# Patient Record
Sex: Male | Born: 1970 | Race: White | Hispanic: Yes | Marital: Married | Smoking: Former smoker
Health system: Southern US, Community
[De-identification: ages and names within clinical notes are randomized; demographics above are authoritative.]

## PROBLEM LIST (undated history)

## (undated) DIAGNOSIS — H35039 Hypertensive retinopathy, unspecified eye: Secondary | ICD-10-CM

## (undated) DIAGNOSIS — H269 Unspecified cataract: Secondary | ICD-10-CM

## (undated) DIAGNOSIS — E139 Other specified diabetes mellitus without complications: Secondary | ICD-10-CM

## (undated) DIAGNOSIS — I10 Essential (primary) hypertension: Secondary | ICD-10-CM

## (undated) DIAGNOSIS — N189 Chronic kidney disease, unspecified: Secondary | ICD-10-CM

## (undated) DIAGNOSIS — E11319 Type 2 diabetes mellitus with unspecified diabetic retinopathy without macular edema: Secondary | ICD-10-CM

## (undated) HISTORY — DX: Unspecified cataract: H26.9

## (undated) HISTORY — DX: Type 2 diabetes mellitus with unspecified diabetic retinopathy without macular edema: E11.319

## (undated) HISTORY — DX: Hypertensive retinopathy, unspecified eye: H35.039

## (undated) HISTORY — DX: Essential (primary) hypertension: I10

## (undated) HISTORY — PX: MULTIPLE TOOTH EXTRACTIONS: SHX2053

## (undated) HISTORY — DX: Other specified diabetes mellitus without complications: E13.9

## (undated) HISTORY — DX: Chronic kidney disease, unspecified: N18.9

## (undated) NOTE — *Deleted (*Deleted)
[facility] Note  01/31/2020     CHIEF COMPLAINT Patient presents for No chief complaint on file.   HISTORY OF PRESENT ILLNESS: Philip Wilson is a 50 y.o. male who presents to the clinic today for:   pt states no new health or vision concerns  Referring physician: Lillard Anes, MD [address] [place],  [place] [address]  HISTORICAL INFORMATION:   Selected notes from the MEDICAL RECORD NUMBER Referred by Dr. Ricki Sikkema for diabetic retinopathy OU    CURRENT MEDICATIONS: No current outpatient medications on file. (Ophthalmic Drugs)   Current Facility-Administered Medications (Ophthalmic Drugs)  Medication Route  . aflibercept (EYLEA) SOLN 2 mg Intravitreal  . aflibercept (EYLEA) SOLN 2 mg Intravitreal  . aflibercept (EYLEA) SOLN 2 mg Intravitreal  . aflibercept (EYLEA) SOLN 2 mg Intravitreal  . aflibercept (EYLEA) SOLN 2 mg Intravitreal  . aflibercept (EYLEA) SOLN 2 mg Intravitreal  . aflibercept (EYLEA) SOLN 2 mg Intravitreal  . aflibercept (EYLEA) SOLN 2 mg Intravitreal  . aflibercept (EYLEA) SOLN 2 mg Intravitreal  . aflibercept (EYLEA) SOLN 2 mg Intravitreal  . aflibercept (EYLEA) SOLN 2 mg Intravitreal   Current Outpatient Medications (Other)  Medication Sig  . ACCU-CHEK GUIDE test strip every morning. Use to test fasting blood glucose  . AgaMatrix Ultra-Thin Lancets MISC 200 each by Misc.(Non-Drug; Combo Route) route 4 times daily. One touch delica  . aspirin 81 MG EC tablet Take by mouth.  Marland Kitchen atorvastatin (LIPITOR) 40 MG tablet TAKE 1 TABLET BY MOUTH DAILY  . B-D UF III MINI PEN NEEDLES 31G X 5 MM MISC   . Blood Glucose Monitoring Suppl (FIFTY50 GLUCOSE METER 2.0) w/Device KIT 1 each by Other route 4 times daily. Use as instructed one touch ultra  . cloNIDine (CATAPRES) 0.1 MG tablet Take 0.1 mg by mouth 2 (two) times daily.   . dapsone 25 MG tablet Take 50 mg by mouth daily.  . fluconazole (DIFLUCAN) 50  MG tablet   . furosemide (LASIX) 40 MG tablet Take 40 mg by mouth daily.  Marland Kitchen glimepiride (AMARYL) 4 MG tablet Take 4 mg by mouth daily with breakfast.   . HYDROcodone-acetaminophen (NORCO/VICODIN) 5-325 MG tablet Take 1 tablet by mouth every 4 (four) hours as needed for moderate pain.  . Insulin Glargine (BASAGLAR KWIKPEN) 100 UNIT/ML Inject into the skin.  Marland Kitchen insulin lispro (HUMALOG) 100 UNIT/ML injection Inject 5 units with breakfast and dinner and 4 units with lunch plus sliding scale as follows: BG 100-150 (1 unit); 151-200 (3 unit); 201-250 (4 unit); 251-300 (5 unit); 251-300 (6 unit); 351-400 (8 unit)  . K Phos Mono-Sod Phos Di & Mono (PHOSPHA 250 NEUTRAL) 155-852-130 MG TABS   . labetalol (NORMODYNE) 200 MG tablet Take 200 mg by mouth 3 (three) times daily.   Marland Kitchen lisinopril (PRINIVIL,ZESTRIL) 20 MG tablet Take 20 mg by mouth 2 (two) times a day.   Marland Kitchen LOKELMA 10 g PACK packet Take 1 packet by mouth daily.  . magnesium oxide (MAG-OX) 400 MG tablet Take 1 tablet by mouth 2 (two) times daily.  . metFORMIN (GLUCOPHAGE) 1000 MG tablet Take 1,000 mg by mouth daily.  (Patient not taking: Reported on 09/20/2019)  . metFORMIN (GLUCOPHAGE-XR) 500 MG 24 hr tablet Take 500 mg by mouth 2 (two) times daily.  . Multiple Vitamin (MULTIVITAMIN WITH MINERALS) TABS tablet Take 1 tablet by mouth daily.  . Multiple Vitamin (MULTIVITAMIN) capsule Take by mouth. (Patient not taking: Reported  on 09/20/2019)  . mycophenolate (MYFORTIC) 180 MG EC tablet Take by mouth.  Marland Kitchen NIFEdipine (PROCARDIA-XL/NIFEDICAL-XL) 30 MG 24 hr tablet Take 30 mg by mouth daily.  Marland Kitchen NOVOLOG FLEXPEN 100 UNIT/ML FlexPen   . oxyCODONE (OXY IR/ROXICODONE) 5 MG immediate release tablet Take 5 mg by mouth every 4 (four) hours.  . pantoprazole (PROTONIX) 40 MG tablet Take by mouth.  . predniSONE (DELTASONE) 5 MG tablet Take by mouth.  . sodium bicarbonate 650 MG tablet Take by mouth.  . sulfamethoxazole-trimethoprim (BACTRIM) 400-80 MG tablet   .  Tacrolimus ER 1 MG TB24 Take by mouth.  . valGANciclovir (VALCYTE) 450 MG tablet Take by mouth.   Current Facility-Administered Medications (Other)  Medication Route  . Bevacizumab (AVASTIN) SOLN 1.25 mg Intravitreal  . Bevacizumab (AVASTIN) SOLN 1.25 mg Intravitreal  . Bevacizumab (AVASTIN) SOLN 1.25 mg Intravitreal  . Bevacizumab (AVASTIN) SOLN 1.25 mg Intravitreal  . Bevacizumab (AVASTIN) SOLN 1.25 mg Intravitreal  . Bevacizumab (AVASTIN) SOLN 1.25 mg Intravitreal  . Bevacizumab (AVASTIN) SOLN 1.25 mg Intravitreal      REVIEW OF SYSTEMS:    ALLERGIES No Known Allergies  PAST MEDICAL HISTORY Past Medical History:  Diagnosis Date  . Cataract    OU  . Chronic kidney disease 05/04/2018  . Diabetes 1.5, managed as type 2 ([place])   . Diabetic retinopathy ([place])    NPDR OU  . Hypertension   . Hypertensive retinopathy    OU   Past Surgical History:  Procedure Laterality Date  . AV FISTULA PLACEMENT Left 09/28/2018   Procedure: Creation of Left arm Radiocephalic ARTERIOVENOUS (AV) FISTULA;  Surgeon: Marty Heck, MD;  Location: [facility];  Service: Vascular;  Laterality: Left;  Marland Kitchen MULTIPLE TOOTH EXTRACTIONS      FAMILY HISTORY Family History  Problem Relation Age of Onset  . Heart failure Mother     SOCIAL HISTORY Social History   Tobacco Use  . Smoking status: Former Research scientist (life sciences)  . Smokeless tobacco: Never Used  . Tobacco comment: quit 20 years ago  Vaping Use  . Vaping Use: Never used  Substance Use Topics  . Alcohol use: Not Currently  . Drug use: Never         OPHTHALMIC EXAM:  Not recorded     IMAGING AND PROCEDURES  Imaging and Procedures for @TODAY @           ASSESSMENT/PLAN:    ICD-10-CM   1. Severe nonproliferative diabetic retinopathy of both eyes with macular edema associated with type 2 diabetes mellitus ([place])  [id number]   2. Retinal edema  H35.81 OCT, Retina - OU - Both Eyes  3. Essential hypertension  I10   4. Hypertensive  retinopathy of both eyes  H35.033   5. Combined forms of age-related cataract of both eyes  H25.813     1, 2. Severe Non-proliferative diabetic retinopathy, both eyes  - delayed follow up from Nov 2020 to April 2021 - 5 months instead of 4 weeks due to having a kidney transplant in December 2020  - initial exam showed massive central DME OU and scattered IRH and IRMA  - FA 6.5.19 with patches of capillary nonperfusion; extensive Mas with late leakage OU; no frank NV  - FA 01.02.20, shows improvement in late leaking microaneurysms OU  - S/P IVA OS #1 (06.05.19), #2 (07.08.19), #3 (07.08.19), #4 (09.03.19)  - S/P IVA OD #1 (06.07.19), #2 (07.08.19), #3 (07.08.19)  - S/P IVE OD #1 (09.03.19), #2 (10.03.19), #3 (11.04.19), #4 (12.02.19), #5 (  01.02.20), #6 (02.07.20), #7 (03.16.20), #8 (05.20.20), #9 (06.22.20), #10 (07.27.20), #11(09.09.10), # 12 (10.28.20), #13 (11.25.20), #14 (04.06.21) - sample, #15 (05.04.21) - sample, #16 (06.18.21), #17 (07.30.21), #18 (09.10.21)  - S/P IVE OS #1 (10.03.19), #2 (11.04.19), #3 (12.02.19), #4 (01.02.20), #5 (02.07.20), #6 (03.16.20), #7 (05.20.20), #8 (06.22.20),#9 (07.27.20), #10 (09.09.20), #11 (10.28.20), #12 (11.25.20)  - OCT shows OD shows persistent IRF, +ERM; OS: mild interval improvement in IRF ST macula  - BCVA: OD 20/40 (down from 20/30),  OS 20/25  - recommend IVE OD #19 today, 10.29.21, w/ dec in interval to 4-5 wks   - will hold off on injection OS again today -- IRF improving without therapy and BCVA 20/25  - pt in agreement  - RBA of procedure discussed, questions answered  - informed consent obtained and signed  - see procedure note -- tolerated well  - E[facility]a paperwork and benefits investigation started on 08.05.19 -- approved for 2021   - f/u in 4-5 weeks -- DFE/OCT/possible IVE  3,4. Hypertensive retinopathy OU  - discussed importance of tight BP control  - had some Bps in hypertensive emergency range (200s / 110s)  - BP improved post  kidney transplant  - discussed likely contribution to DME  - monitor             - BP management per nephrology, PCP and cardiology  5. Combined form age-related cataract OU-   - The symptoms of cataract, surgical options, and treatments and risks were discussed with patient.  - discussed diagnosis and progression  - not yet visually significant  - monitor for now   Ophthalmic Meds Ordered this visit:  No orders of the defined types were placed in this encounter.      No follow-ups on file.  There are no Patient Instructions on file for this visit.   Explained the diagnoses, plan, and follow up with the patient and they expressed understanding.  Patient expressed understanding of the importance of proper follow up care.   This document serves as a record of services personally performed by Gardiner Sleeper, MD, PhD. It was created on their behalf by San Jetty. Owens Shark, OA an ophthalmic technician. The creation of this record is the provider's dictation and/or activities during the visit.    Electronically signed by: San Jetty. Owens Shark, [place] 10.25.2021 12:48 PM  Gardiner Sleeper, M.D., Ph.D. Diseases & Surgery of the Retina and Vitreous [facility]: M myopia (nearsighted); A astigmatism; H hyperopia (farsighted); P presbyopia; Mrx spectacle prescription;  CTL contact lenses; OD right eye; OS left eye; OU both eyes  XT exotropia; ET esotropia; PEK punctate epithelial keratitis; PEE punctate epithelial erosions; DES dry eye syndrome; MGD meibomian gland dysfunction; ATs artificial tears; PFAT's preservative free artificial tears; [place] nuclear sclerotic cataract; PSC posterior subcapsular cataract; ERM epi-retinal membrane; PVD posterior vitreous detachment; RD retinal detachment; DM diabetes mellitus; DR diabetic retinopathy; NPDR non-proliferative diabetic retinopathy; PDR proliferative diabetic retinopathy; CSME clinically significant macular edema; DME  diabetic macular edema; dbh dot blot hemorrhages; CWS cotton wool spot; POAG primary open angle glaucoma; C/D cup-to-disc ratio; HVF humphrey visual field; GVF goldmann visual field; OCT optical coherence tomography; IOP intraocular pressure; BRVO Branch retinal vein occlusion; CRVO central retinal vein occlusion; CRAO central retinal artery occlusion; BRAO branch retinal artery occlusion; RT retinal tear; SB scleral buckle; PPV pars plana vitrectomy; VH Vitreous hemorrhage; PRP panretinal laser photocoagulation; IVK intravitreal kenalog; VMT vitreomacular traction; MH Macular  hole;  NVD neovascularization of the disc; NVE neovascularization elsewhere; AREDS age related eye disease study; ARMD age related macular degeneration; POAG primary open angle glaucoma; EBMD epithelial/anterior basement membrane dystrophy; ACIOL anterior chamber intraocular lens; IOL intraocular lens; PCIOL posterior chamber intraocular lens; Phaco/IOL phacoemulsification with intraocular lens placement; [place] photorefractive keratectomy; LASIK laser assisted in situ keratomileusis; HTN hypertension; DM diabetes mellitus; COPD chronic obstructive pulmonary disease

## (undated) NOTE — *Deleted (*Deleted)
[facility] Note  01/17/2020     CHIEF COMPLAINT Patient presents for No chief complaint on file.   HISTORY OF PRESENT ILLNESS: Philip Wilson is a 85 y.o. male who presents to the clinic today for:   pt states no new health or vision concerns  Referring physician: Lillard Anes, MD [address] [place],  [place] [address]  HISTORICAL INFORMATION:   Selected notes from the MEDICAL RECORD NUMBER Referred by Dr. Ricki Morimoto for diabetic retinopathy OU    CURRENT MEDICATIONS: No current outpatient medications on file. (Ophthalmic Drugs)   Current Facility-Administered Medications (Ophthalmic Drugs)  Medication Route  . aflibercept (EYLEA) SOLN 2 mg Intravitreal  . aflibercept (EYLEA) SOLN 2 mg Intravitreal  . aflibercept (EYLEA) SOLN 2 mg Intravitreal  . aflibercept (EYLEA) SOLN 2 mg Intravitreal  . aflibercept (EYLEA) SOLN 2 mg Intravitreal  . aflibercept (EYLEA) SOLN 2 mg Intravitreal  . aflibercept (EYLEA) SOLN 2 mg Intravitreal  . aflibercept (EYLEA) SOLN 2 mg Intravitreal  . aflibercept (EYLEA) SOLN 2 mg Intravitreal  . aflibercept (EYLEA) SOLN 2 mg Intravitreal  . aflibercept (EYLEA) SOLN 2 mg Intravitreal   Current Outpatient Medications (Other)  Medication Sig  . ACCU-CHEK GUIDE test strip every morning. Use to test fasting blood glucose  . AgaMatrix Ultra-Thin Lancets MISC 200 each by Misc.(Non-Drug; Combo Route) route 4 times daily. One touch delica  . aspirin 81 MG EC tablet Take by mouth.  Marland Kitchen atorvastatin (LIPITOR) 40 MG tablet TAKE 1 TABLET BY MOUTH DAILY  . B-D UF III MINI PEN NEEDLES 31G X 5 MM MISC   . Blood Glucose Monitoring Suppl (FIFTY50 GLUCOSE METER 2.0) w/Device KIT 1 each by Other route 4 times daily. Use as instructed one touch ultra  . cloNIDine (CATAPRES) 0.1 MG tablet Take 0.1 mg by mouth 2 (two) times daily.   . dapsone 25 MG tablet Take 50 mg by mouth daily.  . fluconazole (DIFLUCAN) 50  MG tablet   . furosemide (LASIX) 40 MG tablet Take 40 mg by mouth daily.  Marland Kitchen glimepiride (AMARYL) 4 MG tablet Take 4 mg by mouth daily with breakfast.   . HYDROcodone-acetaminophen (NORCO/VICODIN) 5-325 MG tablet Take 1 tablet by mouth every 4 (four) hours as needed for moderate pain.  . Insulin Glargine (BASAGLAR KWIKPEN) 100 UNIT/ML Inject into the skin.  Marland Kitchen insulin lispro (HUMALOG) 100 UNIT/ML injection Inject 5 units with breakfast and dinner and 4 units with lunch plus sliding scale as follows: BG 100-150 (1 unit); 151-200 (3 unit); 201-250 (4 unit); 251-300 (5 unit); 251-300 (6 unit); 351-400 (8 unit)  . K Phos Mono-Sod Phos Di & Mono (PHOSPHA 250 NEUTRAL) 155-852-130 MG TABS   . labetalol (NORMODYNE) 200 MG tablet Take 200 mg by mouth 3 (three) times daily.   Marland Kitchen lisinopril (PRINIVIL,ZESTRIL) 20 MG tablet Take 20 mg by mouth 2 (two) times a day.   Marland Kitchen LOKELMA 10 g PACK packet Take 1 packet by mouth daily.  . magnesium oxide (MAG-OX) 400 MG tablet Take 1 tablet by mouth 2 (two) times daily.  . metFORMIN (GLUCOPHAGE) 1000 MG tablet Take 1,000 mg by mouth daily.  (Patient not taking: Reported on 09/20/2019)  . metFORMIN (GLUCOPHAGE-XR) 500 MG 24 hr tablet Take 500 mg by mouth 2 (two) times daily.  . Multiple Vitamin (MULTIVITAMIN WITH MINERALS) TABS tablet Take 1 tablet by mouth daily.  . Multiple Vitamin (MULTIVITAMIN) capsule Take by mouth. (Patient not taking: Reported  on 09/20/2019)  . mycophenolate (MYFORTIC) 180 MG EC tablet Take by mouth.  Marland Kitchen NIFEdipine (PROCARDIA-XL/NIFEDICAL-XL) 30 MG 24 hr tablet Take 30 mg by mouth daily.  Marland Kitchen NOVOLOG FLEXPEN 100 UNIT/ML FlexPen   . oxyCODONE (OXY IR/ROXICODONE) 5 MG immediate release tablet Take 5 mg by mouth every 4 (four) hours.  . pantoprazole (PROTONIX) 40 MG tablet Take by mouth.  . predniSONE (DELTASONE) 5 MG tablet Take by mouth.  . sodium bicarbonate 650 MG tablet Take by mouth.  . sulfamethoxazole-trimethoprim (BACTRIM) 400-80 MG tablet   .  Tacrolimus ER 1 MG TB24 Take by mouth.  . valGANciclovir (VALCYTE) 450 MG tablet Take by mouth.   Current Facility-Administered Medications (Other)  Medication Route  . Bevacizumab (AVASTIN) SOLN 1.25 mg Intravitreal  . Bevacizumab (AVASTIN) SOLN 1.25 mg Intravitreal  . Bevacizumab (AVASTIN) SOLN 1.25 mg Intravitreal  . Bevacizumab (AVASTIN) SOLN 1.25 mg Intravitreal  . Bevacizumab (AVASTIN) SOLN 1.25 mg Intravitreal  . Bevacizumab (AVASTIN) SOLN 1.25 mg Intravitreal  . Bevacizumab (AVASTIN) SOLN 1.25 mg Intravitreal      REVIEW OF SYSTEMS:    ALLERGIES No Known Allergies  PAST MEDICAL HISTORY Past Medical History:  Diagnosis Date  . Cataract    OU  . Chronic kidney disease 05/04/2018  . Diabetes 1.5, managed as type 2 ([place])   . Diabetic retinopathy ([place])    NPDR OU  . Hypertension   . Hypertensive retinopathy    OU   Past Surgical History:  Procedure Laterality Date  . AV FISTULA PLACEMENT Left 09/28/2018   Procedure: Creation of Left arm Radiocephalic ARTERIOVENOUS (AV) FISTULA;  Surgeon: Marty Heck, MD;  Location: [facility];  Service: Vascular;  Laterality: Left;  Marland Kitchen MULTIPLE TOOTH EXTRACTIONS      FAMILY HISTORY Family History  Problem Relation Age of Onset  . Heart failure Mother     SOCIAL HISTORY Social History   Tobacco Use  . Smoking status: Former Research scientist (life sciences)  . Smokeless tobacco: Never Used  . Tobacco comment: quit 20 years ago  Vaping Use  . Vaping Use: Never used  Substance Use Topics  . Alcohol use: Not Currently  . Drug use: Never         OPHTHALMIC EXAM:  Not recorded     IMAGING AND PROCEDURES  Imaging and Procedures for @TODAY @           ASSESSMENT/PLAN:    ICD-10-CM   1. Severe nonproliferative diabetic retinopathy of both eyes with macular edema associated with type 2 diabetes mellitus ([facility])  [id number]   2. Retinal edema  H35.81 OCT, Retina - OU - Both Eyes  3. Essential hypertension  I10   4. Hypertensive  retinopathy of both eyes  H35.033   5. Combined forms of age-related cataract of both eyes  H25.813     1, 2. Severe Non-proliferative diabetic retinopathy, both eyes  - delayed follow up from Nov 2020 to April 2021 - 5 months instead of 4 weeks due to having a kidney transplant in December 2020  - initial exam showed massive central DME OU and scattered IRH and IRMA  - FA 6.5.19 with patches of capillary nonperfusion; extensive Mas with late leakage OU; no frank NV  - FA 01.02.20, shows improvement in late leaking microaneurysms OU  - S/P IVA OS #1 (06.05.19), #2 (07.08.19), #3 (07.08.19), #4 (09.03.19)  - S/P IVA OD #1 (06.07.19), #2 (07.08.19), #3 (07.08.19)  - S/P IVE OD #1 (09.03.19), #2 (10.03.19), #3 (11.04.19), #4 (12.02.19), #5 (  01.02.20), #6 (02.07.20), #7 (03.16.20), #8 (05.20.20), #9 (06.22.20), #10 (07.27.20), #11(09.09.10), # 12 (10.28.20), #13 (11.25.20), #14 (04.06.21) - sample, #15 (05.04.21) - sample, #16 (06.18.21), #17 (07.30.21), #18 (09.10.21)  - S/P IVE OS #1 (10.03.19), #2 (11.04.19), #3 (12.02.19), #4 (01.02.20), #5 (02.07.20), #6 (03.16.20), #7 (05.20.20), #8 (06.22.20),#9 (07.27.20), #10 (09.09.20), #11 (10.28.20), #12 (11.25.20)  - OCT shows OD shows persistent IRF, +ERM; OS: mild interval improvement in IRF ST macula  - BCVA: OD 20/40 (down from 20/30),  OS 20/25  - recommend IVE OD #19 today, 10.15.21, w/ dec in interval to 4-5 wks   - will hold off on injection OS again today -- IRF improving without therapy and BCVA 20/25  - pt in agreement  - RBA of procedure discussed, questions answered  - informed consent obtained and signed  - see procedure note -- tolerated well  - E[facility] paperwork and benefits investigation started on 08.05.19 -- approved for 2021   - f/u in 4-5 weeks -- DFE/OCT/possible IVE  3,4. Hypertensive retinopathy OU  - discussed importance of tight BP control  - had some Bps in hypertensive emergency range (200s / 110s)  - BP improved post  kidney transplant  - discussed likely contribution to DME  - monitor             - BP management per nephrology, PCP and cardiology  5. Combined form age-related cataract OU-   - The symptoms of cataract, surgical options, and treatments and risks were discussed with patient.  - discussed diagnosis and progression  - not yet visually significant  - monitor for now   Ophthalmic Meds Ordered this visit:  No orders of the defined types were placed in this encounter.      No follow-ups on file.  There are no Patient Instructions on file for this visit.   Explained the diagnoses, plan, and follow up with the patient and they expressed understanding.  Patient expressed understanding of the importance of proper follow up care.   This document serves as a record of services personally performed by Gardiner Sleeper, MD, PhD. It was created on their behalf by San Jetty. Owens Shark, OA an ophthalmic technician. The creation of this record is the provider's dictation and/or activities during the visit.    Electronically signed by: San Jetty. Owens Shark, [place] 10.12.2021 2:36 PM  Gardiner Sleeper, M.D., Ph.D. Diseases & Surgery of the Retina and Vitreous [facility]: M myopia (nearsighted); A astigmatism; H hyperopia (farsighted); P presbyopia; Mrx spectacle prescription;  CTL contact lenses; OD right eye; OS left eye; OU both eyes  XT exotropia; ET esotropia; PEK punctate epithelial keratitis; PEE punctate epithelial erosions; DES dry eye syndrome; MGD meibomian gland dysfunction; ATs artificial tears; PFAT's preservative free artificial tears; [place] nuclear sclerotic cataract; PSC posterior subcapsular cataract; ERM epi-retinal membrane; PVD posterior vitreous detachment; RD retinal detachment; DM diabetes mellitus; DR diabetic retinopathy; NPDR non-proliferative diabetic retinopathy; PDR proliferative diabetic retinopathy; CSME clinically significant macular edema; DME  diabetic macular edema; dbh dot blot hemorrhages; CWS cotton wool spot; POAG primary open angle glaucoma; C/D cup-to-disc ratio; HVF humphrey visual field; GVF goldmann visual field; OCT optical coherence tomography; IOP intraocular pressure; BRVO Branch retinal vein occlusion; CRVO central retinal vein occlusion; CRAO central retinal artery occlusion; BRAO branch retinal artery occlusion; RT retinal tear; SB scleral buckle; PPV pars plana vitrectomy; VH Vitreous hemorrhage; PRP panretinal laser photocoagulation; IVK intravitreal kenalog; VMT vitreomacular traction; MH Macular  hole;  NVD neovascularization of the disc; NVE neovascularization elsewhere; AREDS age related eye disease study; ARMD age related macular degeneration; POAG primary open angle glaucoma; EBMD epithelial/anterior basement membrane dystrophy; ACIOL anterior chamber intraocular lens; IOL intraocular lens; PCIOL posterior chamber intraocular lens; Phaco/IOL phacoemulsification with intraocular lens placement; [place] photorefractive keratectomy; LASIK laser assisted in situ keratomileusis; HTN hypertension; DM diabetes mellitus; COPD chronic obstructive pulmonary disease

---

## 2017-09-05 NOTE — Progress Notes (Addendum)
[facility] Note  09/06/2017     CHIEF COMPLAINT Patient presents for Retina Evaluation and Diabetic Eye Exam   HISTORY OF PRESENT ILLNESS: Philip Wilson is a 47 y.o. male who presents to the clinic today for:   HPI    Retina Evaluation    In both eyes.  This started 2 months ago.  Associated Symptoms Floaters.  Negative for Blind Spot, Glare, Shoulder/Hip pain, Fatigue, Jaw Claudication, Photophobia, Distortion, Redness, Scalp Tenderness, Weight Loss, Flashes, Pain, Trauma and Fever.  Context:  distance vision, mid-range vision and near vision.  Treatments tried include no treatments.  I, the attending physician,  performed the HPI with the patient and updated documentation appropriately.          Diabetic Eye Exam    Vision fluctuates with blood sugars.  Associated Symptoms Flashes and Pain.  Negative for Blind Spot, Glare, Shoulder/Hip pain, Fatigue, Jaw Claudication, Photophobia, Distortion, Floaters, Redness, Scalp Tenderness, Weight Loss, Fever and Trauma.  Diabetes characteristics include Type 2.  This started 2 years ago.  Blood sugar level fluctuates.  Last Blood Glucose 95.  Associated Diagnosis Neuropathy.  I, the attending physician,  performed the HPI with the patient and updated documentation appropriately.          Comments    Referral of Dr. Samara Snide for DME. Patient states he had constant floaters Ou for many years "I just ignored them". Pt states Dr. Oren Beckmann told him he had blood behind his eye(unsure of which eye).Denies flashes and ocular pain. Pt has been DM2 x 2 yrs, he was DX with diabetes when he was dx with neuropathy, Bs fluctuates and are controlled by Metformin and glipizide. BS 95 this am. Last A1C 6 one month ago. Denies vit's/gtt's       Last edited by Bernarda Caffey, MD on 09/06/2017  1:12 PM. (History)    Pt states he was referred by Dr. Samara Snide for "some bleeding in my eyes"; Pt states he was seeing Dr. Samara Snide for a routine  exam; Pt states he has noticed a decrease in [place] x 4-6 months; Pt states he has noticed more trouble at near; Pt states CBG has been stable, last A1C was 6; Pt states before he was dx his A1C was 13; Pt states he "goes to get my eyes checked every year and this time my eye doctor saw something";   Referring physician: Phylliss Blakes, OD [address] [place], [place] [address]  HISTORICAL INFORMATION:   Selected notes from the MEDICAL RECORD NUMBER Referred by Dr. Ricki Coombs for diabetic retinopathy OU LEE: 05.28.19 (A. Hager) [BCVA: OD: 20/40 OS: 20/50+2] Ocular Hx-Diabetic Retinopathy PMH-DM    CURRENT MEDICATIONS: No current outpatient medications on file. (Ophthalmic Drugs)   No current facility-administered medications for this visit.  (Ophthalmic Drugs)   Current Outpatient Medications (Other)  Medication Sig  . glimepiride (AMARYL) 4 MG tablet Take by mouth.  Marland Kitchen lisinopril-hydrochlorothiazide (PRINZIDE,ZESTORETIC) 20-12.5 MG tablet   . metFORMIN (GLUCOPHAGE) 1000 MG tablet TK 1 T PO BID   Current Facility-Administered Medications (Other)  Medication Route  . Bevacizumab (AVASTIN) SOLN 1.25 mg Intravitreal      REVIEW OF SYSTEMS: ROS    Positive for: Endocrine, Eyes   Negative for: Constitutional, Gastrointestinal, Neurological, Skin, Genitourinary, Musculoskeletal, HENT, Cardiovascular, Respiratory, Psychiatric, Allergic/Imm, Heme/Lymph   Last edited by Zenovia Jordan, LPN on 10/02/6965  8:93 AM. (History)       ALLERGIES No Known Allergies  PAST MEDICAL HISTORY Past Medical History:  Diagnosis Date  . Diabetes 1.5, managed as type 2 ([place])   . Hypertension    History reviewed. No pertinent surgical history.  FAMILY HISTORY Family History  Problem Relation Age of Onset  . Heart failure Mother     SOCIAL HISTORY Social History   Tobacco Use  . Smoking status: Former Research scientist (life sciences)  . Smokeless tobacco: Never Used  Substance Use Topics  . Alcohol use: Yes     Alcohol/week: 0.6 oz    Types: 1 Cans of beer per week  . Drug use: Never         OPHTHALMIC EXAM:  Base Eye Exam    Visual Acuity (Snellen - Linear)      Right Left   Dist cc 20/40 20/80   Dist ph cc NI NI   Correction:  Glasses       Tonometry (Tonopen, 9:00 AM)      Right Left   Pressure 18 19       Pupils      Dark Light Shape React APD   Right 3 2 Round Brisk None   Left 3 2 Round Brisk None       Visual Fields (Counting fingers)      Left Right    Full Full       Extraocular Movement      Right Left    Full, Ortho Full, Ortho       Neuro/Psych    Oriented x3:  Yes   Mood/Affect:  Normal       Dilation    Both eyes:  1.0% Mydriacyl, Paremyd @ 9:00 AM        Slit Lamp and Fundus Exam    Slit Lamp Exam      Right Left   Lids/Lashes Dermatochalasis - upper lid, mild Meibomian gland dysfunction Dermatochalasis - upper lid, mild Meibomian gland dysfunction   Conjunctiva/Sclera White and quiet White and quiet   Cornea Clear Clear   Anterior Chamber Deep and quiet, no cell or flare Deep and quiet, no cell or flare   Iris Round and dilated to 24mm Round and dilated to 65mm   Lens 1+ Nuclear sclerosis, 2+ Cortical cataract 1+ Nuclear sclerosis, 2+ Cortical cataract   Vitreous Mild Vitreous syneresis Mild Vitreous syneresis, old white VH inferiorly,       Fundus Exam      Right Left   Disc Pink and Sharp Pink and Sharp   C/D Ratio 0.2 0.2   Macula Blunted foveal reflex, central edema, Microaneurysms, Exudates, scattered Intraretinal hemorrhage Blunted foveal reflex, central edema with striae, scattered Microaneurysms, scattered Intraretinal hemorrhage   Vessels Tortuous, AV crossing changes, ? NV inferior to arcades Tortuous, AV crossing changes   Periphery Attached, scattered MAs and DBH - mostly posterior, scattered CWS Attached, scattered MAs and DBH -- mostly posterior          IMAGING AND PROCEDURES  Imaging and Procedures for @TODAY @  OCT,  Retina - OU - Both Eyes       Right Eye Quality was good. Central Foveal Thickness: 558. Progression has no prior data. Findings include abnormal foveal contour, subretinal fluid, intraretinal fluid.   Left Eye Quality was good. Central Foveal Thickness: 816. Progression has no prior data. Findings include abnormal foveal contour, intraretinal fluid, subretinal fluid.   Notes *Images captured and stored on drive  Diagnosis / Impression:  Massive DME OU  Clinical management:  See below  Abbreviations: NFP - Normal foveal profile. CME - cystoid macular edema. PED - pigment epithelial detachment. IRF - intraretinal fluid. SRF - subretinal fluid. EZ - ellipsoid zone. ERM - epiretinal membrane. ORA - outer retinal atrophy. ORT - outer retinal tubulation. SRHM - subretinal hyper-reflective material        Fluorescein Angiography Optos (Transit OS)       Right Eye Progression has no prior data. Early phase findings include microaneurysm, vascular perfusion defect, leakage (IRMA). Mid/Late phase findings include leakage, microaneurysm, vascular perfusion defect (IRMA).   Left Eye Progression has no prior data. Early phase findings include microaneurysm, vascular perfusion defect, leakage (IRMA). Mid/Late phase findings include leakage, microaneurysm, vascular perfusion defect (IRMA).   Notes Images stored on drive;   Impression: Severe NPDR OU OD: extensive leaking microaneurysms OS: extensive microaneurysms with leakage, hyperfluorescence of disc No frank NV OU         Intravitreal Injection, Pharmacologic Agent - OS - Left Eye       Time Out 09/06/2017. 10:36 AM. Confirmed correct patient, procedure, site, and patient consented.   Anesthesia Topical anesthesia was used. Anesthetic medications included Tetracaine 0.5%, Lidocaine 2%.   Procedure Preparation included 5% betadine to ocular surface, eyelid speculum. A supplied needle was used.   Injection: 1.25 mg  Bevacizumab 1.25mg /0.58ml   NDC: 14431-540-08    Lot: [id number]    Expiration Date: 10/25/2017   Route: Intravitreal   Site: Left Eye   Waste: 0 mg  Post-op Post injection exam found visual acuity of at least counting fingers. The patient tolerated the procedure well. There were no complications. The patient received written and verbal post procedure care education.                 ASSESSMENT/PLAN:    ICD-10-CM   1. 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 Retinal edema H35.81 OCT, Retina - OU - Both Eyes    Fluorescein Angiography Optos (Transit OS)  3. Essential hypertension I10   4. Hypertensive retinopathy of both eyes H35.033   5. 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 Severe Non-proliferative diabetic retinopathy, both eyes - The incidence, risk factors for progression, natural history and treatment options for diabetic retinopathy  were discussed with patient.   - The need for close monitoring of blood glucose, blood pressure, and serum lipids, avoiding cigarette or any type of tobacco, and the need for long term follow up was also discussed with patient. - exam shows massive central DME OU and scattered IRH and IRMA - FA with patches of capillary nonperfusion; extensive Mas with late leakage OU - OCT shows diabetic macular edema, both eyes (OS > OD) The natural history, pathology, and characteristics of diabetic macular edema discussed with patient.  A generalized discussion of the major clinical trials concerning treatment of diabetic macular edema (ETDRS, DCT, SCORE, RISE / RIDE, and ongoing DRCR net studies) was completed.  This discussion included mention of the various approaches to treating diabetic macular edema (observation, laser  photocoagulation, anti-VEGF injections with lucentis / Avastin / Eylea, steroid injections with Kenalog / Ozurdex, and intraocular surgery with vitrectomy).  The goal hemoglobin A1C of 6-7 was discussed, as well as importance of smoking cessation and hypertension control.  Need for ongoing treatment and monitoring  were specifically discussed with reference to chronic nature of diabetic macular edema.  - recommend IVA OU, OS first, OD ASAP - pt wishes to proceed - IVA #1 OS today, 06.05.19 - RBA of procedure discussed, questions answered - informed consent obtained and signed - see procedure note -- tolerated well - f/u in 2 days for OS IVA - then 4 week follow up - will plan for bilateral injections at subsequent visits   3,4. Hypertensive retinopathy OU - discussed importance of tight BP control - monitor  5. 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 - discussed diagnosis and progression - not yet visually significant - monitor for now   Ophthalmic Meds Ordered this visit:  Meds ordered this encounter  Medications  . Bevacizumab (AVASTIN) SOLN 1.25 mg       Return in about 2 days (around 09/08/2017) for IVA OD.  There are no Patient Instructions on file for this visit.   Explained the diagnoses, plan, and follow up with the patient and they expressed understanding.  Patient expressed understanding of the importance of proper follow up care.   This document serves as a record of services personally performed by Gardiner Sleeper, MD, PhD. It was created on their behalf by Ernest Mallick, OA, an ophthalmic assistant. The creation of this record is the provider's dictation and/or activities during the visit.    Electronically signed by: Ernest Mallick, OA  06.04.2019 3:22 PM   This document serves as a record of services personally performed by Gardiner Sleeper, MD, PhD. It was created on  their behalf by Catha Brow, [place], a certified ophthalmic assistant. The creation of this record is the provider's dictation and/or activities during the visit.  Electronically signed by: Catha Brow, [place]  06.05.19 3:22 PM     Gardiner Sleeper, M.D., Ph.D. Diseases & Surgery of the Retina and Vitreous [facility]  I have reviewed the above documentation for accuracy and completeness, and I agree with the above. Gardiner Sleeper, M.D., Ph.D. 09/07/17 3:22 PM     Abbreviations: M myopia (nearsighted); A astigmatism; H hyperopia (farsighted); P presbyopia; Mrx spectacle prescription;  CTL contact lenses; OD right eye; OS left eye; OU both eyes  XT exotropia; ET esotropia; PEK punctate epithelial keratitis; PEE punctate epithelial erosions; DES dry eye syndrome; MGD meibomian gland dysfunction; ATs artificial tears; PFAT's preservative free artificial tears; [place] nuclear sclerotic cataract; PSC posterior subcapsular cataract; ERM epi-retinal membrane; PVD posterior vitreous detachment; RD retinal detachment; DM diabetes mellitus; DR diabetic retinopathy; NPDR non-proliferative diabetic retinopathy; PDR proliferative diabetic retinopathy; CSME clinically significant macular edema; DME diabetic macular edema; dbh dot blot hemorrhages; CWS cotton wool spot; POAG primary open angle glaucoma; C/D cup-to-disc ratio; HVF humphrey visual field; GVF goldmann visual field; OCT optical coherence tomography; IOP intraocular pressure; BRVO Branch retinal vein occlusion; CRVO central retinal vein occlusion; CRAO central retinal artery occlusion; BRAO branch retinal artery occlusion; RT retinal tear; SB scleral buckle; PPV pars plana vitrectomy; VH Vitreous hemorrhage; PRP panretinal laser photocoagulation; IVK intravitreal kenalog; VMT vitreomacular traction; MH Macular hole;  NVD neovascularization of the disc; NVE neovascularization elsewhere; AREDS age related eye disease study; ARMD age  related macular degeneration; POAG primary open angle glaucoma; EBMD epithelial/anterior basement membrane dystrophy; ACIOL anterior chamber intraocular lens; IOL intraocular lens; PCIOL posterior chamber intraocular lens; Phaco/IOL phacoemulsification with intraocular lens placement; PRK photorefractive keratectomy; LASIK laser  assisted in situ keratomileusis; HTN hypertension; DM diabetes mellitus; COPD chronic obstructive pulmonary disease

## 2017-09-06 ENCOUNTER — Encounter (INDEPENDENT_AMBULATORY_CARE_PROVIDER_SITE_OTHER): Payer: Self-pay | Admitting: Ophthalmology

## 2017-09-06 ENCOUNTER — Ambulatory Visit (INDEPENDENT_AMBULATORY_CARE_PROVIDER_SITE_OTHER): Payer: BC Managed Care – PPO | Admitting: Ophthalmology

## 2017-09-06 DIAGNOSIS — H3581 Retinal edema: Secondary | ICD-10-CM | POA: Diagnosis not present

## 2017-09-06 DIAGNOSIS — I10 Essential (primary) hypertension: Secondary | ICD-10-CM | POA: Diagnosis not present

## 2017-09-06 DIAGNOSIS — E113413 Type 2 diabetes mellitus with severe nonproliferative diabetic retinopathy with macular edema, bilateral: Secondary | ICD-10-CM

## 2017-09-06 DIAGNOSIS — H25813 Combined forms of age-related cataract, bilateral: Secondary | ICD-10-CM | POA: Diagnosis not present

## 2017-09-06 DIAGNOSIS — H35033 Hypertensive retinopathy, bilateral: Secondary | ICD-10-CM | POA: Diagnosis not present

## 2017-09-06 MED ORDER — BEVACIZUMAB CHEMO INJECTION 1.25MG/0.05ML SYRINGE FOR KALEIDOSCOPE
1.2500 mg | INTRAVITREAL | Status: AC
Start: 2017-09-06 — End: ?
  Administered 2017-09-06: 1.25 mg via INTRAVITREAL

## 2017-09-08 ENCOUNTER — Ambulatory Visit (INDEPENDENT_AMBULATORY_CARE_PROVIDER_SITE_OTHER): Payer: BC Managed Care – PPO | Admitting: Ophthalmology

## 2017-09-08 ENCOUNTER — Encounter (INDEPENDENT_AMBULATORY_CARE_PROVIDER_SITE_OTHER): Payer: Self-pay | Admitting: Ophthalmology

## 2017-09-08 DIAGNOSIS — H3581 Retinal edema: Secondary | ICD-10-CM

## 2017-09-08 DIAGNOSIS — E113413 Type 2 diabetes mellitus with severe nonproliferative diabetic retinopathy with macular edema, bilateral: Secondary | ICD-10-CM

## 2017-09-08 DIAGNOSIS — H35033 Hypertensive retinopathy, bilateral: Secondary | ICD-10-CM

## 2017-09-08 DIAGNOSIS — I10 Essential (primary) hypertension: Secondary | ICD-10-CM

## 2017-09-08 DIAGNOSIS — H25813 Combined forms of age-related cataract, bilateral: Secondary | ICD-10-CM

## 2017-09-08 MED ORDER — BEVACIZUMAB CHEMO INJECTION 1.25MG/0.05ML SYRINGE FOR KALEIDOSCOPE
1.2500 mg | INTRAVITREAL | Status: DC
  Administered 2017-09-08: 1.25 mg via INTRAVITREAL

## 2017-09-08 NOTE — Progress Notes (Signed)
[facility] Note  09/08/2017     CHIEF COMPLAINT Patient presents for Retina Follow Up   HISTORY OF PRESENT ILLNESS: Philip Wilson is a 47 y.o. male who presents to the clinic today for:   HPI    Retina Follow Up    In right eye.  This started 1 month ago.  Severity is mild.  Since onset it is stable.  I, the attending physician,  performed the HPI with the patient and updated documentation appropriately.          Comments    Pt arrived for IVA OD . Patient states vision is stable, no new issues. BS 84       Last edited by Bernarda Caffey, MD on 09/08/2017  9:29 AM. (History)    Here today for IVA OD  Referring physician: Lillard Anes, MD [id number] [place], [place] [address]  HISTORICAL INFORMATION:   Selected notes from the MEDICAL RECORD NUMBER Referred by Dr. Ricki Katt for diabetic retinopathy OU LEE: 05.28.19 (A. Hager) [BCVA: OD: 20/40 OS: 20/50+2] Ocular Hx-Diabetic Retinopathy PMH-DM    CURRENT MEDICATIONS: No current outpatient medications on file. (Ophthalmic Drugs)   No current facility-administered medications for this visit.  (Ophthalmic Drugs)   Current Outpatient Medications (Other)  Medication Sig  . glimepiride (AMARYL) 4 MG tablet Take by mouth.  Marland Kitchen lisinopril-hydrochlorothiazide (PRINZIDE,ZESTORETIC) 20-12.5 MG tablet   . metFORMIN (GLUCOPHAGE) 1000 MG tablet TK 1 T PO BID   Current Facility-Administered Medications (Other)  Medication Route  . Bevacizumab (AVASTIN) SOLN 1.25 mg Intravitreal  . Bevacizumab (AVASTIN) SOLN 1.25 mg Intravitreal      REVIEW OF SYSTEMS: ROS    Positive for: Endocrine, Eyes   Negative for: Constitutional, Gastrointestinal, Neurological, Skin, Genitourinary, Musculoskeletal, HENT, Respiratory, Psychiatric, Allergic/Imm, Heme/Lymph   Last edited by Zenovia Jordan, LPN on 07/06/9673  9:16 AM. (History)       ALLERGIES No Known Allergies  PAST MEDICAL  HISTORY Past Medical History:  Diagnosis Date  . Diabetes 1.5, managed as type 2 ([place])   . Hypertension    History reviewed. No pertinent surgical history.  FAMILY HISTORY Family History  Problem Relation Age of Onset  . Heart failure Mother     SOCIAL HISTORY Social History   Tobacco Use  . Smoking status: Former Research scientist (life sciences)  . Smokeless tobacco: Never Used  Substance Use Topics  . Alcohol use: Yes    Alcohol/week: 0.6 oz    Types: 1 Cans of beer per week  . Drug use: Never         OPHTHALMIC EXAM:  Base Eye Exam    Visual Acuity (Snellen - Linear)      Right Left   Dist [place] 20/40 +2 20/60 +2   Dist ph [place] NI NI       Tonometry (Tonopen, 8:57 AM)      Right Left   Pressure 15 23       Tonometry #2      Right Left   Pressure  18       Visual Fields      Left Right    Full Full       Neuro/Psych    Oriented x3:  Yes   Mood/Affect:  Normal       Dilation    Right eye:  1.0% Mydriacyl, 2.5% Phenylephrine @ 8:57 AM        Slit Lamp and Fundus Exam  Slit Lamp Exam      Right Left   Lids/Lashes Dermatochalasis - upper lid, mild Meibomian gland dysfunction Dermatochalasis - upper lid, mild Meibomian gland dysfunction   Conjunctiva/Sclera White and quiet White and quiet   Cornea Clear Clear   Anterior Chamber Deep and quiet, no cell or flare Deep and quiet, no cell or flare   Iris Round and dilated to 52mm Round and dilated to 52mm   Lens 1+ Nuclear sclerosis, 2+ Cortical cataract 1+ Nuclear sclerosis, 2+ Cortical cataract   Vitreous Mild Vitreous syneresis Mild Vitreous syneresis, old white VH inferiorly,       Fundus Exam      Right Left   Disc Pink and Sharp Pink and Sharp   C/D Ratio 0.2 0.2   Macula Blunted foveal reflex, central edema, Microaneurysms, Exudates, scattered Intraretinal hemorrhage Blunted foveal reflex, central edema with striae, scattered Microaneurysms, scattered Intraretinal hemorrhage   Vessels Tortuous, AV crossing changes, ?  NV inferior to arcades Tortuous, AV crossing changes   Periphery Attached, scattered MAs and DBH - mostly posterior, scattered CWS Attached, scattered MAs and DBH -- mostly posterior          IMAGING AND PROCEDURES  Imaging and Procedures for @TODAY @  Intravitreal Injection, Pharmacologic Agent - OD - Right Eye       Time Out 09/08/2017. 9:11 AM. Confirmed correct patient, procedure, site, and patient consented.   Anesthesia Topical anesthesia was used. Anesthetic medications included Lidocaine 2%, Tetracaine 0.5%.   Procedure Preparation included 5% betadine to ocular surface, eyelid speculum. A supplied needle was used.   Injection: 1.25 mg Bevacizumab 1.25mg /0.74ml   NDC: 71696-789-38    Lot: [id number]    Expiration Date: 10/25/2017   Route: Intravitreal   Site: Right Eye   Waste: 0 mg  Post-op Post injection exam found visual acuity of at least counting fingers. The patient tolerated the procedure well. There were no complications. The patient received written and verbal post procedure care education.                 ASSESSMENT/PLAN:    ICD-10-CM   1. Severe nonproliferative diabetic retinopathy of both eyes with macular edema associated with type 2 diabetes mellitus (HCC) C58.5277 Intravitreal Injection, Pharmacologic Agent - OD - Right Eye    Bevacizumab (AVASTIN) SOLN 1.25 mg  2. Retinal edema H35.81   3. Essential hypertension I10   4. Hypertensive retinopathy of both eyes H35.033   5. 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 Severe Non-proliferative diabetic retinopathy, both eyes - The incidence, risk factors for progression, natural history and treatment options for diabetic retinopathy  were discussed with patient.   - The need for close monitoring of blood glucose, blood pressure, and serum lipids, avoiding cigarette or any type of tobacco, and the need for long term follow up was also discussed with patient. - exam shows  massive central DME OU and scattered IRH and IRMA - FA with patches of capillary nonperfusion; extensive Mas with late leakage OU - OCT shows diabetic macular edema, both eyes (OS > OD) The natural history, pathology, and characteristics of diabetic macular edema discussed with patient.  A generalized discussion of the major clinical trials concerning treatment of diabetic macular edema (ETDRS, DCT, SCORE, RISE / RIDE, and ongoing DRCR net studies) was completed.  This discussion included mention of the various approaches to treating diabetic macular edema (observation, laser photocoagulation, anti-VEGF injections with lucentis /  Avastin / Eylea, steroid injections with Kenalog / Ozurdex, and intraocular surgery with vitrectomy).  The goal hemoglobin A1C of 6-7 was discussed, as well as importance of smoking cessation and hypertension control.  Need for ongoing treatment and monitoring were specifically discussed with reference to chronic nature of diabetic macular edema.  - recommend IVA OU - s/p IVA #1 OS 06.05.19 - here today for IVA #1 OD - RBA of procedure discussed, questions answered - informed consent obtained and signed - see procedure note -- tolerated well - f/u in 4 weeks - will plan for bilateral injections at subsequent visits   3,4. Hypertensive retinopathy OU - discussed importance of tight BP control - monitor  5. 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 - discussed diagnosis and progression - not yet visually significant - monitor for now   Ophthalmic Meds Ordered this visit:  Meds ordered this encounter  Medications  . Bevacizumab (AVASTIN) SOLN 1.25 mg       Return in about 1 month (around 10/06/2017) for f/u DME OU - Dilated Exam, OCT, Possible Injxn.  There are no Patient Instructions on file for this visit.   Explained the diagnoses, plan, and follow up with the  patient and they expressed understanding.  Patient expressed understanding of the importance of proper follow up care.   This document serves as a record of services personally performed by Gardiner Sleeper, MD, PhD. It was created on their behalf by Catha Brow, [place], a certified ophthalmic assistant. The creation of this record is the provider's dictation and/or activities during the visit.  Electronically signed by: Catha Brow, COA  06.06.19 9:37 AM   Gardiner Sleeper, M.D., Ph.D. Diseases & Surgery of the Retina and Vitreous [facility]  I have reviewed the above documentation for accuracy and completeness, and I agree with the above. Gardiner Sleeper, M.D., Ph.D. 09/08/17 9:37 AM     Abbreviations: M myopia (nearsighted); A astigmatism; H hyperopia (farsighted); P presbyopia; Mrx spectacle prescription;  CTL contact lenses; OD right eye; OS left eye; OU both eyes  XT exotropia; ET esotropia; PEK punctate epithelial keratitis; PEE punctate epithelial erosions; DES dry eye syndrome; MGD meibomian gland dysfunction; ATs artificial tears; PFAT's preservative free artificial tears; [place] nuclear sclerotic cataract; PSC posterior subcapsular cataract; ERM epi-retinal membrane; PVD posterior vitreous detachment; RD retinal detachment; DM diabetes mellitus; DR diabetic retinopathy; NPDR non-proliferative diabetic retinopathy; PDR proliferative diabetic retinopathy; CSME clinically significant macular edema; DME diabetic macular edema; dbh dot blot hemorrhages; CWS cotton wool spot; POAG primary open angle glaucoma; C/D cup-to-disc ratio; HVF humphrey visual field; GVF goldmann visual field; OCT optical coherence tomography; IOP intraocular pressure; BRVO Branch retinal vein occlusion; CRVO central retinal vein occlusion; CRAO central retinal artery occlusion; BRAO branch retinal artery occlusion; RT retinal tear; SB scleral buckle; PPV pars plana vitrectomy; VH Vitreous  hemorrhage; PRP panretinal laser photocoagulation; IVK intravitreal kenalog; VMT vitreomacular traction; MH Macular hole;  NVD neovascularization of the disc; NVE neovascularization elsewhere; AREDS age related eye disease study; ARMD age related macular degeneration; POAG primary open angle glaucoma; EBMD epithelial/anterior basement membrane dystrophy; ACIOL anterior chamber intraocular lens; IOL intraocular lens; PCIOL posterior chamber intraocular lens; Phaco/IOL phacoemulsification with intraocular lens placement; [place] photorefractive keratectomy; LASIK laser assisted in situ keratomileusis; HTN hypertension; DM diabetes mellitus; COPD chronic obstructive pulmonary disease

## 2017-10-04 NOTE — Progress Notes (Signed)
[facility] Note  10/09/2017     CHIEF COMPLAINT Patient presents for Retina Follow Up   HISTORY OF PRESENT ILLNESS: Philip Wilson is a 47 y.o. male who presents to the clinic today for:   HPI    Retina Follow Up    Patient presents with  Diabetic Retinopathy.  In both eyes.  Severity is mild.  Since onset it is gradually improving.  I, the attending physician,  performed the HPI with the patient and updated documentation appropriately.          Comments    F/U NPDR OU. Patient states his vision has improved, " I can see small print more clearly". Pt is ready for Avastin Ou if indicated. Bs 52 this am, Bs are usually stable per patient        Last edited by Bernarda Caffey, MD on 10/09/2017 10:28 AM. (History)    Pt states he has noticed an improvement in [place]; Pt states he is able to see smaller print now;   Referring physician: Lillard Anes, MD [address] [place], [place] [address]  HISTORICAL INFORMATION:   Selected notes from the MEDICAL RECORD NUMBER Referred by Dr. Ricki Habermehl for diabetic retinopathy OU LEE: 05.28.19 (A. Hager) [BCVA: OD: 20/40 OS: 20/50+2] Ocular Hx-Diabetic Retinopathy PMH-DM    CURRENT MEDICATIONS: No current outpatient medications on file. (Ophthalmic Drugs)   No current facility-administered medications for this visit.  (Ophthalmic Drugs)   Current Outpatient Medications (Other)  Medication Sig  . glimepiride (AMARYL) 4 MG tablet Take by mouth.  Marland Kitchen lisinopril-hydrochlorothiazide (PRINZIDE,ZESTORETIC) 20-12.5 MG tablet   . metFORMIN (GLUCOPHAGE) 1000 MG tablet TK 1 T PO BID   Current Facility-Administered Medications (Other)  Medication Route  . Bevacizumab (AVASTIN) SOLN 1.25 mg Intravitreal  . Bevacizumab (AVASTIN) SOLN 1.25 mg Intravitreal  . Bevacizumab (AVASTIN) SOLN 1.25 mg Intravitreal  . Bevacizumab (AVASTIN) SOLN 1.25 mg Intravitreal      REVIEW OF SYSTEMS: ROS     Positive for: Endocrine, Eyes   Negative for: Constitutional, Gastrointestinal, Neurological, Skin, Genitourinary, Musculoskeletal, HENT, Cardiovascular, Respiratory, Psychiatric, Allergic/Imm, Heme/Lymph   Last edited by Zenovia Jordan, LPN on 06/10/298  9:23 AM. (History)       ALLERGIES No Known Allergies  PAST MEDICAL HISTORY Past Medical History:  Diagnosis Date  . Diabetes 1.5, managed as type 2 ([place])   . Hypertension    History reviewed. No pertinent surgical history.  FAMILY HISTORY Family History  Problem Relation Age of Onset  . Heart failure Mother     SOCIAL HISTORY Social History   Tobacco Use  . Smoking status: Former Research scientist (life sciences)  . Smokeless tobacco: Never Used  Substance Use Topics  . Alcohol use: Yes    Alcohol/week: 0.6 oz    Types: 1 Cans of beer per week  . Drug use: Never         OPHTHALMIC EXAM:  Base Eye Exam    Visual Acuity (Snellen - Linear)      Right Left   Dist [place] 20/40 20/50   Dist ph [place] NI NI       Tonometry (Tonopen, 9:31 AM)      Right Left   Pressure 16 15       Pupils      Dark Light Shape React APD   Right 3 2 Round Brisk None   Left 3 2 Round Brisk None       Visual  Fields (Counting fingers)      Left Right    Full Full       Extraocular Movement      Right Left    Full, Ortho Full, Ortho       Neuro/Psych    Oriented x3:  Yes   Mood/Affect:  Normal       Dilation    Both eyes:  1.0% Mydriacyl, 2.5% Phenylephrine @ 9:29 AM       Dilation #2    Both eyes:  phenylephine 10% @ 9:30 AM        Slit Lamp and Fundus Exam    Slit Lamp Exam      Right Left   Lids/Lashes Dermatochalasis - upper lid, mild Meibomian gland dysfunction Dermatochalasis - upper lid, mild Meibomian gland dysfunction   Conjunctiva/Sclera White and quiet White and quiet   Cornea Clear Clear   Anterior Chamber Deep and quiet, no cell or flare Deep and quiet, no cell or flare   Iris Round and dilated to 79mm Round and dilated to 76mm    Lens 1+ Nuclear sclerosis, 2+ Cortical cataract 1+ Nuclear sclerosis, 2+ Cortical cataract   Vitreous Mild Vitreous syneresis Mild Vitreous syneresis, old white VH inferiorly,       Fundus Exam      Right Left   Disc Pink and Sharp Pink and Sharp, hyperemia   C/D Ratio 0.2 0.2   Macula Blunted foveal reflex, central edema -- improved, Microaneurysms, Exudates, scattered Intraretinal hemorrhage Blunted foveal reflex, central edema with striae -- improved edema, scattered Microaneurysms, scattered Intraretinal hemorrhage   Vessels Tortuous, AV crossing changes, ? NV inferior to arcades Tortuous, AV crossing changes   Periphery Attached, scattered MAs and DBH - mostly posterior, scattered CWS Attached, scattered MAs and DBH -- mostly posterior          IMAGING AND PROCEDURES  Imaging and Procedures for @TODAY @  OCT, Retina - OU - Both Eyes       Right Eye Quality was good. Central Foveal Thickness: 425. Progression has improved. Findings include abnormal foveal contour, subretinal fluid, intraretinal fluid, outer retinal atrophy (Interval improvement in IRF and SRF).   Left Eye Quality was good. Central Foveal Thickness: 439. Progression has improved. Findings include abnormal foveal contour, intraretinal fluid, subretinal fluid, epiretinal membrane, outer retinal atrophy (Interval improvement in IRF and SRF).   Notes *Images captured and stored on drive  Diagnosis / Impression:  Massive DME OU OD: interval improvement in IRF and SRF OS: interval improvement in IRF and SRF   Clinical management:  See below  Abbreviations: NFP - Normal foveal profile. CME - cystoid macular edema. PED - pigment epithelial detachment. IRF - intraretinal fluid. SRF - subretinal fluid. EZ - ellipsoid zone. ERM - epiretinal membrane. ORA - outer retinal atrophy. ORT - outer retinal tubulation. SRHM - subretinal hyper-reflective material        Intravitreal Injection, Pharmacologic Agent - OD -  Right Eye       Time Out 10/09/2017. 10:32 AM. Confirmed correct patient, procedure, site, and patient consented.   Anesthesia Topical anesthesia was used. Anesthetic medications included Lidocaine 2%, Tetracaine 0.5%.   Procedure Preparation included 5% betadine to ocular surface, eyelid speculum. A supplied needle was used.   Injection: 1.25 mg Bevacizumab 1.25mg /0.61ml   NDC: 16109-604-54    Lot: [id number]     Expiration Date: 01/04/2018   Route: Intravitreal   Site: Right Eye   Waste: 0 mg  Post-op Post injection  exam found visual acuity of at least counting fingers. The patient tolerated the procedure well. There were no complications. The patient received written and verbal post procedure care education.        Intravitreal Injection, Pharmacologic Agent - OS - Left Eye       Time Out 10/09/2017. 10:32 AM. Confirmed correct patient, procedure, site, and patient consented.   Anesthesia Topical anesthesia was used. Anesthetic medications included Tetracaine 0.5%, Lidocaine 2%.   Procedure Preparation included 5% betadine to ocular surface, eyelid speculum. A 30 gauge needle was used.   Injection: 1.25 mg Bevacizumab 1.25mg /0.68ml   NDC: 81191-478-29    Lot: [id number]     Expiration Date: 11/16/2017   Route: Intravitreal   Site: Left Eye   Waste: 0 mg  Post-op Post injection exam found visual acuity of at least counting fingers. The patient tolerated the procedure well. There were no complications. The patient received written and verbal post procedure care education.                 ASSESSMENT/PLAN:    ICD-10-CM   1. Severe nonproliferative diabetic retinopathy of both eyes with macular edema associated with type 2 diabetes mellitus (HCC) E11.3413 OCT, Retina - OU - Both Eyes    Intravitreal Injection, Pharmacologic Agent - OD - Right Eye    Intravitreal Injection, Pharmacologic Agent - OS - Left Eye    Bevacizumab (AVASTIN) SOLN 1.25 mg     Bevacizumab (AVASTIN) SOLN 1.25 mg  2. Retinal edema H35.81 OCT, Retina - OU - Both Eyes  3. Essential hypertension I10   4. Hypertensive retinopathy of both eyes H35.033   5. 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 Severe Non-proliferative diabetic retinopathy, both eyes - initial exam showed massive central DME OU and scattered IRH and IRMA - FA 6.5.19 with patches of capillary nonperfusion; extensive Mas with late leakage OU; no frank NV - S/P IVA OS #1 (06.05.19) - S/P IVA OD #1 (06.07.19) - OCT shows diabetic macular edema, both eyes (OS > OD) -- interval improvement OU - recommend IVA OU #2 today (07.08.19) - pt wishes to proceed - RBA of procedure discussed, questions answered - informed consent obtained and signed - see procedure note -- tolerated well - f/u in 4 weeks  3,4. Hypertensive retinopathy OU - discussed importance of tight BP control - monitor  5. 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 - discussed diagnosis and progression - not yet visually significant - monitor for now   Ophthalmic Meds Ordered this visit:  Meds ordered this encounter  Medications  . Bevacizumab (AVASTIN) SOLN 1.25 mg  . Bevacizumab (AVASTIN) SOLN 1.25 mg       Return in about 1 month (around 11/06/2017) for F/U NPDR OU, DFE, OCT.  There are no Patient Instructions on file for this visit.   Explained the diagnoses, plan, and follow up with the patient and they expressed understanding.  Patient expressed understanding of the importance of proper follow up care.   This document serves as a record of services personally performed by 21/08/2017, MD, PhD. It was created on their behalf by Gardiner Sleeper, [place], a certified ophthalmic assistant. The creation of this record is the provider's dictation and/or activities during the visit.  Electronically  signed by: [address], [place]  07.03.19 11:47 AM   20.03.19, M.D.,  Ph.D. Diseases & Surgery of the Retina and Vitreous [facility]  I have reviewed the above documentation for accuracy and completeness, and I agree with the above. Gardiner Sleeper, M.D., Ph.D. 10/09/17 11:49 AM     Abbreviations: M myopia (nearsighted); A astigmatism; H hyperopia (farsighted); P presbyopia; Mrx spectacle prescription;  CTL contact lenses; OD right eye; OS left eye; OU both eyes  XT exotropia; ET esotropia; PEK punctate epithelial keratitis; PEE punctate epithelial erosions; DES dry eye syndrome; MGD meibomian gland dysfunction; ATs artificial tears; PFAT's preservative free artificial tears; [place] nuclear sclerotic cataract; PSC posterior subcapsular cataract; ERM epi-retinal membrane; PVD posterior vitreous detachment; RD retinal detachment; DM diabetes mellitus; DR diabetic retinopathy; NPDR non-proliferative diabetic retinopathy; PDR proliferative diabetic retinopathy; CSME clinically significant macular edema; DME diabetic macular edema; dbh dot blot hemorrhages; CWS cotton wool spot; POAG primary open angle glaucoma; C/D cup-to-disc ratio; HVF humphrey visual field; GVF goldmann visual field; OCT optical coherence tomography; IOP intraocular pressure; BRVO Branch retinal vein occlusion; CRVO central retinal vein occlusion; CRAO central retinal artery occlusion; BRAO branch retinal artery occlusion; RT retinal tear; SB scleral buckle; PPV pars plana vitrectomy; VH Vitreous hemorrhage; PRP panretinal laser photocoagulation; IVK intravitreal kenalog; VMT vitreomacular traction; MH Macular hole;  NVD neovascularization of the disc; NVE neovascularization elsewhere; AREDS age related eye disease study; ARMD age related macular degeneration; POAG primary open angle glaucoma; EBMD epithelial/anterior basement membrane dystrophy; ACIOL anterior chamber intraocular lens; IOL intraocular lens;  PCIOL posterior chamber intraocular lens; Phaco/IOL phacoemulsification with intraocular lens placement; [place] photorefractive keratectomy; LASIK laser assisted in situ keratomileusis; HTN hypertension; DM diabetes mellitus; COPD chronic obstructive pulmonary disease

## 2017-10-09 ENCOUNTER — Encounter (INDEPENDENT_AMBULATORY_CARE_PROVIDER_SITE_OTHER): Payer: Self-pay | Admitting: Ophthalmology

## 2017-10-09 ENCOUNTER — Ambulatory Visit (INDEPENDENT_AMBULATORY_CARE_PROVIDER_SITE_OTHER): Payer: BC Managed Care – PPO | Admitting: Ophthalmology

## 2017-10-09 DIAGNOSIS — H35033 Hypertensive retinopathy, bilateral: Secondary | ICD-10-CM

## 2017-10-09 DIAGNOSIS — I10 Essential (primary) hypertension: Secondary | ICD-10-CM

## 2017-10-09 DIAGNOSIS — E113413 Type 2 diabetes mellitus with severe nonproliferative diabetic retinopathy with macular edema, bilateral: Secondary | ICD-10-CM | POA: Diagnosis not present

## 2017-10-09 DIAGNOSIS — H3581 Retinal edema: Secondary | ICD-10-CM | POA: Diagnosis not present

## 2017-10-09 DIAGNOSIS — H25813 Combined forms of age-related cataract, bilateral: Secondary | ICD-10-CM

## 2017-10-09 MED ORDER — BEVACIZUMAB CHEMO INJECTION 1.25MG/0.05ML SYRINGE FOR KALEIDOSCOPE
1.2500 mg | INTRAVITREAL | Status: DC
  Administered 2017-10-09: 1.25 mg via INTRAVITREAL

## 2017-11-02 NOTE — Progress Notes (Signed)
[facility] Note  11/06/2017     CHIEF COMPLAINT Patient presents for Retina Follow Up   HISTORY OF PRESENT ILLNESS: Philip Wilson is a 47 y.o. male who presents to the clinic today for:   HPI    Retina Follow Up    Patient presents with  Diabetic Retinopathy.  In both eyes.  Severity is moderate.  Duration of 4 weeks.  Since onset it is stable.  I, the attending physician,  performed the HPI with the patient and updated documentation appropriately.          Comments    Pt presents for NPDR OU f/u, pt states his vision has been okay since last visit, pt denies flashes, floaters, pain or wavy vision, pt states his blood sugar has been good recently, pt states he is tolerating injections well       Last edited by Bernarda Caffey, MD on 11/06/2017 10:02 AM. (History)    Pt reports OU VA is continuing to improve;   Referring physician: Lillard Anes, MD [address] [place], [place] [address]  HISTORICAL INFORMATION:   Selected notes from the MEDICAL RECORD NUMBER Referred by Dr. Ricki Mcmullan for diabetic retinopathy OU LEE: 05.28.19 (A. Hager) [BCVA: OD: 20/40 OS: 20/50+2] Ocular Hx-Diabetic Retinopathy PMH-DM    CURRENT MEDICATIONS: No current outpatient medications on file. (Ophthalmic Drugs)   No current facility-administered medications for this visit.  (Ophthalmic Drugs)   Current Outpatient Medications (Other)  Medication Sig  . atorvastatin (LIPITOR) 40 MG tablet   . glimepiride (AMARYL) 4 MG tablet Take by mouth.  Marland Kitchen lisinopril-hydrochlorothiazide (PRINZIDE,ZESTORETIC) 20-12.5 MG tablet   . metFORMIN (GLUCOPHAGE) 1000 MG tablet TK 1 T PO BID   Current Facility-Administered Medications (Other)  Medication Route  . Bevacizumab (AVASTIN) SOLN 1.25 mg Intravitreal  . Bevacizumab (AVASTIN) SOLN 1.25 mg Intravitreal  . Bevacizumab (AVASTIN) SOLN 1.25 mg Intravitreal  . Bevacizumab (AVASTIN) SOLN 1.25 mg Intravitreal   . Bevacizumab (AVASTIN) SOLN 1.25 mg Intravitreal  . Bevacizumab (AVASTIN) SOLN 1.25 mg Intravitreal      REVIEW OF SYSTEMS: ROS    Positive for: Endocrine, Cardiovascular, Eyes, Allergic/Imm   Negative for: Constitutional, Gastrointestinal, Neurological, Skin, Genitourinary, Musculoskeletal, HENT, Respiratory, Psychiatric, Heme/Lymph   Last edited by Debbrah Alar, COT on 11/06/2017  9:39 AM. (History)       ALLERGIES No Known Allergies  PAST MEDICAL HISTORY Past Medical History:  Diagnosis Date  . Diabetes 1.5, managed as type 2 ([place])   . Hypertension    History reviewed. No pertinent surgical history.  FAMILY HISTORY Family History  Problem Relation Age of Onset  . Heart failure Mother     SOCIAL HISTORY Social History   Tobacco Use  . Smoking status: Former Research scientist (life sciences)  . Smokeless tobacco: Never Used  Substance Use Topics  . Alcohol use: Yes    Alcohol/week: 0.6 oz    Types: 1 Cans of beer per week  . Drug use: Never         OPHTHALMIC EXAM:  Base Eye Exam    Visual Acuity (Snellen - Linear)      Right Left   Dist [place] 20/30 -2 20/40 +2   Dist ph [place] NI 20/30 +1       Tonometry (Tonopen, 9:44 AM)      Right Left   Pressure 20 23       Pupils      Dark Light Shape React  APD   Right 4 2 Round Brisk None   Left 4 2 Round Brisk None       Visual Fields (Counting fingers)      Left Right    Full Full       Extraocular Movement      Right Left    Full, Ortho Full, Ortho       Neuro/Psych    Oriented x3:  Yes   Mood/Affect:  Normal       Dilation    Both eyes:  1.0% Mydriacyl, 2.5% Phenylephrine @ 9:44 AM        Slit Lamp and Fundus Exam    Slit Lamp Exam      Right Left   Lids/Lashes Dermatochalasis - upper lid, mild Meibomian gland dysfunction Dermatochalasis - upper lid, mild Meibomian gland dysfunction   Conjunctiva/Sclera White and quiet White and quiet   Cornea Clear Clear   Anterior Chamber Deep and quiet, no cell or flare  Deep and quiet, no cell or flare   Iris Round and dilated to 23mm Round and dilated to 84mm   Lens 1+ Nuclear sclerosis, 2+ Cortical cataract 1+ Nuclear sclerosis, 2+ Cortical cataract   Vitreous Mild Vitreous syneresis Mild Vitreous syneresis, old white VH inferiorly,       Fundus Exam      Right Left   Disc Pink and Sharp Pink and Sharp, hyperemia   C/D Ratio 0.2 0.2   Macula Blunted foveal reflex, central edema -- improved, DBH Microaneurysms, Exudates, scattered Intraretinal hemorrhage Blunted foveal reflex, central edema with striae -- improved edema, scattered Microaneurysms, scattered Intraretinal hemorrhage -- improved, scattered Exudates superior macula   Vessels Tortuous, AV crossing changes, ? NV inferior to arcades Tortuous, AV crossing changes   Periphery Attached, scattered MAs and DBH - mostly posterior, scattered CWS Attached, scattered MAs and DBH -- mostly posterior          IMAGING AND PROCEDURES  Imaging and Procedures for @TODAY @  OCT, Retina - OU - Both Eyes       Right Eye Quality was good. Central Foveal Thickness: 450. Progression has worsened. Findings include abnormal foveal contour, intraretinal fluid, outer retinal atrophy, no SRF (Interval worsening of IRF).   Left Eye Quality was good. Central Foveal Thickness: 395. Progression has improved. Findings include abnormal foveal contour, intraretinal fluid, epiretinal membrane, outer retinal atrophy, no SRF (Improved foveal contour, improved SRF, interval worsening in IRF).   Notes *Images captured and stored on drive  Diagnosis / Impression:  Massive DME OU improving OD: interval worsening of IRF OS: interval improvement in SRF and central foveal contour, interval worsening in IRF  Clinical management:  See below  Abbreviations: NFP - Normal foveal profile. CME - cystoid macular edema. PED - pigment epithelial detachment. IRF - intraretinal fluid. SRF - subretinal fluid. EZ - ellipsoid zone. ERM -  epiretinal membrane. ORA - outer retinal atrophy. ORT - outer retinal tubulation. SRHM - subretinal hyper-reflective material        Intravitreal Injection, Pharmacologic Agent - OD - Right Eye       Time Out 11/06/2017. 10:17 AM. Confirmed correct patient, procedure, site, and patient consented.   Anesthesia Topical anesthesia was used. Anesthetic medications included Lidocaine 2%, Tetracaine 0.5%.   Procedure Preparation included 5% betadine to ocular surface, eyelid speculum. A 30 gauge needle was used.   Injection: 1.25 mg Bevacizumab 1.25mg /0.60ml   NDC: 62836-629-47    Lot: [id number]     Expiration Date: 01/04/2018  Route: Intravitreal   Site: Right Eye   Waste: 0 mg  Post-op Post injection exam found visual acuity of at least counting fingers. The patient tolerated the procedure well. There were no complications. The patient received written and verbal post procedure care education.        Intravitreal Injection, Pharmacologic Agent - OS - Left Eye       Time Out 11/06/2017. 10:17 AM. Confirmed correct patient, procedure, site, and patient consented.   Anesthesia Topical anesthesia was used. Anesthetic medications included Tetracaine 0.5%, Lidocaine 2%.   Procedure Preparation included 5% betadine to ocular surface, eyelid speculum. A supplied needle was used.   Injection: 1.25 mg Bevacizumab 1.25mg /0.41ml   NDC: 66063-016-01    Lot: [id number]     Expiration Date: 12/06/2017   Route: Intravitreal   Site: Left Eye   Waste: 0 mg  Post-op Post injection exam found visual acuity of at least counting fingers. The patient tolerated the procedure well. There were no complications. The patient received written and verbal post procedure care education.                 ASSESSMENT/PLAN:    ICD-10-CM   1. 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 Retinal edema H35.81 OCT, Retina - OU - Both Eyes  3. Essential hypertension I10   4. Hypertensive retinopathy of both eyes H35.033   5. 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 Severe Non-proliferative diabetic retinopathy, both eyes - initial exam showed massive central DME OU and scattered IRH and IRMA - FA 6.5.19 with patches of capillary nonperfusion; extensive Mas with late leakage OU; no frank NV - S/P IVA OS #1 (06.05.19), #2 (07.08.19) - S/P IVA OD #1 (06.07.19), #2 (07.08.19) - OCT shows diabetic macular edema, both eyes  Today, OD with slightly worse IRF -- ?refractory   OS with improved foveal contour and IRF - interestingly, BCVA continues to improve to 20/30 OU - recommend IVA OU #3 today (08.05.19), but discussed possibility of switching therapy should DME or vision worsen - pt wishes to proceed - RBA of procedure discussed, questions answered - informed consent obtained and signed - see procedure note -- tolerated well - [place] paperwork and benefits investigation started on 08.05.19 - f/u in 4 weeks, DFE, repeat OCT, FA (Optos, transit OD)  3,4. Hypertensive retinopathy OU - discussed importance of tight BP control - monitor  5. 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 - discussed diagnosis and progression - not yet visually significant - monitor for now   Ophthalmic Meds Ordered this visit:  Meds ordered this encounter  Medications  . Bevacizumab (AVASTIN) SOLN 1.25 mg  . Bevacizumab (AVASTIN) SOLN 1.25 mg       Return in about 1 month (around 12/04/2017) for F/U NPDR OU, DFE, OCT, FA, Possible Injxn.  There are no  Patient Instructions on file for this visit.   Explained the diagnoses, plan, and follow up with the patient and they expressed understanding.  Patient expressed understanding of the  importance of proper follow up care.   This document serves as a record of services personally performed by Gardiner Sleeper, MD, PhD. It was created on their behalf by Catha Brow, [place], a certified ophthalmic assistant. The creation of this record is the provider's dictation and/or activities during the visit.  Electronically signed by: Catha Brow, COA  08.01.19 11:59 AM   Gardiner Sleeper, M.D., Ph.D. Diseases & Surgery of the Retina and Vitreous [facility]   I have reviewed the above documentation for accuracy and completeness, and I agree with the above. Gardiner Sleeper, M.D., Ph.D. 11/06/17 11:59 AM   Abbreviations: M myopia (nearsighted); A astigmatism; H hyperopia (farsighted); P presbyopia; Mrx spectacle prescription;  CTL contact lenses; OD right eye; OS left eye; OU both eyes  XT exotropia; ET esotropia; PEK punctate epithelial keratitis; PEE punctate epithelial erosions; DES dry eye syndrome; MGD meibomian gland dysfunction; ATs artificial tears; PFAT's preservative free artificial tears; [place] nuclear sclerotic cataract; PSC posterior subcapsular cataract; ERM epi-retinal membrane; PVD posterior vitreous detachment; RD retinal detachment; DM diabetes mellitus; DR diabetic retinopathy; NPDR non-proliferative diabetic retinopathy; PDR proliferative diabetic retinopathy; CSME clinically significant macular edema; DME diabetic macular edema; dbh dot blot hemorrhages; CWS cotton wool spot; POAG primary open angle glaucoma; C/D cup-to-disc ratio; HVF humphrey visual field; GVF goldmann visual field; OCT optical coherence tomography; IOP intraocular pressure; BRVO Branch retinal vein occlusion; CRVO central retinal vein occlusion; CRAO central retinal artery occlusion; BRAO branch  retinal artery occlusion; RT retinal tear; SB scleral buckle; PPV pars plana vitrectomy; VH Vitreous hemorrhage; PRP panretinal laser photocoagulation; IVK intravitreal kenalog; VMT vitreomacular traction; MH Macular hole;  NVD neovascularization of the disc; NVE neovascularization elsewhere; AREDS age related eye disease study; ARMD age related macular degeneration; POAG primary open angle glaucoma; EBMD epithelial/anterior basement membrane dystrophy; ACIOL anterior chamber intraocular lens; IOL intraocular lens; PCIOL posterior chamber intraocular lens; Phaco/IOL phacoemulsification with intraocular lens placement; [place] photorefractive keratectomy; LASIK laser assisted in situ keratomileusis; HTN hypertension; DM diabetes mellitus; COPD chronic obstructive pulmonary disease

## 2017-11-06 ENCOUNTER — Ambulatory Visit (INDEPENDENT_AMBULATORY_CARE_PROVIDER_SITE_OTHER): Payer: BC Managed Care – PPO | Admitting: Ophthalmology

## 2017-11-06 ENCOUNTER — Encounter (INDEPENDENT_AMBULATORY_CARE_PROVIDER_SITE_OTHER): Payer: BC Managed Care – PPO | Admitting: Ophthalmology

## 2017-11-06 ENCOUNTER — Encounter (INDEPENDENT_AMBULATORY_CARE_PROVIDER_SITE_OTHER): Payer: Self-pay | Admitting: Ophthalmology

## 2017-11-06 DIAGNOSIS — H3581 Retinal edema: Secondary | ICD-10-CM | POA: Diagnosis not present

## 2017-11-06 DIAGNOSIS — I10 Essential (primary) hypertension: Secondary | ICD-10-CM

## 2017-11-06 DIAGNOSIS — H25813 Combined forms of age-related cataract, bilateral: Secondary | ICD-10-CM

## 2017-11-06 DIAGNOSIS — H35033 Hypertensive retinopathy, bilateral: Secondary | ICD-10-CM

## 2017-11-06 DIAGNOSIS — E113413 Type 2 diabetes mellitus with severe nonproliferative diabetic retinopathy with macular edema, bilateral: Secondary | ICD-10-CM

## 2017-11-06 MED ORDER — BEVACIZUMAB CHEMO INJECTION 1.25MG/0.05ML SYRINGE FOR KALEIDOSCOPE
1.2500 mg | INTRAVITREAL | Status: DC
Start: 2017-11-06 — End: 2022-05-26
  Administered 2017-11-06: 1.25 mg via INTRAVITREAL

## 2017-11-06 MED ORDER — BEVACIZUMAB CHEMO INJECTION 1.25MG/0.05ML SYRINGE FOR KALEIDOSCOPE
1.2500 mg | INTRAVITREAL | Status: DC
  Administered 2017-11-06: 1.25 mg via INTRAVITREAL

## 2017-12-01 NOTE — Progress Notes (Addendum)
[facility] Note  12/05/2017     CHIEF COMPLAINT Patient presents for Retina Follow Up   HISTORY OF PRESENT ILLNESS: Philip Wilson is a 47 y.o. male who presents to the clinic today for:   HPI    Retina Follow Up    Patient presents with  Diabetic Retinopathy.  In both eyes.  This started 5 months ago.  Severity is severe.  Duration of 1 month.  Since onset it is stable.  I, the attending physician,  performed the HPI with the patient and updated documentation appropriately.          Comments    Patient states CBG this am was 110. Last A1c was 6.0 2 weeks ago. Unsure if any vision changes. Only minor discomfort after eye injections.        Last edited by Bernarda Caffey, MD on 12/05/2017 10:24 AM. (History)    Pt reports OU VA is continuing to improve;   Referring physician: Lillard Anes, MD [address] [place], [place] [address]  HISTORICAL INFORMATION:   Selected notes from the MEDICAL RECORD NUMBER Referred by Dr. Ricki Dripps for diabetic retinopathy OU LEE: 05.28.19 (A. Hager) [BCVA: OD: 20/40 OS: 20/50+2] Ocular Hx-Diabetic Retinopathy PMH-DM    CURRENT MEDICATIONS: No current outpatient medications on file. (Ophthalmic Drugs)   Current Facility-Administered Medications (Ophthalmic Drugs)  Medication Route  . aflibercept (EYLEA) SOLN 2 mg Intravitreal   Current Outpatient Medications (Other)  Medication Sig  . ACCU-CHEK GUIDE test strip every morning. Use to test fasting blood glucose  . atorvastatin (LIPITOR) 40 MG tablet   . glimepiride (AMARYL) 4 MG tablet Take by mouth.  Marland Kitchen lisinopril-hydrochlorothiazide (PRINZIDE,ZESTORETIC) 20-12.5 MG tablet   . metFORMIN (GLUCOPHAGE) 1000 MG tablet TK 1 T PO BID   Current Facility-Administered Medications (Other)  Medication Route  . Bevacizumab (AVASTIN) SOLN 1.25 mg Intravitreal  . Bevacizumab (AVASTIN) SOLN 1.25 mg Intravitreal  . Bevacizumab (AVASTIN) SOLN 1.25  mg Intravitreal  . Bevacizumab (AVASTIN) SOLN 1.25 mg Intravitreal  . Bevacizumab (AVASTIN) SOLN 1.25 mg Intravitreal  . Bevacizumab (AVASTIN) SOLN 1.25 mg Intravitreal  . Bevacizumab (AVASTIN) SOLN 1.25 mg Intravitreal      REVIEW OF SYSTEMS: ROS    Positive for: Endocrine, Cardiovascular, Eyes, Allergic/Imm   Negative for: Constitutional, Gastrointestinal, Neurological, Skin, Genitourinary, Musculoskeletal, HENT, Respiratory, Psychiatric, Heme/Lymph   Last edited by Roselee Nova D on 12/05/2017  9:48 AM. (History)       ALLERGIES No Known Allergies  PAST MEDICAL HISTORY Past Medical History:  Diagnosis Date  . Diabetes 1.5, managed as type 2 ([place])   . Hypertension    History reviewed. No pertinent surgical history.  FAMILY HISTORY Family History  Problem Relation Age of Onset  . Heart failure Mother     SOCIAL HISTORY Social History   Tobacco Use  . Smoking status: Former Research scientist (life sciences)  . Smokeless tobacco: Never Used  Substance Use Topics  . Alcohol use: Yes    Alcohol/week: 1.0 standard drinks    Types: 1 Cans of beer per week  . Drug use: Never         OPHTHALMIC EXAM:  Base Eye Exam    Visual Acuity (Snellen - Linear)      Right Left   Dist [place] 20/40 +2 20/30 +1   Dist ph [place] 20/30 -2 NI       Tonometry (Tonopen, 9:59 AM)      Right Left  Pressure 19 18       Pupils      Dark Light Shape React APD   Right 4 2 Round Brisk None   Left 4 2 Round Brisk None       Visual Fields (Counting fingers)      Left Right    Full Full       Extraocular Movement      Right Left    Full, Ortho Full, Ortho       Neuro/Psych    Oriented x3:  Yes   Mood/Affect:  Normal       Dilation    Both eyes:  1.0% Mydriacyl, 2.5% Phenylephrine @ 9:59 AM        Slit Lamp and Fundus Exam    Slit Lamp Exam      Right Left   Lids/Lashes Dermatochalasis - upper lid, mild Meibomian gland dysfunction Dermatochalasis - upper lid, mild Meibomian gland dysfunction    Conjunctiva/Sclera White and quiet White and quiet   Cornea Clear Clear   Anterior Chamber Deep and quiet, no cell or flare Deep and quiet, no cell or flare   Iris Round and dilated to 25mm Round and dilated to 88mm   Lens 1+ Nuclear sclerosis, 2+ Cortical cataract 1+ Nuclear sclerosis, 2+ Cortical cataract   Vitreous Mild Vitreous syneresis Mild Vitreous syneresis, old white VH inferiorly,       Fundus Exam      Right Left   Disc Pink and Sharp Pink and Sharp, hyperemia   C/D Ratio 0.2 0.2   Macula Blunted foveal reflex, central edema -- persistant, DBH Microaneurysms, Exudates, scattered Intraretinal hemorrhage --improved Blunted foveal reflex, central edema with striae -- persistant edema, scattered Microaneurysms, scattered Intraretinal hemorrhage -- improved, scattered Exudates superior macula   Vessels Tortuous, AV crossing changes, ? NV inferior to arcades Tortuous, AV crossing changes   Periphery Attached, scattered MAs and DBH - mostly posterior, scattered CWS -- all improved Attached, scattered MAs and DBH -- mostly posterior -- all improved          IMAGING AND PROCEDURES  Imaging and Procedures for @TODAY @  OCT, Retina - OU - Both Eyes       Right Eye Quality was good. Central Foveal Thickness: 470. Progression has worsened. Findings include abnormal foveal contour, intraretinal fluid, outer retinal atrophy, no SRF (Mild interval increase in IRF).   Left Eye Quality was good. Central Foveal Thickness: 461. Progression has worsened. Findings include abnormal foveal contour, intraretinal fluid, epiretinal membrane, outer retinal atrophy, no SRF (interval worsening in IRF centrally).   Notes *Images captured and stored on drive  Diagnosis / Impression:  Massive DME OU improving OD: interval worsening of IRF OS: interval improvement in SRF and central foveal contour, interval worsening in IRF  Clinical management:  See below  Abbreviations: NFP - Normal foveal  profile. CME - cystoid macular edema. PED - pigment epithelial detachment. IRF - intraretinal fluid. SRF - subretinal fluid. EZ - ellipsoid zone. ERM - epiretinal membrane. ORA - outer retinal atrophy. ORT - outer retinal tubulation. SRHM - subretinal hyper-reflective material        Intravitreal Injection, Pharmacologic Agent - OS - Left Eye       Time Out 12/05/2017. 11:09 AM. Confirmed correct patient, procedure, site, and patient consented.   Anesthesia Topical anesthesia was used. Anesthetic medications included Lidocaine 2%, Tetracaine 0.5%.   Procedure Preparation included eyelid speculum, 5% betadine to ocular surface. A 30 gauge needle was  used.   Injection:  1.25 mg Bevacizumab 1.25mg /0.12ml   NDC: 50242-060-01, Lot: [id number] , Expiration date: 01/04/2018   Route: Intravitreal, Site: Left Eye, Waste: 0 mg  Post-op Post injection exam found visual acuity of at least counting fingers. The patient tolerated the procedure well. There were no complications. The patient received written and verbal post procedure care education.        Fluorescein Angiography Optos (Transit OD)       Right Eye   Progression has improved. Early phase findings include microaneurysm, vascular perfusion defect, leakage. Mid/Late phase findings include leakage, microaneurysm, vascular perfusion defect.   Left Eye   Progression has improved. Early phase findings include leakage, microaneurysm, vascular perfusion defect. Mid/Late phase findings include leakage, microaneurysm, vascular perfusion defect.   Notes Images captured and stored on drive;   Impression: No NV OU Severe NPDR with interval improvement in hyperfluorescent leakage OU         Intravitreal Injection, Pharmacologic Agent - OD - Right Eye       Time Out 12/05/2017. 11:17 AM. Confirmed correct patient, procedure, site, and patient consented.   Anesthesia Anesthetic medications included Tetracaine 0.5%, Lidocaine  2%.   Procedure Preparation included 5% betadine to ocular surface, eyelid speculum. A 30 gauge needle was used.   Injection:  2 mg aflibercept 2 MG/0.05ML   NDC: 61755-005-02, Lot: [id number], Expiration date: 11/01/2018   Route: Intravitreal, Site: Right Eye, Waste: 0.05 mL  Post-op Post injection exam found visual acuity of at least counting fingers. The patient tolerated the procedure well. There were no complications. The patient received written and verbal post procedure care education.                 ASSESSMENT/PLAN:    ICD-10-CM   1. 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 Retinal edema H35.81 OCT, Retina - OU - Both Eyes    Fluorescein Angiography Optos (Transit OD)  3. Essential hypertension I10   4. Hypertensive retinopathy of both eyes H35.033   5. 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 Severe Non-proliferative diabetic retinopathy, both eyes - initial exam showed massive central DME OU and scattered IRH and IRMA - FA 6.5.19 with patches of capillary nonperfusion; extensive Mas with late leakage OU; no frank NV - S/P IVA OS #1 (06.05.19), #2 (07.08.19), #3 (07.08.19) - S/P IVA OD #1 (06.07.19), #2 (07.08.19), #3 (07.08.19) - OCT shows diabetic macular edema, both eyes  Today, OD with worsening in IRF -- ?refractory   OS with worsening in IRF - interestingly, BCVA remains stable at 20/30 OU - discussed possibility of switching therapy -- approved for Eylea - recommend IVE OD #1 today  (09.03.19) and IVA OS #4 (09.03.19) for head to head comparison - pt wishes to proceed - RBA of procedure discussed, questions answered - informed consent obtained and signed - see procedure note -- tolerated well - Eylea paperwork and benefits investigation started on 08.05.19 -- approved w/ Commercial assistance program -  f/u in 4 weeks, DFE, OCT  3,4. Hypertensive retinopathy OU - discussed importance of tight BP control - monitor  5. 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 - discussed diagnosis and progression - not yet visually significant - monitor for now   Ophthalmic Meds Ordered this visit:  Meds ordered this encounter  Medications  . Bevacizumab (AVASTIN) SOLN 1.25 mg  . aflibercept (EYLEA) SOLN 2 mg       Return in about 4 weeks (around 01/02/2018) for F/U NPDR OU, DFE, OCT.  There are no Patient Instructions on file for this visit.   Explained the diagnoses, plan, and follow up with the patient and they expressed understanding.  Patient expressed understanding of the importance of proper follow up care.   This document serves as a record of services personally performed by Gardiner Sleeper, MD, PhD. It was created on their behalf by Ernest Mallick, OA, an ophthalmic assistant. The creation of this record is the provider's dictation and/or activities during the visit.    Electronically signed by: Ernest Mallick, OA  08.30.2019 12:47 PM    Gardiner Sleeper, M.D., Ph.D. Diseases & Surgery of the Retina and Vitreous [facility]  I have reviewed the above documentation for accuracy and completeness, and I agree with the above. Gardiner Sleeper, M.D., Ph.D. 12/05/17 12:47 PM     Abbreviations: M myopia (nearsighted); A astigmatism; H hyperopia (farsighted); P presbyopia; Mrx spectacle prescription;  CTL contact lenses; OD right eye; OS left eye; OU  both eyes  XT exotropia; ET esotropia; PEK punctate epithelial keratitis; PEE punctate epithelial erosions; DES dry eye syndrome; MGD meibomian gland dysfunction; ATs artificial tears; PFAT's preservative free artificial tears; [place] nuclear sclerotic cataract; PSC posterior subcapsular cataract; ERM epi-retinal membrane; PVD posterior vitreous detachment; RD retinal detachment; DM diabetes mellitus; DR diabetic retinopathy; NPDR non-proliferative diabetic retinopathy; PDR proliferative diabetic retinopathy; CSME clinically significant macular edema; DME diabetic macular edema; dbh dot blot hemorrhages; CWS cotton wool spot; POAG primary open angle glaucoma; C/D cup-to-disc ratio; HVF humphrey visual field; GVF goldmann visual field; OCT optical coherence tomography; IOP intraocular pressure; BRVO Branch retinal vein occlusion; CRVO central retinal vein occlusion; CRAO central retinal artery occlusion; BRAO branch retinal artery occlusion; RT retinal tear; SB scleral buckle; PPV pars plana vitrectomy; VH Vitreous hemorrhage; PRP panretinal laser photocoagulation; IVK intravitreal kenalog; VMT vitreomacular traction; MH Macular hole;  NVD neovascularization of the disc; NVE neovascularization elsewhere; AREDS age related eye disease study; ARMD age related macular degeneration; POAG primary open angle glaucoma; EBMD epithelial/anterior basement membrane dystrophy; ACIOL anterior chamber intraocular lens; IOL intraocular lens; PCIOL posterior chamber intraocular lens; Phaco/IOL phacoemulsification with intraocular lens placement; [place] photorefractive keratectomy; LASIK laser assisted in situ keratomileusis; HTN hypertension; DM diabetes mellitus; COPD chronic obstructive pulmonary disease

## 2017-12-05 ENCOUNTER — Ambulatory Visit (INDEPENDENT_AMBULATORY_CARE_PROVIDER_SITE_OTHER): Payer: BC Managed Care – PPO | Admitting: Ophthalmology

## 2017-12-05 ENCOUNTER — Encounter (INDEPENDENT_AMBULATORY_CARE_PROVIDER_SITE_OTHER): Payer: Self-pay | Admitting: Ophthalmology

## 2017-12-05 DIAGNOSIS — E113413 Type 2 diabetes mellitus with severe nonproliferative diabetic retinopathy with macular edema, bilateral: Secondary | ICD-10-CM | POA: Diagnosis not present

## 2017-12-05 DIAGNOSIS — H3581 Retinal edema: Secondary | ICD-10-CM | POA: Diagnosis not present

## 2017-12-05 DIAGNOSIS — I10 Essential (primary) hypertension: Secondary | ICD-10-CM | POA: Diagnosis not present

## 2017-12-05 DIAGNOSIS — H25813 Combined forms of age-related cataract, bilateral: Secondary | ICD-10-CM

## 2017-12-05 DIAGNOSIS — H35033 Hypertensive retinopathy, bilateral: Secondary | ICD-10-CM

## 2017-12-05 MED ORDER — BEVACIZUMAB CHEMO INJECTION 1.25MG/0.05ML SYRINGE FOR KALEIDOSCOPE
1.2500 mg | INTRAVITREAL | Status: DC
  Administered 2017-12-05: 1.25 mg via INTRAVITREAL

## 2017-12-05 MED ORDER — AFLIBERCEPT 2MG/0.05ML IZ SOLN FOR KALEIDOSCOPE
2.0000 mg | INTRAVITREAL | Status: DC
  Administered 2017-12-05: 2 mg via INTRAVITREAL

## 2018-01-03 NOTE — Progress Notes (Signed)
[facility] Note  01/04/2018     CHIEF COMPLAINT Patient presents for Retina Follow Up   HISTORY OF PRESENT ILLNESS: Philip Wilson is a 47 y.o. male who presents to the clinic today for:   HPI    Retina Follow Up    Patient presents with  Diabetic Retinopathy.  In both eyes.  Severity is moderate.  Duration of 4 weeks.  Since onset it is stable.  I, the attending physician,  performed the HPI with the patient and updated documentation appropriately.          Comments    Pt presents for NPDR OU f/u, pt states VA seems about the same as last time, he denies flashes, floaters, and pain, pt denies the use of gtts, pts blood sugar was 74 this morning       Last edited by Bernarda Caffey, MD on 01/04/2018 10:04 AM. (History)    Pt reports OU VA is continuing to improve;   Referring physician: Lillard Anes, MD [address] [place], [place] [address]  HISTORICAL INFORMATION:   Selected notes from the MEDICAL RECORD NUMBER Referred by Dr. Ricki Spieler for diabetic retinopathy OU LEE: 05.28.19 (A. Hager) [[facility]: OD: 20/40 OS: 20/50+2] Ocular Hx-Diabetic Retinopathy PMH-DM    CURRENT MEDICATIONS: No current outpatient medications on file. (Ophthalmic Drugs)   Current Facility-Administered Medications (Ophthalmic Drugs)  Medication Route  . aflibercept (EYLEA) SOLN 2 mg Intravitreal  . aflibercept (EYLEA) SOLN 2 mg Intravitreal  . aflibercept (EYLEA) SOLN 2 mg Intravitreal   Current Outpatient Medications (Other)  Medication Sig  . ACCU-CHEK GUIDE test strip every morning. Use to test fasting blood glucose  . atorvastatin (LIPITOR) 40 MG tablet   . glimepiride (AMARYL) 4 MG tablet Take by mouth.  Marland Kitchen lisinopril-hydrochlorothiazide (PRINZIDE,ZESTORETIC) 20-12.5 MG tablet   . metFORMIN (GLUCOPHAGE) 1000 MG tablet TK 1 T PO BID   Current Facility-Administered Medications (Other)  Medication Route  . Bevacizumab (AVASTIN) SOLN  1.25 mg Intravitreal  . Bevacizumab (AVASTIN) SOLN 1.25 mg Intravitreal  . Bevacizumab (AVASTIN) SOLN 1.25 mg Intravitreal  . Bevacizumab (AVASTIN) SOLN 1.25 mg Intravitreal  . Bevacizumab (AVASTIN) SOLN 1.25 mg Intravitreal  . Bevacizumab (AVASTIN) SOLN 1.25 mg Intravitreal  . Bevacizumab (AVASTIN) SOLN 1.25 mg Intravitreal      REVIEW OF SYSTEMS: ROS    Positive for: Endocrine, Cardiovascular, Eyes   Negative for: Constitutional, Gastrointestinal, Neurological, Skin, Genitourinary, Musculoskeletal, HENT, Respiratory, Psychiatric, Allergic/Imm, Heme/Lymph   Last edited by Debbrah Alar, COT on 01/04/2018  9:43 AM. (History)       ALLERGIES No Known Allergies  PAST MEDICAL HISTORY Past Medical History:  Diagnosis Date  . Diabetes 1.5, managed as type 2 ([place])   . Hypertension    History reviewed. No pertinent surgical history.  FAMILY HISTORY Family History  Problem Relation Age of Onset  . Heart failure Mother     SOCIAL HISTORY Social History   Tobacco Use  . Smoking status: Former Research scientist (life sciences)  . Smokeless tobacco: Never Used  Substance Use Topics  . Alcohol use: Yes    Alcohol/week: 1.0 standard drinks    Types: 1 Cans of beer per week  . Drug use: Never         OPHTHALMIC EXAM:  Base Eye Exam    Visual Acuity (Snellen - Linear)      Right Left   Dist [place] 20/25 +2 20/40 -1   Dist ph  NI  20/40 +1       Tonometry (Tonopen, 9:50 AM)      Right Left   Pressure 18 19       Pupils      Dark Light Shape React APD   Right 3 2 Round Brisk None   Left 3 2 Round Brisk None       Visual Fields (Counting fingers)      Left Right    Full Full       Extraocular Movement      Right Left    Full, Ortho Full, Ortho       Neuro/Psych    Oriented x3:  Yes   Mood/Affect:  Normal       Dilation    Both eyes:  1.0% Mydriacyl, 2.5% Phenylephrine @ 9:50 AM        Slit Lamp and Fundus Exam    Slit Lamp Exam      Right Left   Lids/Lashes  Dermatochalasis - upper lid, mild Meibomian gland dysfunction Dermatochalasis - upper lid, mild Meibomian gland dysfunction   Conjunctiva/Sclera White and quiet White and quiet   Cornea Clear Clear   Anterior Chamber Deep and quiet, no cell or flare Deep and quiet, no cell or flare   Iris Round and dilated to 38mm Round and dilated to 80mm   Lens 1+ Nuclear sclerosis, 2+ Cortical cataract 1+ Nuclear sclerosis, 2+ Cortical cataract   Vitreous Mild Vitreous syneresis Mild Vitreous syneresis, old white VH inferiorly,       Fundus Exam      Right Left   Disc Pink and Sharp Pink and Sharp, hyperemia   C/D Ratio 0.2 0.2   Macula Blunted foveal reflex, central edema -- improved, DBH Microaneurysms, Exudates, scattered Intraretinal hemorrhage --improved Blunted foveal reflex, central edema with striae -- persistant edema and improved striae, scattered Microaneurysms, scattered Intraretinal hemorrhage -- improved, scattered Exudates superior macula   Vessels Tortuous, AV crossing changes, ? NV inferior to arcades Tortuous, AV crossing changes   Periphery Attached, scattered MAs and DBH - mostly posterior, scattered CWS -- all improved Attached, scattered MAs and DBH -- mostly posterior -- all improved          IMAGING AND PROCEDURES  Imaging and Procedures for @TODAY @  OCT, Retina - OU - Both Eyes       Right Eye Quality was good. Central Foveal Thickness: 330. Progression has improved. Findings include abnormal foveal contour, intraretinal fluid, outer retinal atrophy, no SRF (Interval improvement in foveal contour and IRF).   Left Eye Quality was good. Central Foveal Thickness: 427. Progression has improved. Findings include abnormal foveal contour, intraretinal fluid, epiretinal membrane, no SRF.   Notes *Images captured and stored on drive  Diagnosis / Impression:  Massive DME OU improving OD: interval improvement in IRF and central foveal contour OS: interval improvement in IRF and  central foveal contour  Clinical management:  See below  Abbreviations: NFP - Normal foveal profile. CME - cystoid macular edema. PED - pigment epithelial detachment. IRF - intraretinal fluid. SRF - subretinal fluid. EZ - ellipsoid zone. ERM - epiretinal membrane. ORA - outer retinal atrophy. ORT - outer retinal tubulation. SRHM - subretinal hyper-reflective material        Intravitreal Injection, Pharmacologic Agent - OD - Right Eye       Time Out 01/04/2018. 10:32 AM. Confirmed correct patient, procedure, site, and patient consented.   Anesthesia Topical anesthesia was used. Anesthetic medications included Lidocaine 2%, Tetracaine  0.5%.   Procedure Preparation included 5% betadine to ocular surface, eyelid speculum. A 30 gauge needle was used.   Injection:  2 mg aflibercept 2 MG/0.05ML   NDC: 61755-005-02, Lot: [id number], Expiration date: 12/02/2018   Route: Intravitreal, Site: Right Eye, Waste: 0.05 mL  Post-op Post injection exam found visual acuity of at least counting fingers. The patient tolerated the procedure well. There were no complications. The patient received written and verbal post procedure care education.        Intravitreal Injection, Pharmacologic Agent - OS - Left Eye       Time Out 01/04/2018. 10:34 AM. Confirmed correct patient, procedure, site, and patient consented.   Anesthesia Topical anesthesia was used. Anesthetic medications included Lidocaine 2%, Proparacaine 0.5%.   Procedure Preparation included 5% betadine to ocular surface, eyelid speculum. A 30 gauge needle was used.   Injection:  2 mg aflibercept 2 MG/0.05ML   NDC: 61755-005-02, Lot: [id number], Expiration date: 12/02/2018   Route: Intravitreal, Site: Left Eye, Waste: 0.05 mL  Post-op Post injection exam found visual acuity of at least counting fingers. The patient tolerated the procedure well. There were no complications. The patient received written and verbal post procedure care  education.                 ASSESSMENT/PLAN:    ICD-10-CM   1. 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 Retinal edema H35.81 OCT, Retina - OU - Both Eyes  3. Essential hypertension I10   4. Hypertensive retinopathy of both eyes H35.033   5. 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 Severe Non-proliferative diabetic retinopathy, both eyes - initial exam showed massive central DME OU and scattered IRH and IRMA - FA 6.5.19 with patches of capillary nonperfusion; extensive Mas with late leakage OU; no frank NV - S/P IVA OS #1 (06.05.19), #2 (07.08.19), #3 (07.08.19), #4 (09.03.19) - S/P IVA OD #1 (06.07.19), #2 (07.08.19), #3 (07.08.19) - S/P IVE OD #1 (09.03.19) - OCT shows improvement in foveal contour and IRF OU (OD >> OS) - BCVA OD improved to 20/25+2, OS worse at 20/40 - discussed findings - recommend IVE OU today (10.03.19) -- OD #2, OS #1 - pt wishes to proceed - RBA of procedure discussed, questions answered - informed consent obtained and signed - see procedure note -- tolerated well - Eylea paperwork and benefits investigation started on 08.05.19 -- approved w/ Commercial assistance program - f/u in 4 weeks, DFE, OCT  3,4. Hypertensive retinopathy OU - discussed importance of tight BP control - monitor  5. 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 - discussed diagnosis and progression - not yet visually significant - monitor for now   Ophthalmic Meds Ordered this visit:  Meds ordered this encounter  Medications  .  aflibercept (EYLEA) SOLN 2 mg  . aflibercept (EYLEA) SOLN 2 mg       Return in about 4 weeks (around 02/01/2018) for F/U NPDR OU, DFE, OCT.  There are no Patient Instructions on file for this visit.  Explained the diagnoses, plan, and follow up with the patient and they expressed understanding.  Patient expressed understanding of the importance of proper follow up care.   This document serves as a record of services personally performed by Gardiner Sleeper, MD, PhD. It was created on their behalf by Ernest Mallick, OA, an ophthalmic assistant. The creation of this record is the provider's dictation and/or activities during the visit.    Electronically signed by: Ernest Mallick, OA  10.02.19 12:05 PM     Gardiner Sleeper, M.D., Ph.D. Diseases & Surgery of the Retina and Vitreous [facility]   I have reviewed the above documentation for accuracy and completeness, and I agree with the above. Gardiner Sleeper, M.D., Ph.D. 01/04/18 12:05 PM    Abbreviations: M myopia (nearsighted); A astigmatism; H hyperopia (farsighted); P presbyopia; Mrx spectacle prescription;  CTL contact lenses; OD right eye; OS left eye; OU both eyes  XT exotropia; ET esotropia; PEK punctate epithelial keratitis; PEE punctate epithelial erosions; DES dry eye syndrome; MGD meibomian gland dysfunction; ATs artificial tears; PFAT's preservative free artificial tears; [place] nuclear sclerotic cataract; PSC posterior subcapsular cataract; ERM epi-retinal membrane; PVD posterior vitreous detachment; RD retinal detachment; DM diabetes mellitus; DR diabetic retinopathy; NPDR non-proliferative diabetic retinopathy; PDR proliferative diabetic retinopathy; CSME clinically significant macular edema; DME diabetic macular edema; dbh dot blot hemorrhages; CWS cotton wool spot; POAG primary open angle glaucoma; C/D cup-to-disc ratio; HVF humphrey visual field; GVF goldmann visual field; OCT optical coherence tomography;  IOP intraocular pressure; BRVO Branch retinal vein occlusion; CRVO central retinal vein occlusion; CRAO central retinal artery occlusion; BRAO branch retinal artery occlusion; RT retinal tear; SB scleral buckle; PPV pars plana vitrectomy; VH Vitreous hemorrhage; PRP panretinal laser photocoagulation; IVK intravitreal kenalog; VMT vitreomacular traction; MH Macular hole;  NVD neovascularization of the disc; NVE neovascularization elsewhere; AREDS age related eye disease study; ARMD age related macular degeneration; POAG primary open angle glaucoma; EBMD epithelial/anterior basement membrane dystrophy; ACIOL anterior chamber intraocular lens; IOL intraocular lens; PCIOL posterior chamber intraocular lens; Phaco/IOL phacoemulsification with intraocular lens placement; [place] photorefractive keratectomy; LASIK laser assisted in situ keratomileusis; HTN hypertension; DM diabetes mellitus; COPD chronic obstructive pulmonary disease

## 2018-01-04 ENCOUNTER — Encounter (INDEPENDENT_AMBULATORY_CARE_PROVIDER_SITE_OTHER): Payer: Self-pay | Admitting: Ophthalmology

## 2018-01-04 ENCOUNTER — Ambulatory Visit (INDEPENDENT_AMBULATORY_CARE_PROVIDER_SITE_OTHER): Payer: BC Managed Care – PPO | Admitting: Ophthalmology

## 2018-01-04 DIAGNOSIS — I10 Essential (primary) hypertension: Secondary | ICD-10-CM

## 2018-01-04 DIAGNOSIS — E113413 Type 2 diabetes mellitus with severe nonproliferative diabetic retinopathy with macular edema, bilateral: Secondary | ICD-10-CM

## 2018-01-04 DIAGNOSIS — H3581 Retinal edema: Secondary | ICD-10-CM

## 2018-01-04 DIAGNOSIS — H35033 Hypertensive retinopathy, bilateral: Secondary | ICD-10-CM

## 2018-01-04 DIAGNOSIS — H25813 Combined forms of age-related cataract, bilateral: Secondary | ICD-10-CM

## 2018-01-04 MED ORDER — AFLIBERCEPT 2MG/0.05ML IZ SOLN FOR KALEIDOSCOPE
2.0000 mg | INTRAVITREAL | Status: DC
  Administered 2018-01-04: 2 mg via INTRAVITREAL

## 2018-02-01 ENCOUNTER — Encounter (INDEPENDENT_AMBULATORY_CARE_PROVIDER_SITE_OTHER): Payer: BC Managed Care – PPO | Admitting: Ophthalmology

## 2018-02-01 NOTE — Progress Notes (Signed)
[facility] Note  02/05/2018     CHIEF COMPLAINT Patient presents for Retina Follow Up   HISTORY OF PRESENT ILLNESS: Philip Wilson is a 47 y.o. male who presents to the clinic today for:   HPI    Retina Follow Up    Patient presents with  Diabetic Retinopathy.  In both eyes.  This started months ago.  Severity is severe.  Duration of months.  Since onset it is gradually improving.  I, the attending physician,  performed the HPI with the patient and updated documentation appropriately.          Comments    47 y/o male pt returning after about 4 wks for f/u for severe NPDR OU w/mac edema.  VA OU may be a little better since last visit.  Denies pain, flashes, floaters, but has noticed some intermittent irritation OS.  OS also tears sometimes as well.  Pt has used AT to treat, but symptoms do not seem to improve.  BS this a.m. Was 39, and last A1C 2 mos ago was 6.0.       Last edited by Bernarda Caffey, MD on 02/05/2018  9:28 AM. (History)    Pt reports incremental improvement in [place], pt states blood sugar control has been good, he states it was 60 this morning  Referring physician: Lillard Anes, MD [address] [place], [place] [address]  HISTORICAL INFORMATION:   Selected notes from the MEDICAL RECORD NUMBER Referred by Dr. Ricki Ruben for diabetic retinopathy OU LEE: 05.28.19 (A. Hager) [BCVA: OD: 20/40 OS: 20/50+2] Ocular Hx-Diabetic Retinopathy PMH-DM    CURRENT MEDICATIONS: No current outpatient medications on file. (Ophthalmic Drugs)   Current Facility-Administered Medications (Ophthalmic Drugs)  Medication Route  . aflibercept (EYLEA) SOLN 2 mg Intravitreal  . aflibercept (EYLEA) SOLN 2 mg Intravitreal  . aflibercept (EYLEA) SOLN 2 mg Intravitreal  . aflibercept (EYLEA) SOLN 2 mg Intravitreal  . aflibercept (EYLEA) SOLN 2 mg Intravitreal   Current Outpatient Medications (Other)  Medication Sig  . ACCU-CHEK GUIDE  test strip every morning. Use to test fasting blood glucose  . atorvastatin (LIPITOR) 40 MG tablet   . glimepiride (AMARYL) 4 MG tablet Take by mouth.  Marland Kitchen lisinopril-hydrochlorothiazide (PRINZIDE,ZESTORETIC) 20-12.5 MG tablet   . metFORMIN (GLUCOPHAGE) 1000 MG tablet TK 1 T PO BID   Current Facility-Administered Medications (Other)  Medication Route  . Bevacizumab (AVASTIN) SOLN 1.25 mg Intravitreal  . Bevacizumab (AVASTIN) SOLN 1.25 mg Intravitreal  . Bevacizumab (AVASTIN) SOLN 1.25 mg Intravitreal  . Bevacizumab (AVASTIN) SOLN 1.25 mg Intravitreal  . Bevacizumab (AVASTIN) SOLN 1.25 mg Intravitreal  . Bevacizumab (AVASTIN) SOLN 1.25 mg Intravitreal  . Bevacizumab (AVASTIN) SOLN 1.25 mg Intravitreal      REVIEW OF SYSTEMS: ROS    Positive for: Endocrine, Eyes   Negative for: Constitutional, Gastrointestinal, Neurological, Skin, Genitourinary, Musculoskeletal, HENT, Cardiovascular, Respiratory, Psychiatric, Allergic/Imm, Heme/Lymph   Last edited by Matthew Folks, COA on 02/05/2018  8:20 AM. (History)       ALLERGIES No Known Allergies  PAST MEDICAL HISTORY Past Medical History:  Diagnosis Date  . Diabetes 1.5, managed as type 2 ([place])   . Hypertension    History reviewed. No pertinent surgical history.  FAMILY HISTORY Family History  Problem Relation Age of Onset  . Heart failure Mother     SOCIAL HISTORY Social History   Tobacco Use  . Smoking status: Former Research scientist (life sciences)  . Smokeless tobacco: Never Used  Substance Use Topics  . Alcohol use: Yes    Alcohol/week: 1.0 standard drinks    Types: 1 Cans of beer per week  . Drug use: Never         OPHTHALMIC EXAM:  Base Eye Exam    Visual Acuity (Snellen - Linear)      Right Left   Dist [place] 20/25 20/30   Dist ph [place] 20/25 + 20/30 +2       Tonometry (Tonopen, 8:27 AM)      Right Left   Pressure 20 19       Pupils      Dark Light Shape React APD   Right 3 2 Round Brisk None   Left 3 2 Round Brisk None        Visual Fields (Counting fingers)      Left Right    Full Full       Extraocular Movement      Right Left    Full, Ortho Full, Ortho       Neuro/Psych    Oriented x3:  Yes   Mood/Affect:  Normal       Dilation    Both eyes:  1.0% Mydriacyl, 2.5% Phenylephrine @ 8:27 AM        Slit Lamp and Fundus Exam    Slit Lamp Exam      Right Left   Lids/Lashes Dermatochalasis - upper lid, mild Meibomian gland dysfunction Dermatochalasis - upper lid, mild Meibomian gland dysfunction   Conjunctiva/Sclera White and quiet White and quiet   Cornea Clear Clear   Anterior Chamber Deep and quiet, no cell or flare Deep and quiet, no cell or flare   Iris Round and dilated to 33m Round and dilated to 749m  Lens 1+ Nuclear sclerosis, 2+ Cortical cataract 1+ Nuclear sclerosis, 2+ Cortical cataract   Vitreous Mild Vitreous syneresis Mild Vitreous syneresis, old white VH inferiorly,       Fundus Exam      Right Left   Disc Pink and Sharp Pink and Sharp, hyperemia   C/D Ratio 0.2 0.2   Macula Blunted foveal reflex, central edema -- improved, still with significant exudates, DBH/Microaneurysms - improved Blunted foveal reflex, central edema - improved still with significant exudates, DBH/MA's improved   Vessels Tortuous, AV crossing changes, ? NV inferior to arcades Tortuous, AV crossing changes   Periphery Attached, scattered MAs and DBH - mostly posterior, scattered CWS -- all improved Attached, scattered MAs and DBH -- mostly posterior -- all improved          IMAGING AND PROCEDURES  Imaging and Procedures for _0 @  OCT, Retina - OU - Both Eyes       Right Eye Quality was good. Central Foveal Thickness: 323. Progression has improved. Findings include abnormal foveal contour, intraretinal fluid, outer retinal atrophy, no SRF, vitreomacular adhesion , retinal drusen , intraretinal hyper-reflective material (Interval improvement in IRF).   Left Eye Quality was good. Central Foveal  Thickness: 289. Progression has improved. Findings include intraretinal fluid, epiretinal membrane, no SRF, normal foveal contour, intraretinal hyper-reflective material (interval improvement in IRF and central foveal contour).   Notes *Images captured and stored on drive  Diagnosis / Impression:  DME OU improving OD: interval improvement in IRF OS: interval improvement in IRF and central foveal contour  Clinical management:  See below  Abbreviations: NFP - Normal foveal profile. CME - cystoid macular edema. PED - pigment epithelial detachment. IRF - intraretinal fluid. SRF -  subretinal fluid. EZ - ellipsoid zone. ERM - epiretinal membrane. ORA - outer retinal atrophy. ORT - outer retinal tubulation. SRHM - subretinal hyper-reflective material        Intravitreal Injection, Pharmacologic Agent - OD - Right Eye       Time Out 02/05/2018. 9:20 AM. Confirmed correct patient, procedure, site, and patient consented.   Anesthesia Topical anesthesia was used. Anesthetic medications included Lidocaine 2%, Proparacaine 0.5%.   Procedure Preparation included 5% betadine to ocular surface, eyelid speculum. A supplied needle was used.   Injection:  2 mg aflibercept 2 MG/0.05ML   NDC: 61755-005-02, Lot: [id number], Expiration date: 01/02/2019   Route: Intravitreal, Site: Right Eye, Waste: 0.05 mL  Post-op Post injection exam found visual acuity of at least counting fingers. The patient tolerated the procedure well. There were no complications. The patient received written and verbal post procedure care education.        Intravitreal Injection, Pharmacologic Agent - OS - Left Eye       Time Out 02/05/2018. 9:20 AM. Confirmed correct patient, procedure, site, and patient consented.   Anesthesia Topical anesthesia was used. Anesthetic medications included Lidocaine 2%, Proparacaine 0.5%.   Procedure Preparation included 5% betadine to ocular surface, eyelid speculum. A 30 gauge  needle was used.   Injection:  2 mg aflibercept 2 MG/0.05ML   NDC: 61755-005-02, Lot: [id number], Expiration date: 01/02/2019   Route: Intravitreal, Site: Left Eye, Waste: 0.05 mL  Post-op Post injection exam found visual acuity of at least counting fingers. The patient tolerated the procedure well. There were no complications.                 ASSESSMENT/PLAN:    ICD-10-CM   1. 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 Retinal edema H35.81 OCT, Retina - OU - Both Eyes  3. Essential hypertension I10   4. Hypertensive retinopathy of both eyes H35.033   5. 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 Severe Non-proliferative diabetic retinopathy, both eyes - initial exam showed massive central DME OU and scattered IRH and IRMA - FA 6.5.19 with patches of capillary nonperfusion; extensive Mas with late leakage OU; no frank NV - S/P IVA OS #1 (06.05.19), #2 (07.08.19), #3 (07.08.19), #4 (09.03.19) - S/P IVA OD #1 (06.07.19), #2 (07.08.19), #3 (07.08.19) - S/P IVE OD #1 (09.03.19), #2 (10.03.19) - S/P IVE OS #1 (10.03.19) - OCT shows improvement in foveal contour and IRF OU  - BCVA OD stable at  20/25+1, OS improved to 20/30+2 - discussed findings - recommend IVE OU today (11.04.19) -- OD #3, OS #2 - pt wishes to proceed - RBA of procedure discussed, questions answered - informed consent obtained and signed - see procedure note -- tolerated well - Eylea paperwork and benefits investigation started on 08.05.19 -- approved w/ Commercial assistance program - f/u in 4 weeks, DFE, OCT, FA  3,4. Hypertensive retinopathy OU - discussed importance of tight BP control -  monitor  5. 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 - discussed diagnosis and progression - not yet visually significant - monitor  for now   Ophthalmic Meds Ordered this visit:  Meds ordered this encounter  Medications  . aflibercept (EYLEA) SOLN 2 mg  . aflibercept (EYLEA) SOLN 2 mg       Return in about 4 weeks (around 03/05/2018) for F/U NPDR OU, DFE, OCT, FA.  There are no Patient Instructions on file for this visit.   Explained the diagnoses, plan, and follow up with the patient and they expressed understanding.  Patient expressed understanding of the importance of proper follow up care.   This document serves as a record of services personally performed by Gardiner Sleeper, MD, PhD. It was created on their behalf by Ernest Mallick, OA, an ophthalmic assistant. The creation of this record is the provider's dictation and/or activities during the visit.    Electronically signed by: Ernest Mallick, OA  10.31.19 9:31 AM    Gardiner Sleeper, M.D., Ph.D. Diseases & Surgery of the Retina and Vitreous [facility]   I have reviewed the above documentation for accuracy and completeness, and I agree with the above. Gardiner Sleeper, M.D., Ph.D. 02/05/18 9:41 AM    Abbreviations: M myopia (nearsighted); A astigmatism; H hyperopia (farsighted); P presbyopia; Mrx spectacle prescription;  CTL contact lenses; OD right eye; OS left eye; OU both eyes  XT exotropia; ET esotropia; PEK punctate epithelial keratitis; PEE punctate epithelial erosions; DES dry eye syndrome; MGD meibomian gland dysfunction; ATs artificial tears; PFAT's preservative free artificial tears; [place] nuclear sclerotic cataract; PSC posterior subcapsular cataract; ERM epi-retinal membrane; PVD posterior vitreous detachment; RD retinal detachment; DM diabetes mellitus; DR diabetic retinopathy; NPDR  non-proliferative diabetic retinopathy; PDR proliferative diabetic retinopathy; CSME clinically significant macular edema; DME diabetic macular edema; dbh dot blot hemorrhages; CWS cotton wool spot; POAG primary open angle glaucoma; C/D cup-to-disc ratio; HVF humphrey visual field; GVF goldmann visual field; OCT optical coherence tomography; IOP intraocular pressure; BRVO Branch retinal vein occlusion; CRVO central retinal vein occlusion; CRAO central retinal artery occlusion; BRAO branch retinal artery occlusion; RT retinal tear; SB scleral buckle; PPV pars plana vitrectomy; VH Vitreous hemorrhage; PRP panretinal laser photocoagulation; IVK intravitreal kenalog; VMT vitreomacular traction; MH Macular hole;  NVD neovascularization of the disc; NVE neovascularization elsewhere; AREDS age related eye disease study; ARMD age related macular degeneration; POAG primary open angle glaucoma; EBMD epithelial/anterior basement membrane dystrophy; ACIOL anterior chamber intraocular lens; IOL intraocular lens; PCIOL posterior chamber intraocular lens; Phaco/IOL phacoemulsification with intraocular lens placement; [place] photorefractive keratectomy; LASIK laser assisted in situ keratomileusis; HTN hypertension; DM diabetes mellitus; COPD chronic obstructive pulmonary disease

## 2018-02-05 ENCOUNTER — Encounter (INDEPENDENT_AMBULATORY_CARE_PROVIDER_SITE_OTHER): Payer: Self-pay | Admitting: Ophthalmology

## 2018-02-05 ENCOUNTER — Ambulatory Visit (INDEPENDENT_AMBULATORY_CARE_PROVIDER_SITE_OTHER): Payer: BC Managed Care – PPO | Admitting: Ophthalmology

## 2018-02-05 DIAGNOSIS — I10 Essential (primary) hypertension: Secondary | ICD-10-CM

## 2018-02-05 DIAGNOSIS — H3581 Retinal edema: Secondary | ICD-10-CM

## 2018-02-05 DIAGNOSIS — H25813 Combined forms of age-related cataract, bilateral: Secondary | ICD-10-CM

## 2018-02-05 DIAGNOSIS — E113413 Type 2 diabetes mellitus with severe nonproliferative diabetic retinopathy with macular edema, bilateral: Secondary | ICD-10-CM

## 2018-02-05 DIAGNOSIS — H35033 Hypertensive retinopathy, bilateral: Secondary | ICD-10-CM

## 2018-02-05 MED ORDER — AFLIBERCEPT 2MG/0.05ML IZ SOLN FOR KALEIDOSCOPE
2.0000 mg | INTRAVITREAL | Status: DC
  Administered 2018-02-05: 2 mg via INTRAVITREAL

## 2018-02-27 NOTE — Progress Notes (Signed)
[facility] Note  03/05/2018     CHIEF COMPLAINT Patient presents for Retina Follow Up   HISTORY OF PRESENT ILLNESS: Philip Wilson is a 47 y.o. male who presents to the clinic today for:   HPI    Retina Follow Up    Patient presents with  Diabetic Retinopathy.  In both eyes.  Severity is moderate.  Duration of 4 weeks.  Since onset it is gradually improving.          Comments    Patient states vision the same OU. BS around 70 in am. Last A1c was 6.5 around 3 months ago.        Last edited by Bernarda Caffey, MD on 03/05/2018  2:48 PM. (History)      Referring physician: Lillard Anes, MD [address] [place], [place] [address]  HISTORICAL INFORMATION:   Selected notes from the MEDICAL RECORD NUMBER Referred by Dr. Ricki Epling for diabetic retinopathy OU LEE: 05.28.19 (A. Hager) [BCVA: OD: 20/40 OS: 20/50+2] Ocular Hx-Diabetic Retinopathy PMH-DM    CURRENT MEDICATIONS: No current outpatient medications on file. (Ophthalmic Drugs)   Current Facility-Administered Medications (Ophthalmic Drugs)  Medication Route  . aflibercept (EYLEA) SOLN 2 mg Intravitreal  . aflibercept (EYLEA) SOLN 2 mg Intravitreal  . aflibercept (EYLEA) SOLN 2 mg Intravitreal  . aflibercept (EYLEA) SOLN 2 mg Intravitreal  . aflibercept (EYLEA) SOLN 2 mg Intravitreal  . aflibercept (EYLEA) SOLN 2 mg Intravitreal  . aflibercept (EYLEA) SOLN 2 mg Intravitreal   Current Outpatient Medications (Other)  Medication Sig  . ACCU-CHEK GUIDE test strip every morning. Use to test fasting blood glucose  . atorvastatin (LIPITOR) 40 MG tablet   . glimepiride (AMARYL) 4 MG tablet Take by mouth.  Marland Kitchen lisinopril-hydrochlorothiazide (PRINZIDE,ZESTORETIC) 20-12.5 MG tablet   . metFORMIN (GLUCOPHAGE) 1000 MG tablet TK 1 T PO BID   Current Facility-Administered Medications (Other)  Medication Route  . Bevacizumab (AVASTIN) SOLN 1.25 mg Intravitreal  .  Bevacizumab (AVASTIN) SOLN 1.25 mg Intravitreal  . Bevacizumab (AVASTIN) SOLN 1.25 mg Intravitreal  . Bevacizumab (AVASTIN) SOLN 1.25 mg Intravitreal  . Bevacizumab (AVASTIN) SOLN 1.25 mg Intravitreal  . Bevacizumab (AVASTIN) SOLN 1.25 mg Intravitreal  . Bevacizumab (AVASTIN) SOLN 1.25 mg Intravitreal      REVIEW OF SYSTEMS: ROS    Positive for: Endocrine, Cardiovascular, Eyes   Negative for: Constitutional, Gastrointestinal, Neurological, Skin, Genitourinary, Musculoskeletal, HENT, Respiratory, Psychiatric, Allergic/Imm, Heme/Lymph   Last edited by Roselee Nova D on 03/05/2018  2:37 PM. (History)       ALLERGIES No Known Allergies  PAST MEDICAL HISTORY Past Medical History:  Diagnosis Date  . Diabetes 1.5, managed as type 2 ([place])   . Hypertension    History reviewed. No pertinent surgical history.  FAMILY HISTORY Family History  Problem Relation Age of Onset  . Heart failure Mother     SOCIAL HISTORY Social History   Tobacco Use  . Smoking status: Former Research scientist (life sciences)  . Smokeless tobacco: Never Used  Substance Use Topics  . Alcohol use: Yes    Alcohol/week: 1.0 standard drinks    Types: 1 Cans of beer per week  . Drug use: Never         OPHTHALMIC EXAM:  Base Eye Exam    Visual Acuity (Snellen - Linear)      Right Left   Dist [place] 20/30 +2 20/40 +2   Dist ph [place] 20/20 -1 20/20  Tonometry (Tonopen, 2:54 PM)      Right Left   Pressure 21 19       Pupils      Dark Light Shape React APD   Right 3 2 Round Brisk None   Left 3 2 Round Brisk None       Visual Fields (Counting fingers)      Left Right    Full Full       Extraocular Movement      Right Left    Full, Ortho Full, Ortho       Neuro/Psych    Oriented x3:  Yes   Mood/Affect:  Normal       Dilation    Both eyes:  1.0% Mydriacyl, 2.5% Phenylephrine @ 2:54 PM        Slit Lamp and Fundus Exam    Slit Lamp Exam      Right Left   Lids/Lashes Dermatochalasis - upper lid, mild  Meibomian gland dysfunction Dermatochalasis - upper lid, mild Meibomian gland dysfunction   Conjunctiva/Sclera White and quiet White and quiet   Cornea Clear Clear   Anterior Chamber Deep and quiet, no cell or flare Deep and quiet, no cell or flare   Iris Round and dilated to 59mm Round and dilated to 49mm   Lens 1+ Nuclear sclerosis, 2+ Cortical cataract 1+ Nuclear sclerosis, 2+ Cortical cataract   Vitreous Mild Vitreous syneresis Mild Vitreous syneresis, old white VH inferiorly,       Fundus Exam      Right Left   Disc Pink and Sharp Pink and Sharp, hyperemia   C/D Ratio 0.2 0.2   Macula Blunted foveal reflex, central edema -- improved, still with significant exudates, DBH/Microaneurysms - improved, small CWS superior macula Blunted foveal reflex, central edema - improved still with significant exudates greatest temporal to fovea, DBH/MA's improved, few CWS superior macula   Vessels Tortuous, AV crossing changes, Vascular attenuation Tortuous, AV crossing changes   Periphery Attached, scattered MAs and DBH - mostly posterior, scattered CWS -- all improved Attached, scattered MAs and DBH -- mostly posterior -- all improved        Refraction    Manifest Refraction (Retinoscopy)      Sphere Cylinder Axis Dist VA   Right -0.50 +0.50 025 20/20-2   Left -0.50 +1.00 180 20/25-1          IMAGING AND PROCEDURES  Imaging and Procedures for @TODAY @  OCT, Retina - OU - Both Eyes       Right Eye Quality was good. Central Foveal Thickness: 350. Progression has improved. Findings include abnormal foveal contour, intraretinal fluid, outer retinal atrophy, no SRF, retinal drusen , intraretinal hyper-reflective material (Interval improvement in IRF, VMA released).   Left Eye Quality was good. Central Foveal Thickness: 280. Progression has improved. Findings include intraretinal fluid, epiretinal membrane, no SRF, normal foveal contour, intraretinal hyper-reflective material (interval  improvement in IRF and central foveal contour, area of greatest IRF ST to fovea).   Notes *Images captured and stored on drive  Diagnosis / Impression:  DME OU improving OD: interval improvement in IRF OS: interval improvement in IRF and central foveal contour  Clinical management:  See below  Abbreviations: NFP - Normal foveal profile. CME - cystoid macular edema. PED - pigment epithelial detachment. IRF - intraretinal fluid. SRF - subretinal fluid. EZ - ellipsoid zone. ERM - epiretinal membrane. ORA - outer retinal atrophy. ORT - outer retinal tubulation. SRHM - subretinal hyper-reflective material  Intravitreal Injection, Pharmacologic Agent - OD - Right Eye       Time Out 03/05/2018. 4:49 PM. Confirmed correct patient, procedure, site, and patient consented.   Anesthesia Topical anesthesia was used. Anesthetic medications included Lidocaine 2%, Proparacaine 0.5%.   Procedure Preparation included 5% betadine to ocular surface, eyelid speculum. A 30 gauge needle was used.   Injection:  2 mg aflibercept 2 MG/0.05ML   NDC: 61755-005-02, Lot: [id number], Expiration date: 02/01/2019   Route: Intravitreal, Site: Right Eye, Waste: 0.05 mL  Post-op Post injection exam found visual acuity of at least counting fingers. The patient tolerated the procedure well. There were no complications. The patient received written and verbal post procedure care education.        Intravitreal Injection, Pharmacologic Agent - OS - Left Eye       Time Out 03/05/2018. 4:49 PM. Confirmed correct patient, procedure, site, and patient consented.   Anesthesia Topical anesthesia was used. Anesthetic medications included Lidocaine 2%, Proparacaine 0.5%.   Procedure Preparation included 5% betadine to ocular surface, eyelid speculum. A 30 gauge needle was used.   Injection:  2 mg aflibercept 2 MG/0.05ML   NDC: 61755-005-02, Lot: [id number], Expiration date: 02/01/2019   Route:  Intravitreal, Site: Left Eye, Waste: 0.05 mL  Post-op Post injection exam found visual acuity of at least counting fingers. The patient tolerated the procedure well. There were no complications. The patient received written and verbal post procedure care education.                 ASSESSMENT/PLAN:    ICD-10-CM   1. Severe nonproliferative diabetic retinopathy of both eyes with macular edema associated with type 2 diabetes mellitus (HCC) H20.9470 Intravitreal Injection, Pharmacologic Agent - OD - Right Eye    Intravitreal Injection, Pharmacologic Agent - OS - Left Eye    aflibercept (EYLEA) SOLN 2 mg    aflibercept (EYLEA) SOLN 2 mg  2. Retinal edema H35.81 OCT, Retina - OU - Both Eyes  3. Essential hypertension I10   4. Hypertensive retinopathy of both eyes H35.033   5. 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 Severe Non-proliferative diabetic retinopathy, both eyes - initial exam showed massive central DME OU and scattered IRH and IRMA - FA 6.5.19 with patches of capillary nonperfusion; extensive Mas with late leakage OU; no frank NV - S/P IVA OS #1 (06.05.19), #2 (07.08.19), #3 (07.08.19), #4 (09.03.19) - S/P IVA OD #1 (06.07.19), #2 (07.08.19), #3 (07.08.19) - S/P IVE OD #1 (09.03.19), #2 (10.03.19), #3 (11.04.19) - S/P IVE OS #1 (10.03.19), #2 (11.04.19) - OCT shows improvement in foveal contour and IRF OU  - FA unable to be obtained - poor vascular IV access - BCVA improved OU to 20/20 - discussed findings - recommend IVE OU today (12.02.19) -- OD #4, OS #3 - pt wishes to proceed - RBA of procedure discussed, questions answered - informed consent obtained and signed - see procedure note -- tolerated well - Eylea paperwork and benefits investigation started on 08.05.19 -- approved w/ Commercial assistance program - f/u in 4 weeks, DFE, OCT, FA, possible focal laser  3,4. Hypertensive retinopathy OU - discussed importance of tight BP  control - monitor  5. 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 - discussed diagnosis and progression - not yet visually significant - monitor for now   Ophthalmic Meds Ordered this  visit:  Meds ordered this encounter  Medications  . aflibercept (EYLEA) SOLN 2 mg  . aflibercept (EYLEA) SOLN 2 mg       Return in about 4 weeks (around 04/02/2018) for F/U NPDR OU, DFE, OCT, FA.  There are no Patient Instructions on file for this visit.   Explained the diagnoses, plan, and follow up with the patient and they expressed understanding.  Patient expressed understanding of the importance of proper follow up care.   This document serves as a record of services personally performed by Gardiner Sleeper, MD, PhD. It was created on their behalf by Ernest Mallick, OA, an ophthalmic assistant. The creation of this record is the provider's dictation and/or activities during the visit.    Electronically signed by: Ernest Mallick, OA  11.26.19 4:50 PM    Gardiner Sleeper, M.D., Ph.D. Diseases & Surgery of the Retina and Vitreous [facility]  I have reviewed the above documentation for accuracy and completeness, and I agree with the above. Gardiner Sleeper, M.D., Ph.D. 03/05/18 4:51 PM    Abbreviations: M myopia (nearsighted); A astigmatism; H hyperopia (farsighted); P presbyopia; Mrx spectacle prescription;  CTL contact lenses; OD right eye; OS left eye; OU both eyes  XT exotropia; ET esotropia; PEK punctate epithelial keratitis; PEE punctate epithelial erosions; DES dry eye syndrome; MGD meibomian gland dysfunction; ATs artificial tears; PFAT's preservative free artificial tears; [place] nuclear sclerotic cataract; PSC posterior subcapsular cataract; ERM epi-retinal membrane; PVD posterior vitreous detachment; RD retinal detachment; DM diabetes mellitus; DR diabetic retinopathy; NPDR  non-proliferative diabetic retinopathy; PDR proliferative diabetic retinopathy; CSME clinically significant macular edema; DME diabetic macular edema; dbh dot blot hemorrhages; CWS cotton wool spot; POAG primary open angle glaucoma; C/D cup-to-disc ratio; HVF humphrey visual field; GVF goldmann visual field; OCT optical coherence tomography; IOP intraocular pressure; BRVO Branch retinal vein occlusion; CRVO central retinal vein occlusion; CRAO central retinal artery occlusion; BRAO branch retinal artery occlusion; RT retinal tear; SB scleral buckle; PPV pars plana vitrectomy; VH Vitreous hemorrhage; PRP panretinal laser photocoagulation; IVK intravitreal kenalog; VMT vitreomacular traction; MH Macular hole;  NVD neovascularization of the disc; NVE neovascularization elsewhere; AREDS age related eye disease study; ARMD age related macular degeneration; POAG primary open angle glaucoma; EBMD epithelial/anterior basement membrane dystrophy; ACIOL anterior chamber intraocular lens; IOL intraocular lens; PCIOL posterior chamber intraocular lens; Phaco/IOL phacoemulsification with intraocular lens placement; [place] photorefractive keratectomy; LASIK laser assisted in situ keratomileusis; HTN hypertension; DM diabetes mellitus; COPD chronic obstructive pulmonary disease

## 2018-03-05 ENCOUNTER — Ambulatory Visit (INDEPENDENT_AMBULATORY_CARE_PROVIDER_SITE_OTHER): Payer: BC Managed Care – PPO | Admitting: Ophthalmology

## 2018-03-05 ENCOUNTER — Encounter (INDEPENDENT_AMBULATORY_CARE_PROVIDER_SITE_OTHER): Payer: Self-pay | Admitting: Ophthalmology

## 2018-03-05 DIAGNOSIS — H35033 Hypertensive retinopathy, bilateral: Secondary | ICD-10-CM

## 2018-03-05 DIAGNOSIS — E113413 Type 2 diabetes mellitus with severe nonproliferative diabetic retinopathy with macular edema, bilateral: Secondary | ICD-10-CM

## 2018-03-05 DIAGNOSIS — H3581 Retinal edema: Secondary | ICD-10-CM

## 2018-03-05 DIAGNOSIS — I10 Essential (primary) hypertension: Secondary | ICD-10-CM

## 2018-03-05 DIAGNOSIS — H25813 Combined forms of age-related cataract, bilateral: Secondary | ICD-10-CM

## 2018-03-05 MED ORDER — AFLIBERCEPT 2MG/0.05ML IZ SOLN FOR KALEIDOSCOPE
2.0000 mg | INTRAVITREAL | Status: DC
  Administered 2018-03-05: 2 mg via INTRAVITREAL

## 2018-04-02 NOTE — Progress Notes (Addendum)
[facility] Note  04/05/2018     CHIEF COMPLAINT Patient presents for Retina Follow Up   HISTORY OF PRESENT ILLNESS: Philip Wilson is a 47 y.o. male who presents to the clinic today for:   HPI    Retina Follow Up    Patient presents with  Diabetic Retinopathy.  In both eyes.  Severity is moderate.  Duration of 4 weeks.  Since onset it is stable.  I, the attending physician,  performed the HPI with the patient and updated documentation appropriately.          Comments    Patient states vision the same OU. BS was 72 this am. Last A1c was 5.8 last week.       Last edited by Bernarda Caffey, MD on 04/05/2018 10:04 AM. (History)    pt states his vision seems the same as last time, pts wife states that his blood pressure has been running 200/113, wife states he started a new medication on December 23rd (and is now on 2 medications twice a day) and last time he checked it was 169/83, pt states he was taking his medication regularly and he is not sure why it is so high, he states his A1C was 5.8, pt has another appt on Monday to see if he can drop one of the medications  Referring physician: Lillard Anes, MD [address] [place], [place] [address]  HISTORICAL INFORMATION:   Selected notes from the MEDICAL RECORD NUMBER Referred by Dr. Ricki Orsini for diabetic retinopathy OU LEE: 05.28.19 (A. Hager) [BCVA: OD: 20/40 OS: 20/50+2] Ocular Hx-Diabetic Retinopathy PMH-DM    CURRENT MEDICATIONS: No current outpatient medications on file. (Ophthalmic Drugs)   Current Facility-Administered Medications (Ophthalmic Drugs)  Medication Route  . aflibercept (EYLEA) SOLN 2 mg Intravitreal  . aflibercept (EYLEA) SOLN 2 mg Intravitreal  . aflibercept (EYLEA) SOLN 2 mg Intravitreal  . aflibercept (EYLEA) SOLN 2 mg Intravitreal  . aflibercept (EYLEA) SOLN 2 mg Intravitreal  . aflibercept (EYLEA) SOLN 2 mg Intravitreal  . aflibercept (EYLEA) SOLN  2 mg Intravitreal  . aflibercept (EYLEA) SOLN 2 mg Intravitreal  . aflibercept (EYLEA) SOLN 2 mg Intravitreal   Current Outpatient Medications (Other)  Medication Sig  . ACCU-CHEK GUIDE test strip every morning. Use to test fasting blood glucose  . atorvastatin (LIPITOR) 40 MG tablet   . glimepiride (AMARYL) 4 MG tablet Take by mouth.  . labetalol (NORMODYNE) 100 MG tablet Take 100 mg by mouth 2 (two) times daily.  Marland Kitchen lisinopril-hydrochlorothiazide (PRINZIDE,ZESTORETIC) 20-12.5 MG tablet   . metFORMIN (GLUCOPHAGE) 1000 MG tablet TK 1 T PO BID   Current Facility-Administered Medications (Other)  Medication Route  . Bevacizumab (AVASTIN) SOLN 1.25 mg Intravitreal  . Bevacizumab (AVASTIN) SOLN 1.25 mg Intravitreal  . Bevacizumab (AVASTIN) SOLN 1.25 mg Intravitreal  . Bevacizumab (AVASTIN) SOLN 1.25 mg Intravitreal  . Bevacizumab (AVASTIN) SOLN 1.25 mg Intravitreal  . Bevacizumab (AVASTIN) SOLN 1.25 mg Intravitreal  . Bevacizumab (AVASTIN) SOLN 1.25 mg Intravitreal      REVIEW OF SYSTEMS: ROS    Positive for: Endocrine, Cardiovascular, Eyes   Negative for: Constitutional, Gastrointestinal, Neurological, Skin, Genitourinary, Musculoskeletal, HENT, Respiratory, Psychiatric, Allergic/Imm, Heme/Lymph   Last edited by Roselee Nova D on 04/05/2018  9:06 AM. (History)       ALLERGIES No Known Allergies  PAST MEDICAL HISTORY Past Medical History:  Diagnosis Date  . Diabetes 1.5, managed as type 2 ([place])   .  Hypertension    History reviewed. No pertinent surgical history.  FAMILY HISTORY Family History  Problem Relation Age of Onset  . Heart failure Mother     SOCIAL HISTORY Social History   Tobacco Use  . Smoking status: Former Research scientist (life sciences)  . Smokeless tobacco: Never Used  Substance Use Topics  . Alcohol use: Yes    Alcohol/week: 1.0 standard drinks    Types: 1 Cans of beer per week  . Drug use: Never         OPHTHALMIC EXAM:  Base Eye Exam    Visual Acuity (Snellen  - Linear)      Right Left   Dist [place] 20/25 +1 20/30   Dist ph [place] 20/20 -2 20/20       Tonometry (Tonopen, 9:15 AM)      Right Left   Pressure 18 20       Pupils      Dark Light Shape React APD   Right 3 2 Round Brisk None   Left 3 2 Round Brisk None       Visual Fields (Counting fingers)      Left Right    Full Full       Extraocular Movement      Right Left    Full, Ortho Full, Ortho       Neuro/Psych    Oriented x3:  Yes   Mood/Affect:  Normal       Dilation    Both eyes:  1.0% Mydriacyl, 2.5% Phenylephrine @ 9:15 AM        Slit Lamp and Fundus Exam    Slit Lamp Exam      Right Left   Lids/Lashes Dermatochalasis - upper lid, mild Meibomian gland dysfunction Dermatochalasis - upper lid, mild Meibomian gland dysfunction   Conjunctiva/Sclera White and quiet White and quiet   Cornea Clear Clear   Anterior Chamber Deep and quiet, no cell or flare Deep and quiet, no cell or flare   Iris Round and dilated to 94mm Round and dilated to 73mm   Lens 1+ Nuclear sclerosis, 2+ Cortical cataract 1+ Nuclear sclerosis, 2+ Cortical cataract   Vitreous Mild Vitreous syneresis Mild Vitreous syneresis, old white VH inferiorly,       Fundus Exam      Right Left   Disc Pink and Sharp Pink and Sharp, hyperemia   C/D Ratio 0.2 0.2   Macula Blunted foveal reflex, central edema -- improved, still with significant exudates, DBH/Microaneurysms - improved, small CWS superior macula Blunted foveal reflex, central edema - improved still with significant exudates greatest temporal to fovea, DBH/MA's improved, few CWS superior macula   Vessels Tortuous, AV crossing changes, Vascular attenuation Tortuous, AV crossing changes   Periphery Attached, scattered MAs and DBH - mostly posterior, scattered CWS -- all improved Attached, scattered MAs and DBH -- mostly posterior -- all improved          IMAGING AND PROCEDURES  Imaging and Procedures for @TODAY @  OCT, Retina - OU - Both Eyes        Right Eye Quality was good. Central Foveal Thickness: 319. Progression has improved. Findings include abnormal foveal contour, intraretinal fluid, outer retinal atrophy, no SRF, retinal drusen , intraretinal hyper-reflective material (Interval improvement in foveal contour, IRF and ORA).   Left Eye Quality was good. Central Foveal Thickness: 300. Progression has worsened. Findings include intraretinal fluid, epiretinal membrane, no SRF, normal foveal contour, intraretinal hyper-reflective material (interval increase in IRF/IRHM).   Notes *  Images captured and stored on drive  Diagnosis / Impression:  DME OU OD: Interval improvement in foveal contour, IRF and ORA OS: interval increase in IRF/IRHM  Clinical management:  See below  Abbreviations: NFP - Normal foveal profile. CME - cystoid macular edema. PED - pigment epithelial detachment. IRF - intraretinal fluid. SRF - subretinal fluid. EZ - ellipsoid zone. ERM - epiretinal membrane. ORA - outer retinal atrophy. ORT - outer retinal tubulation. SRHM - subretinal hyper-reflective material        Intravitreal Injection, Pharmacologic Agent - OD - Right Eye       Time Out 04/05/2018. 10:38 AM. Confirmed correct patient, procedure, site, and patient consented.   Anesthesia Topical anesthesia was used. Anesthetic medications included Lidocaine 2%, Proparacaine 0.5%.   Procedure Preparation included 5% betadine to ocular surface, eyelid speculum. A 30 gauge needle was used.   Injection:  2 mg aflibercept Alfonse Flavors) SOLN   NDC: M7179715, Lot: [id number], Expiration date: 03/04/2019   Route: Intravitreal, Site: Right Eye, Waste: 0.05 mL  Post-op Post injection exam found visual acuity of at least counting fingers. The patient tolerated the procedure well. There were no complications. The patient received written and verbal post procedure care education.        Intravitreal Injection, Pharmacologic Agent - OS - Left Eye        Time Out 04/05/2018. 10:37 AM. Confirmed correct patient, procedure, site, and patient consented.   Anesthesia Topical anesthesia was used. Anesthetic medications included Lidocaine 2%, Proparacaine 0.5%.   Procedure Preparation included 5% betadine to ocular surface, eyelid speculum. A 30 gauge needle was used.   Injection:  2 mg aflibercept Alfonse Flavors) SOLN   NDC: M7179715, Lot: [id number], Expiration date: 03/04/2019   Route: Intravitreal, Site: Left Eye, Waste: 0.05 mL  Post-op Post injection exam found visual acuity of at least counting fingers. The patient tolerated the procedure well. There were no complications. The patient received written and verbal post procedure care education.        Fluorescein Angiography Optos (Transit OS)       Right Eye   Progression has improved. Early phase findings include microaneurysm, vascular perfusion defect (IRMA). Mid/Late phase findings include leakage, microaneurysm, vascular perfusion defect (IRMA).   Left Eye   Progression has improved. Early phase findings include microaneurysm, vascular perfusion defect (IRMA). Mid/Late phase findings include leakage, microaneurysm, vascular perfusion defect (IRMA).   Notes Images stored on drive;   Impression: Severe NPDR OU interval improvement in late leaking microaneurysms OU No frank NV OU                  ASSESSMENT/PLAN:    ICD-10-CM   1. 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 Retinal edema H35.81 OCT, Retina - OU - Both Eyes  3. Essential hypertension I10   4. Hypertensive retinopathy of both eyes H35.033 Fluorescein Angiography  Optos (Transit OS)  5. 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 Severe Non-proliferative diabetic retinopathy, both eyes - initial exam showed massive central DME OU and scattered IRH and IRMA - FA 6.5.19 with patches of capillary nonperfusion; extensive Mas  with late leakage OU; no frank NV - S/P IVA OS #1 (06.05.19), #2 (07.08.19), #3 (07.08.19), #4 (09.03.19) - S/P IVA OD #1 (06.07.19), #2 (07.08.19), #3 (07.08.19) - S/P IVE OD #1 (09.03.19), #2 (10.03.19), #3 (11.04.19), #4 (12.02.19) - S/P IVE OS #1 (10.03.19), #2 (11.04.19), #3 (12.02.19) - OCT shows improvement in foveal contour and IRF OD; mild increase in IRF OS  - FA today, 01.02.20, shows improvement in late leaking microaneurysms OU - BCVA stable at 20/20 OU - discussed findings - recommend IVE OU today (01.02.20) -- OD #5, OS #4 - pt wishes to proceed - RBA of procedure discussed, questions answered - informed consent obtained and signed - see procedure note -- tolerated well - Eylea paperwork and benefits investigation started on 08.05.19 -- approved w/ Commercial assistance program -- approved for 2020 - f/u in 4 weeks  3,4. Hypertensive retinopathy OU - discussed importance of tight BP control - has had some Bps in hypertensive emergency range (200s / 110s) - discussed likely contribution to DME - BP measured in office today -- 180s / 90s - monitor - BP management per PCP  5. 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 - discussed diagnosis and progression - not yet visually significant - monitor for now   Ophthalmic Meds Ordered this visit:  Meds ordered this encounter  Medications  . aflibercept (EYLEA) SOLN 2 mg  . aflibercept (EYLEA) SOLN 2 mg       Return in about 4 weeks (around 05/03/2018) for Dilated Exam, OCT, Possible Injxn.  There are no Patient Instructions on file for  this visit.   Explained the diagnoses, plan, and follow up with the patient and they expressed understanding.  Patient expressed understanding of the importance of proper follow up care.   This document serves as a record of services personally performed by Gardiner Sleeper, MD, PhD. It was created on their behalf by Ernest Mallick, OA, an ophthalmic assistant. The creation of this record is the provider's dictation and/or activities during the visit.    Electronically signed by: Ernest Mallick, OA  12.30.19 11:18 PM     Gardiner Sleeper, M.D., Ph.D. Diseases & Surgery of the Retina and Vitreous [facility]  I have reviewed the above documentation for accuracy and completeness, and I agree with the above. Gardiner Sleeper, M.D., Ph.D. 04/05/18 11:18 PM   Abbreviations: M myopia (nearsighted); A astigmatism; H hyperopia (farsighted); P presbyopia; Mrx spectacle prescription;  CTL contact lenses; OD right eye; OS left eye; OU both eyes  XT exotropia; ET esotropia; PEK punctate epithelial keratitis; PEE punctate epithelial erosions; DES dry eye syndrome; MGD meibomian gland dysfunction; ATs artificial tears; PFAT's preservative free artificial tears; [place] nuclear sclerotic cataract; PSC posterior subcapsular cataract; ERM epi-retinal membrane; PVD posterior vitreous detachment; RD retinal detachment; DM diabetes mellitus; DR diabetic retinopathy; NPDR non-proliferative diabetic retinopathy; PDR proliferative diabetic retinopathy; CSME clinically significant macular edema; DME diabetic macular edema; dbh dot blot hemorrhages; CWS cotton wool spot; POAG primary open angle glaucoma; C/D cup-to-disc ratio; HVF humphrey visual field; GVF goldmann visual field; OCT optical coherence tomography; IOP intraocular pressure; BRVO Branch retinal vein occlusion; CRVO central retinal vein occlusion; CRAO central retinal artery occlusion; BRAO branch retinal artery occlusion; RT retinal tear; SB  scleral buckle; PPV pars plana vitrectomy; VH Vitreous hemorrhage; PRP panretinal laser photocoagulation; IVK intravitreal kenalog; VMT vitreomacular traction; MH Macular  hole;  NVD neovascularization of the disc; NVE neovascularization elsewhere; AREDS age related eye disease study; ARMD age related macular degeneration; POAG primary open angle glaucoma; EBMD epithelial/anterior basement membrane dystrophy; ACIOL anterior chamber intraocular lens; IOL intraocular lens; PCIOL posterior chamber intraocular lens; Phaco/IOL phacoemulsification with intraocular lens placement; [place] photorefractive keratectomy; LASIK laser assisted in situ keratomileusis; HTN hypertension; DM diabetes mellitus; COPD chronic obstructive pulmonary disease

## 2018-04-05 ENCOUNTER — Ambulatory Visit (INDEPENDENT_AMBULATORY_CARE_PROVIDER_SITE_OTHER): Payer: BC Managed Care – PPO | Admitting: Ophthalmology

## 2018-04-05 ENCOUNTER — Encounter (INDEPENDENT_AMBULATORY_CARE_PROVIDER_SITE_OTHER): Payer: Self-pay | Admitting: Ophthalmology

## 2018-04-05 DIAGNOSIS — H3581 Retinal edema: Secondary | ICD-10-CM | POA: Diagnosis not present

## 2018-04-05 DIAGNOSIS — H25813 Combined forms of age-related cataract, bilateral: Secondary | ICD-10-CM

## 2018-04-05 DIAGNOSIS — H35033 Hypertensive retinopathy, bilateral: Secondary | ICD-10-CM

## 2018-04-05 DIAGNOSIS — E113413 Type 2 diabetes mellitus with severe nonproliferative diabetic retinopathy with macular edema, bilateral: Secondary | ICD-10-CM | POA: Diagnosis not present

## 2018-04-05 DIAGNOSIS — I10 Essential (primary) hypertension: Secondary | ICD-10-CM

## 2018-04-05 MED ORDER — AFLIBERCEPT 2MG/0.05ML IZ SOLN FOR KALEIDOSCOPE
2.0000 mg | INTRAVITREAL | Status: DC
  Administered 2018-04-05: 2 mg via INTRAVITREAL

## 2018-04-30 ENCOUNTER — Ambulatory Visit: Payer: BC Managed Care – PPO | Admitting: Cardiology

## 2018-05-03 DIAGNOSIS — E119 Type 2 diabetes mellitus without complications: Secondary | ICD-10-CM | POA: Insufficient documentation

## 2018-05-03 DIAGNOSIS — G629 Polyneuropathy, unspecified: Secondary | ICD-10-CM | POA: Insufficient documentation

## 2018-05-04 ENCOUNTER — Ambulatory Visit: Payer: BC Managed Care – PPO | Admitting: Cardiology

## 2018-05-04 ENCOUNTER — Encounter (INDEPENDENT_AMBULATORY_CARE_PROVIDER_SITE_OTHER): Payer: BC Managed Care – PPO | Admitting: Ophthalmology

## 2018-05-04 ENCOUNTER — Encounter: Payer: Self-pay | Admitting: Cardiology

## 2018-05-04 VITALS — BP 200/96 | HR 83 | Ht 69.5 in | Wt 207.0 lb

## 2018-05-04 DIAGNOSIS — I10 Essential (primary) hypertension: Secondary | ICD-10-CM | POA: Diagnosis not present

## 2018-05-04 DIAGNOSIS — N289 Disorder of kidney and ureter, unspecified: Secondary | ICD-10-CM | POA: Diagnosis not present

## 2018-05-04 DIAGNOSIS — I129 Hypertensive chronic kidney disease with stage 1 through stage 4 chronic kidney disease, or unspecified chronic kidney disease: Secondary | ICD-10-CM | POA: Insufficient documentation

## 2018-05-04 DIAGNOSIS — E088 Diabetes mellitus due to underlying condition with unspecified complications: Secondary | ICD-10-CM | POA: Diagnosis not present

## 2018-05-04 DIAGNOSIS — R0609 Other forms of dyspnea: Secondary | ICD-10-CM | POA: Insufficient documentation

## 2018-05-04 DIAGNOSIS — N189 Chronic kidney disease, unspecified: Secondary | ICD-10-CM

## 2018-05-04 HISTORY — DX: Chronic kidney disease, unspecified: N18.9

## 2018-05-04 NOTE — Progress Notes (Signed)
Cardiology Office Note:    Date:  05/04/2018   ID:  Philip Wilson, DOB Apr 29, 1970, MRN [id number]  PCP:  Lillard Anes, MD  Cardiologist:  Jenean Lindau, MD   Referring MD: Lillard Anes,*    ASSESSMENT:    1. Dyspnea on exertion   2. Essential hypertension   3. Renal insufficiency   4. Diabetes mellitus due to underlying condition with unspecified complications ([place])    PLAN:    In order of problems listed above:  1. I discussed my findings with the patient at extensive length.  His blood pressure is elevated but his nephrologist has made a couple of changes to his medications today and I will let his nephrologist manage his blood pressure for sake of simplicity.  He also has follow-up blood work from his nephrologist for the up titration of medications.  I told the patient about lifestyle modification and keeping a track of his blood pressures at home and taking his blood pressure machine and the blood pressure readings to his nephrologist in the next few days that he has an appointment. 2. Patient will have echocardiogram done to assess murmur heard on auscultation.  This would also help him assess if he has any endorgan damage. 3. Lexiscan sestamibi will be done to assess shortness of breath on exertion. 4. He will have a renal artery ultrasound to look for any renal artery stenosis as a cause for his elevated blood pressure. 5. Diet was discussed for essential hypertension and diabetes mellitus.  His lipids are followed by his primary care physician. 6. Patient will be seen in follow-up appointment in 6 months or earlier if the patient has any concerns   Medication Adjustments/Labs and Tests Ordered: Current medicines are reviewed at length with the patient today.  Concerns regarding medicines are outlined above.  Orders Placed This Encounter  Procedures  . MYOCARDIAL PERFUSION IMAGING  . EKG 12-Lead  . ECHOCARDIOGRAM COMPLETE   No orders of the  defined types were placed in this encounter.    History of Present Illness:    Philip Wilson is a 48 y.o. male who is being seen today for the evaluation of dyspnea on exertion and essential hypertension at the request of Lillard Anes,*.  Patient is a pleasant 48 year old male.  He has past medical history of essential hypertension and diabetes mellitus.  He has smoked in the remote past but has not smoked since the past several years.  He mentions to me that he is having shortness of breath on exertion.  He is went to see his nephrologist today and is pretty upset about the fact that he has been told that he has significant advanced renal insufficiency and probably a candidate for renal transplant in the future.  He denies any chest pain or chest tightness.  At the time of my evaluation, the patient is alert awake oriented and in no distress.  Past Medical History:  Diagnosis Date  . Diabetes 1.5, managed as type 2 ([place])   . Hypertension     History reviewed. No pertinent surgical history.  Current Medications: Current Meds  Medication Sig  . ACCU-CHEK GUIDE test strip every morning. Use to test fasting blood glucose  . atorvastatin (LIPITOR) 40 MG tablet Take 40 mg by mouth daily at 6 PM.   . cloNIDine (CATAPRES) 0.1 MG tablet Take 1 tablet by mouth 2 (two) times daily.  . furosemide (LASIX) 40 MG tablet Take 40 mg by mouth daily.  Marland Kitchen  glimepiride (AMARYL) 4 MG tablet Take by mouth.  . labetalol (NORMODYNE) 200 MG tablet Take 200 mg by mouth 2 (two) times daily.   Marland Kitchen lisinopril-hydrochlorothiazide (PRINZIDE,ZESTORETIC) 20-12.5 MG tablet Take 1 tablet by mouth daily.   . metFORMIN (GLUCOPHAGE) 1000 MG tablet Take 1,000 mg by mouth 2 (two) times daily with a meal.    Current Facility-Administered Medications for the 05/04/18 encounter (Office Visit) with , Reita Cliche, MD  Medication  . aflibercept (EYLEA) SOLN 2 mg  . aflibercept (EYLEA) SOLN 2 mg  . aflibercept  (EYLEA) SOLN 2 mg  . aflibercept (EYLEA) SOLN 2 mg  . aflibercept (EYLEA) SOLN 2 mg  . aflibercept (EYLEA) SOLN 2 mg  . aflibercept (EYLEA) SOLN 2 mg  . aflibercept (EYLEA) SOLN 2 mg  . aflibercept (EYLEA) SOLN 2 mg  . Bevacizumab (AVASTIN) SOLN 1.25 mg  . Bevacizumab (AVASTIN) SOLN 1.25 mg  . Bevacizumab (AVASTIN) SOLN 1.25 mg  . Bevacizumab (AVASTIN) SOLN 1.25 mg  . Bevacizumab (AVASTIN) SOLN 1.25 mg  . Bevacizumab (AVASTIN) SOLN 1.25 mg  . Bevacizumab (AVASTIN) SOLN 1.25 mg     Allergies:   Patient has no known allergies.   Social History   Socioeconomic History  . Marital status: Married    Spouse name: Not on file  . Number of children: Not on file  . Years of education: Not on file  . Highest education level: Not on file  Occupational History  . Occupation: Geographical information systems officer  . Financial resource strain: Not on file  . Food insecurity:    Worry: Not on file    Inability: Not on file  . Transportation needs:    Medical: Not on file    Non-medical: Not on file  Tobacco Use  . Smoking status: Former Research scientist (life sciences)  . Smokeless tobacco: Never Used  Substance and Sexual Activity  . Alcohol use: Yes    Alcohol/week: 1.0 standard drinks    Types: 1 Cans of beer per week  . Drug use: Never  . Sexual activity: Not on file  Lifestyle  . Physical activity:    Days per week: Not on file    Minutes per session: Not on file  . Stress: Not on file  Relationships  . Social connections:    Talks on phone: Not on file    Gets together: Not on file    Attends religious service: Not on file    Active member of club or organization: Not on file    Attends meetings of clubs or organizations: Not on file    Relationship status: Not on file  Other Topics Concern  . Not on file  Social History Narrative  . Not on file     Family History: The patient's family history includes Heart failure in his mother.  ROS:   Please see the history of present illness.    All other  systems reviewed and are negative.  EKGs/Labs/Other Studies Reviewed:    The following studies were reviewed today: EKG reveals sinus rhythm and nonspecific ST-T changes.  LVH   Recent Labs: No results found for requested labs within last 8760 hours.  Recent Lipid Panel No results found for: CHOL, TRIG, HDL, CHOLHDL, VLDL, LDLCALC, LDLDIRECT  Physical Exam:    VS:  BP (!) 200/96 (BP Location: Right Arm, Patient Position: Sitting, Cuff Size: Normal)   Pulse 83   Ht 5' 9.5" (1.765 m)   Wt 207 lb (93.9 kg)   SpO2 97%  BMI 30.13 kg/m     Wt Readings from Last 3 Encounters:  05/04/18 207 lb (93.9 kg)     GEN: Patient is in no acute distress HEENT: Normal NECK: No JVD; No carotid bruits LYMPHATICS: No lymphadenopathy CARDIAC: S1 S2 regular, 2/6 systolic murmur at the apex. RESPIRATORY:  Clear to auscultation without rales, wheezing or rhonchi  ABDOMEN: Soft, non-tender, non-distended MUSCULOSKELETAL:  No edema; No deformity  SKIN: Warm and dry NEUROLOGIC:  Alert and oriented x 3 PSYCHIATRIC:  Normal affect    Signed, Jenean Lindau, MD  05/04/2018 3:42 PM    [facility]

## 2018-05-04 NOTE — Patient Instructions (Signed)
Medication Instructions:  Your physician recommends that you continue on your current medications as directed. Please refer to the Current Medication list given to you today.  If you need a refill on your cardiac medications before your next appointment, please call your pharmacy.   Lab work: None.  If you have labs (blood work) drawn today and your tests are completely normal, you will receive your results only by: Marland Kitchen MyChart Message (if you have MyChart) OR . A paper copy in the mail If you have any lab test that is abnormal or we need to change your treatment, we will call you to review the results.  Testing/Procedures: Your physician has requested that you have an echocardiogram. Echocardiography is a painless test that uses sound waves to create images of your heart. It provides your doctor with information about the size and shape of your heart and how well your heart's chambers and valves are working. This procedure takes approximately one hour. There are no restrictions for this procedure.  Your physician has requested that you have a lexiscan myoview. For further information please visit [email or web address]. Please follow instruction sheet, as given.   Your physician has requested that you have a renal artery duplex. During this test, an ultrasound is used to evaluate blood flow to the kidneys. Allow one hour for this exam. Do not eat after midnight the day before and avoid carbonated beverages. Take your medications as you usually do.   Follow-Up: At [facility], you and your health needs are our priority.  As part of our continuing mission to provide you with exceptional heart care, we have created designated Provider Care Teams.  These Care Teams include your primary Cardiologist (physician) and Advanced Practice Providers (APPs -  Physician Assistants and Nurse Practitioners) who all work together to provide you with the care you need, when you need it. You will need a follow  up appointment in 4 months.  Please call our office 2 months in advance to schedule this appointment.  You may see No primary care provider on file. or another member of our [employer] Provider Team in [place]: Jenne Campus, MD . Shirlee More, MD  Any Other Special Instructions Will Be Listed Below (If Applicable).   Echocardiogram An echocardiogram is a procedure that uses painless sound waves (ultrasound) to produce an image of the heart. Images from an echocardiogram can provide important information about:  Signs of coronary artery disease (CAD).  Aneurysm detection. An aneurysm is a weak or damaged part of an artery wall that bulges out from the normal force of blood pumping through the body.  Heart size and shape. Changes in the size or shape of the heart can be associated with certain conditions, including heart failure, aneurysm, and CAD.  Heart muscle function.  Heart valve function.  Signs of a past heart attack.  Fluid buildup around the heart.  Thickening of the heart muscle.  A tumor or infectious growth around the heart valves. Tell a health care provider about:  Any allergies you have.  All medicines you are taking, including vitamins, herbs, eye drops, creams, and over-the-counter medicines.  Any blood disorders you have.  Any surgeries you have had.  Any medical conditions you have.  Whether you are pregnant or may be pregnant. What are the risks? Generally, this is a safe procedure. However, problems may occur, including:  Allergic reaction to dye (contrast) that may be used during the procedure. What happens before the procedure? No specific preparation  is needed. You may eat and drink normally. What happens during the procedure?   An IV tube may be inserted into one of your veins.  You may receive contrast through this tube. A contrast is an injection that improves the quality of the pictures from your heart.  A gel will be applied to  your chest.  A wand-like tool (transducer) will be moved over your chest. The gel will help to transmit the sound waves from the transducer.  The sound waves will harmlessly bounce off of your heart to allow the heart images to be captured in real-time motion. The images will be recorded on a computer. The procedure may vary among health care providers and hospitals. What happens after the procedure?  You may return to your normal, everyday life, including diet, activities, and medicines, unless your health care provider tells you not to do that. Summary  An echocardiogram is a procedure that uses painless sound waves (ultrasound) to produce an image of the heart.  Images from an echocardiogram can provide important information about the size and shape of your heart, heart muscle function, heart valve function, and fluid buildup around your heart.  You do not need to do anything to prepare before this procedure. You may eat and drink normally.  After the echocardiogram is completed, you may return to your normal, everyday life, unless your health care provider tells you not to do that. This information is not intended to replace advice given to you by your health care provider. Make sure you discuss any questions you have with your health care provider. Document Released: 03/18/2000 Document Revised: 04/23/2016 Document Reviewed: 04/23/2016 Elsevier Interactive Patient Education  2019 [employer].    Cardiac Nuclear Scan A cardiac nuclear scan is a test that measures blood flow to the heart when a person is resting and when he or she is exercising. The test looks for problems such as:  Not enough blood reaching a portion of the heart.  The heart muscle not working normally. You may need this test if:  You have heart disease.  You have had abnormal lab results.  You have had heart surgery or a balloon procedure to open up blocked arteries (angioplasty).  You have chest  pain.  You have shortness of breath. In this test, a radioactive dye (tracer) is injected into your bloodstream. After the tracer has traveled to your heart, an imaging device is used to measure how much of the tracer is absorbed by or distributed to various areas of your heart. This procedure is usually done at a hospital and takes 2-4 hours. Tell a health care provider about:  Any allergies you have.  All medicines you are taking, including vitamins, herbs, eye drops, creams, and over-the-counter medicines.  Any problems you or family members have had with anesthetic medicines.  Any blood disorders you have.  Any surgeries you have had.  Any medical conditions you have.  Whether you are pregnant or may be pregnant. What are the risks? Generally, this is a safe procedure. However, problems may occur, including:  Serious chest pain and heart attack. This is only a risk if the stress portion of the test is done.  Rapid heartbeat.  Sensation of warmth in your chest. This usually passes quickly.  Allergic reaction to the tracer. What happens before the procedure?  Ask your health care provider about changing or stopping your regular medicines. This is especially important if you are taking diabetes medicines or blood thinners.  Follow instructions from your health care provider about eating or drinking restrictions.  Remove your jewelry on the day of the procedure. What happens during the procedure?  An IV will be inserted into one of your veins.  Your health care provider will inject a small amount of radioactive tracer through the IV.  You will wait for 20-40 minutes while the tracer travels through your bloodstream.  Your heart activity will be monitored with an electrocardiogram (ECG).  You will lie down on an exam table.  Images of your heart will be taken for about 15-20 minutes.  You may also have a stress test. For this test, one of the following may be  done: ? You will exercise on a treadmill or stationary bike. While you exercise, your heart's activity will be monitored with an ECG, and your blood pressure will be checked. ? You will be given medicines that will increase blood flow to parts of your heart. This is done if you are unable to exercise.  When blood flow to your heart has peaked, a tracer will again be injected through the IV.  After 20-40 minutes, you will get back on the exam table and have more images taken of your heart.  Depending on the type of tracer used, scans may need to be repeated 3-4 hours later.  Your IV line will be removed when the procedure is over. The procedure may vary among health care providers and hospitals. What happens after the procedure?  Unless your health care provider tells you otherwise, you may return to your normal schedule, including diet, activities, and medicines.  Unless your health care provider tells you otherwise, you may increase your fluid intake. This will help to flush the contrast dye from your body. Drink enough fluid to keep your urine pale yellow.  Ask your health care provider, or the department that is doing the test: ? When will my results be ready? ? How will I get my results? Summary  A cardiac nuclear scan measures the blood flow to the heart when a person is resting and when he or she is exercising.  Tell your health care provider if you are pregnant.  Before the procedure, ask your health care provider about changing or stopping your regular medicines. This is especially important if you are taking diabetes medicines or blood thinners.  After the procedure, unless your health care provider tells you otherwise, increase your fluid intake. This will help flush the contrast dye from your body.  After the procedure, unless your health care provider tells you otherwise, you may return to your normal schedule, including diet, activities, and medicines. This information is  not intended to replace advice given to you by your health care provider. Make sure you discuss any questions you have with your health care provider. Document Released: 04/15/2004 Document Revised: 09/04/2017 Document Reviewed: 09/04/2017 Elsevier Interactive Patient Education  2019 [employer].

## 2018-05-07 ENCOUNTER — Other Ambulatory Visit (HOSPITAL_COMMUNITY): Payer: Self-pay | Admitting: Nephrology

## 2018-05-07 DIAGNOSIS — E1322 Other specified diabetes mellitus with diabetic chronic kidney disease: Secondary | ICD-10-CM

## 2018-05-07 DIAGNOSIS — N2581 Secondary hyperparathyroidism of renal origin: Secondary | ICD-10-CM

## 2018-05-07 DIAGNOSIS — N189 Chronic kidney disease, unspecified: Secondary | ICD-10-CM

## 2018-05-07 DIAGNOSIS — N183 Chronic kidney disease, stage 3 unspecified: Secondary | ICD-10-CM

## 2018-05-07 DIAGNOSIS — E785 Hyperlipidemia, unspecified: Secondary | ICD-10-CM

## 2018-05-07 DIAGNOSIS — E13319 Other specified diabetes mellitus with unspecified diabetic retinopathy without macular edema: Secondary | ICD-10-CM

## 2018-05-07 DIAGNOSIS — D631 Anemia in chronic kidney disease: Secondary | ICD-10-CM

## 2018-05-07 DIAGNOSIS — I129 Hypertensive chronic kidney disease with stage 1 through stage 4 chronic kidney disease, or unspecified chronic kidney disease: Secondary | ICD-10-CM

## 2018-05-09 NOTE — Progress Notes (Signed)
[facility] Note  05/11/2018     CHIEF COMPLAINT Patient presents for Retina Follow Up   HISTORY OF PRESENT ILLNESS: Philip Wilson is a 48 y.o. male who presents to the clinic today for:   HPI    Retina Follow Up    Patient presents with  Diabetic Retinopathy.  In both eyes.  Severity is moderate.  Duration of 5 weeks.  Since onset it is stable.  I, the attending physician,  performed the HPI with the patient and updated documentation appropriately.          Comments    Patient states vision the same OU. Diagnosed with stage 3 kidney disease on 01.31.20- will talk to doctor next week about dialysis, possible transplant. BS was 72 this am. Last a1c was 5.8 in December 2019.       Last edited by Bernarda Caffey, MD on 05/13/2018 10:48 PM. (History)    pt states he was dx with stage 3 renal disease 2 weeks ago and goes back to the dr on Monday to discuss dialysis or kidney transplant, pts wife states he is having a kidney biopsy on the 25th, pts wife states that he has been rx lisinopril in addition to the other bp meds he was taking   Referring physician: Lillard Anes, MD [address] [place], [place] [address]  HISTORICAL INFORMATION:   Selected notes from the MEDICAL RECORD NUMBER Referred by Dr. Ricki Bigley for diabetic retinopathy OU LEE: 05.28.19 (A. Hager) [BCVA: OD: 20/40 OS: 20/50+2] Ocular Hx-Diabetic Retinopathy PMH-DM    CURRENT MEDICATIONS: No current outpatient medications on file. (Ophthalmic Drugs)   Current Facility-Administered Medications (Ophthalmic Drugs)  Medication Route  . aflibercept (EYLEA) SOLN 2 mg Intravitreal  . aflibercept (EYLEA) SOLN 2 mg Intravitreal  . aflibercept (EYLEA) SOLN 2 mg Intravitreal  . aflibercept (EYLEA) SOLN 2 mg Intravitreal  . aflibercept (EYLEA) SOLN 2 mg Intravitreal  . aflibercept (EYLEA) SOLN 2 mg Intravitreal  . aflibercept (EYLEA) SOLN 2 mg Intravitreal  .  aflibercept (EYLEA) SOLN 2 mg Intravitreal  . aflibercept (EYLEA) SOLN 2 mg Intravitreal   Current Outpatient Medications (Other)  Medication Sig  . ACCU-CHEK GUIDE test strip every morning. Use to test fasting blood glucose  . cloNIDine (CATAPRES) 0.1 MG tablet Take 1 tablet by mouth 2 (two) times daily.  . furosemide (LASIX) 40 MG tablet Take 40 mg by mouth daily.  Marland Kitchen glimepiride (AMARYL) 4 MG tablet Take by mouth.  . labetalol (NORMODYNE) 200 MG tablet Take 200 mg by mouth 2 (two) times daily.   Marland Kitchen lisinopril (PRINIVIL,ZESTRIL) 20 MG tablet Take 20 mg by mouth daily.  . metFORMIN (GLUCOPHAGE) 1000 MG tablet Take 1,000 mg by mouth 2 (two) times daily with a meal.   . pravastatin (PRAVACHOL) 10 MG tablet Take 10 mg by mouth daily.  Marland Kitchen atorvastatin (LIPITOR) 40 MG tablet Take 40 mg by mouth daily at 6 PM.   . lisinopril-hydrochlorothiazide (PRINZIDE,ZESTORETIC) 20-12.5 MG tablet Take 1 tablet by mouth daily.    Current Facility-Administered Medications (Other)  Medication Route  . Bevacizumab (AVASTIN) SOLN 1.25 mg Intravitreal  . Bevacizumab (AVASTIN) SOLN 1.25 mg Intravitreal  . Bevacizumab (AVASTIN) SOLN 1.25 mg Intravitreal  . Bevacizumab (AVASTIN) SOLN 1.25 mg Intravitreal  . Bevacizumab (AVASTIN) SOLN 1.25 mg Intravitreal  . Bevacizumab (AVASTIN) SOLN 1.25 mg Intravitreal  . Bevacizumab (AVASTIN) SOLN 1.25 mg Intravitreal      REVIEW OF SYSTEMS: ROS  Positive for: Endocrine, Cardiovascular, Eyes   Negative for: Constitutional, Gastrointestinal, Neurological, Skin, Genitourinary, Musculoskeletal, HENT, Respiratory, Psychiatric, Allergic/Imm, Heme/Lymph   Last edited by Roselee Nova D on 05/11/2018  1:53 PM. (History)       ALLERGIES No Known Allergies  PAST MEDICAL HISTORY Past Medical History:  Diagnosis Date  . Chronic kidney disease 05/04/2018  . Diabetes 1.5, managed as type 2 ([place])   . Hypertension    History reviewed. No pertinent surgical history.  FAMILY  HISTORY Family History  Problem Relation Age of Onset  . Heart failure Mother     SOCIAL HISTORY Social History   Tobacco Use  . Smoking status: Former Research scientist (life sciences)  . Smokeless tobacco: Never Used  Substance Use Topics  . Alcohol use: Yes    Alcohol/week: 1.0 standard drinks    Types: 1 Cans of beer per week  . Drug use: Never         OPHTHALMIC EXAM:  Base Eye Exam    Visual Acuity (Snellen - Linear)      Right Left   Dist [place] 20/30 -1 20/25 -2   Dist ph [place] 20/25 +1 20/25 -1       Tonometry (Tonopen, 2:05 PM)      Right Left   Pressure 18 19       Pupils      Dark Light Shape React APD   Right 3 2 Round Brisk None   Left 3 2 Round Brisk None       Visual Fields (Counting fingers)      Left Right    Full Full       Extraocular Movement      Right Left    Full, Ortho Full, Ortho       Neuro/Psych    Oriented x3:  Yes   Mood/Affect:  Normal       Dilation    Both eyes:  1.0% Mydriacyl, 2.5% Phenylephrine @ 2:05 PM        Slit Lamp and Fundus Exam    Slit Lamp Exam      Right Left   Lids/Lashes Dermatochalasis - upper lid, mild Meibomian gland dysfunction Dermatochalasis - upper lid, mild Meibomian gland dysfunction   Conjunctiva/Sclera White and quiet White and quiet   Cornea Clear Clear   Anterior Chamber Deep and quiet, no cell or flare Deep and quiet, no cell or flare   Iris Round and dilated to 33mm Round and dilated to 34mm   Lens 1+ Nuclear sclerosis, 2+ Cortical cataract 1+ Nuclear sclerosis, 2+ Cortical cataract   Vitreous Mild Vitreous syneresis Mild Vitreous syneresis, old white VH inferiorly,       Fundus Exam      Right Left   Disc Pink and Sharp Pink and Sharp, hyperemia   C/D Ratio 0.2 0.2   Macula Blunted foveal reflex, persistent central edema, with improved exudates, DBH/Microaneurysms - improved, small CWS superior macula Blunted foveal reflex, persistent central edema with significant exudates greatest temporal to fovea, DBH/MA's  improved, few CWS superior macula   Vessels Tortuous, AV crossing changes, Vascular attenuation Tortuous, AV crossing changes   Periphery Attached, scattered MAs and DBH - mostly posterior, scattered CWS -- all improved Attached, scattered MAs and DBH -- mostly posterior -- all improved          IMAGING AND PROCEDURES  Imaging and Procedures for @TODAY @  OCT, Retina - OU - Both Eyes       Right Eye Quality  was good. Central Foveal Thickness: 316. Progression has improved. Findings include abnormal foveal contour, intraretinal fluid, outer retinal atrophy, no SRF, retinal drusen , intraretinal hyper-reflective material (Mild Interval improvement in foveal contour and IRF ).   Left Eye Quality was good. Central Foveal Thickness: 313. Progression has worsened. Findings include intraretinal fluid, epiretinal membrane, no SRF, normal foveal contour, intraretinal hyper-reflective material (Mild interval increase in ST IRF).   Notes *Images captured and stored on drive  Diagnosis / Impression:  DME OU OD: Interval improvement in foveal contour and IRF  OS: interval increase in IRF/IRHM  Clinical management:  See below  Abbreviations: NFP - Normal foveal profile. CME - cystoid macular edema. PED - pigment epithelial detachment. IRF - intraretinal fluid. SRF - subretinal fluid. EZ - ellipsoid zone. ERM - epiretinal membrane. ORA - outer retinal atrophy. ORT - outer retinal tubulation. SRHM - subretinal hyper-reflective material        Intravitreal Injection, Pharmacologic Agent - OD - Right Eye       Time Out 05/11/2018. 3:11 PM. Confirmed correct patient, procedure, site, and patient consented.   Anesthesia Topical anesthesia was used. Anesthetic medications included Lidocaine 2%, Proparacaine 0.5%.   Procedure Preparation included 5% betadine to ocular surface, eyelid speculum. A 30 gauge needle was used.   Injection:  2 mg aflibercept Alfonse Flavors) SOLN   NDC: M7179715, Lot:  [id number], Expiration date: 04/03/2019   Route: Intravitreal, Site: Right Eye, Waste: 0.05 mL  Post-op Post injection exam found visual acuity of at least counting fingers. The patient tolerated the procedure well. There were no complications. The patient received written and verbal post procedure care education.        Intravitreal Injection, Pharmacologic Agent - OS - Left Eye       Time Out 05/11/2018. 3:11 PM. Confirmed correct patient, procedure, site, and patient consented.   Anesthesia Topical anesthesia was used. Anesthetic medications included Lidocaine 2%, Proparacaine 0.5%.   Procedure Preparation included 5% betadine to ocular surface, eyelid speculum. A 30 gauge needle was used.   Injection:  2 mg aflibercept Alfonse Flavors) SOLN   NDC: M7179715, Lot: [id number], Expiration date: 04/03/2019   Route: Intravitreal, Site: Left Eye, Waste: 0.05 mL  Post-op Post injection exam found visual acuity of at least counting fingers. The patient tolerated the procedure well. There were no complications. The patient received written and verbal post procedure care education.                 ASSESSMENT/PLAN:    ICD-10-CM   1. 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 Retinal edema H35.81 OCT, Retina - OU - Both Eyes  3. Essential hypertension I10   4. Hypertensive retinopathy of both eyes H35.033   5. 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 Severe Non-proliferative diabetic retinopathy, both eyes - initial exam showed massive central DME OU and scattered IRH and IRMA - FA 6.5.19 with patches of capillary nonperfusion; extensive Mas with late leakage OU; no frank NV - S/P IVA OS #1 (06.05.19), #2 (07.08.19), #3 (07.08.19), #4  (09.03.19) - S/P IVA OD #1 (06.07.19), #2 (07.08.19), #3 (07.08.19) - S/P IVE OD #1 (09.03.19), #2 (10.03.19), #3 (11.04.19), #4 (12.02.19), #5 (01.02.20) - S/P IVE OS #1 (10.03.19), #2 (11.04.19), #3 (  12.02.19), #4 (01.02.20) - FA 01.02.20, shows improvement in late leaking microaneurysms OU - OCT shows persistent / tr increase in IRF OU  - BCVA down from 20/20 to 20/25 OU - discussed findings and OCT - recommend IVE OU today (02.07.20) -- OD #6, OS #5 - pt wishes to proceed - RBA of procedure discussed, questions answered - informed consent obtained and signed - see procedure note -- tolerated well - Eylea paperwork and benefits investigation started on 08.05.19 -- approved w/ Commercial assistance program -- approved for 2020 - f/u in 4 weeks  3,4. Hypertensive retinopathy OU - discussed importance of tight BP control - has had some Bps in hypertensive emergency range (200s / 110s) - discussed likely contribution to DME - pt reports new diagnosis of renal insufficiency which is complicating his HTN - BP measured in office today -- 162/92 - monitor - BP management per nephrology, PCP and cardiology  5. 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 - discussed diagnosis and progression - not yet visually significant - monitor for now   Ophthalmic Meds Ordered this visit:  No orders of the defined types were placed in this encounter.      Return in about 5 weeks (around 06/15/2018) for f/u NPDR OU, DFE, OCT.  There are no Patient Instructions on file for this visit.   Explained the diagnoses, plan, and follow up with the patient and they expressed understanding.  Patient expressed understanding of the importance of proper follow up care.   This document serves as a record of services personally performed by Gardiner Sleeper, MD, PhD. It was created on their behalf by Ernest Mallick,  OA, an ophthalmic assistant. The creation of this record is the provider's dictation and/or activities during the visit.    Electronically signed by: Ernest Mallick, OA  02.05.2020 10:48 PM    Gardiner Sleeper, M.D., Ph.D. Diseases & Surgery of the Retina and Vitreous [facility]  I have reviewed the above documentation for accuracy and completeness, and I agree with the above. Gardiner Sleeper, M.D., Ph.D. 05/13/18 10:53 PM    Abbreviations: M myopia (nearsighted); A astigmatism; H hyperopia (farsighted); P presbyopia; Mrx spectacle prescription;  CTL contact lenses; OD right eye; OS left eye; OU both eyes  XT exotropia; ET esotropia; PEK punctate epithelial keratitis; PEE punctate epithelial erosions; DES dry eye syndrome; MGD meibomian gland dysfunction; ATs artificial tears; PFAT's preservative free artificial tears; [place] nuclear sclerotic cataract; PSC posterior subcapsular cataract; ERM epi-retinal membrane; PVD posterior vitreous detachment; RD retinal detachment; DM diabetes mellitus; DR diabetic retinopathy; NPDR non-proliferative diabetic retinopathy; PDR proliferative diabetic retinopathy; CSME clinically significant macular edema; DME diabetic macular edema; dbh dot blot hemorrhages; CWS cotton wool spot; POAG primary open angle glaucoma; C/D cup-to-disc ratio; HVF humphrey visual field; GVF goldmann visual field; OCT optical coherence tomography; IOP intraocular pressure; BRVO Branch retinal vein occlusion; CRVO central retinal vein occlusion; CRAO central retinal artery occlusion; BRAO branch retinal artery occlusion; RT retinal tear; SB scleral buckle; PPV pars plana vitrectomy; VH Vitreous hemorrhage; PRP panretinal laser photocoagulation; IVK intravitreal kenalog; VMT vitreomacular traction; MH Macular hole;  NVD neovascularization of the disc; NVE neovascularization elsewhere; AREDS age related eye disease study; ARMD age related macular degeneration; POAG primary  open angle glaucoma; EBMD epithelial/anterior basement membrane dystrophy; ACIOL anterior chamber intraocular lens; IOL intraocular lens; PCIOL posterior chamber intraocular lens; Phaco/IOL phacoemulsification  with intraocular lens placement; [place] photorefractive keratectomy; LASIK laser assisted in situ keratomileusis; HTN hypertension; DM diabetes mellitus; COPD chronic obstructive pulmonary disease

## 2018-05-11 ENCOUNTER — Ambulatory Visit (INDEPENDENT_AMBULATORY_CARE_PROVIDER_SITE_OTHER): Payer: BC Managed Care – PPO | Admitting: Ophthalmology

## 2018-05-11 ENCOUNTER — Encounter (INDEPENDENT_AMBULATORY_CARE_PROVIDER_SITE_OTHER): Payer: Self-pay | Admitting: Ophthalmology

## 2018-05-11 VITALS — BP 162/92 | HR 88

## 2018-05-11 DIAGNOSIS — H35033 Hypertensive retinopathy, bilateral: Secondary | ICD-10-CM | POA: Diagnosis not present

## 2018-05-11 DIAGNOSIS — H3581 Retinal edema: Secondary | ICD-10-CM | POA: Diagnosis not present

## 2018-05-11 DIAGNOSIS — E113413 Type 2 diabetes mellitus with severe nonproliferative diabetic retinopathy with macular edema, bilateral: Secondary | ICD-10-CM

## 2018-05-11 DIAGNOSIS — I10 Essential (primary) hypertension: Secondary | ICD-10-CM | POA: Diagnosis not present

## 2018-05-11 DIAGNOSIS — H25813 Combined forms of age-related cataract, bilateral: Secondary | ICD-10-CM

## 2018-05-13 ENCOUNTER — Encounter (INDEPENDENT_AMBULATORY_CARE_PROVIDER_SITE_OTHER): Payer: Self-pay | Admitting: Ophthalmology

## 2018-05-13 MED ORDER — AFLIBERCEPT 2MG/0.05ML IZ SOLN FOR KALEIDOSCOPE
2.0000 mg | INTRAVITREAL | Status: DC
  Administered 2018-05-13: 2 mg via INTRAVITREAL

## 2018-05-26 ENCOUNTER — Other Ambulatory Visit: Payer: Self-pay | Admitting: Radiology

## 2018-05-28 ENCOUNTER — Other Ambulatory Visit: Payer: Self-pay | Admitting: Physician Assistant

## 2018-05-29 ENCOUNTER — Encounter (HOSPITAL_COMMUNITY): Payer: Self-pay

## 2018-05-29 ENCOUNTER — Ambulatory Visit (HOSPITAL_COMMUNITY)
Admission: RE | Admit: 2018-05-29 | Discharge: 2018-05-29 | Disposition: A | Discharge status: Home / Self Care | Payer: BC Managed Care – PPO | Source: Ambulatory Visit | Attending: Nephrology | Admitting: Nephrology

## 2018-05-29 DIAGNOSIS — E1321 Other specified diabetes mellitus with diabetic nephropathy: Secondary | ICD-10-CM | POA: Insufficient documentation

## 2018-05-29 DIAGNOSIS — Z87891 Personal history of nicotine dependence: Secondary | ICD-10-CM | POA: Insufficient documentation

## 2018-05-29 DIAGNOSIS — N183 Chronic kidney disease, stage 3 unspecified: Secondary | ICD-10-CM

## 2018-05-29 DIAGNOSIS — D631 Anemia in chronic kidney disease: Secondary | ICD-10-CM | POA: Insufficient documentation

## 2018-05-29 DIAGNOSIS — Z7984 Long term (current) use of oral hypoglycemic drugs: Secondary | ICD-10-CM | POA: Diagnosis not present

## 2018-05-29 DIAGNOSIS — E13319 Other specified diabetes mellitus with unspecified diabetic retinopathy without macular edema: Secondary | ICD-10-CM | POA: Diagnosis not present

## 2018-05-29 DIAGNOSIS — I129 Hypertensive chronic kidney disease with stage 1 through stage 4 chronic kidney disease, or unspecified chronic kidney disease: Secondary | ICD-10-CM | POA: Diagnosis not present

## 2018-05-29 DIAGNOSIS — Z79899 Other long term (current) drug therapy: Secondary | ICD-10-CM | POA: Diagnosis not present

## 2018-05-29 DIAGNOSIS — E785 Hyperlipidemia, unspecified: Secondary | ICD-10-CM | POA: Diagnosis not present

## 2018-05-29 DIAGNOSIS — E1322 Other specified diabetes mellitus with diabetic chronic kidney disease: Secondary | ICD-10-CM

## 2018-05-29 DIAGNOSIS — N2581 Secondary hyperparathyroidism of renal origin: Secondary | ICD-10-CM | POA: Diagnosis not present

## 2018-05-29 DIAGNOSIS — N189 Chronic kidney disease, unspecified: Secondary | ICD-10-CM

## 2018-05-29 LAB — PROTIME-INR
INR: 1 (ref 0.8–1.2)
Prothrombin Time: 13.3 seconds (ref 11.4–15.2)

## 2018-05-29 LAB — CBC
HCT: 32.7 % — ABNORMAL LOW (ref 39.0–52.0)
Hemoglobin: 10.5 g/dL — ABNORMAL LOW (ref 13.0–17.0)
MCH: 28.2 pg (ref 26.0–34.0)
MCHC: 32.1 g/dL (ref 30.0–36.0)
MCV: 87.9 fL (ref 80.0–100.0)
NRBC: 0 % (ref 0.0–0.2)
Platelets: 262 10*3/uL (ref 150–400)
RBC: 3.72 MIL/uL — AB (ref 4.22–5.81)
RDW: 11.9 % (ref 11.5–15.5)
WBC: 8.6 10*3/uL (ref 4.0–10.5)

## 2018-05-29 LAB — GLUCOSE, CAPILLARY
GLUCOSE-CAPILLARY: 62 mg/dL — AB (ref 70–99)
Glucose-Capillary: 88 mg/dL (ref 70–99)
Glucose-Capillary: 112 mg/dL — ABNORMAL HIGH (ref 70–99)

## 2018-05-29 MED ORDER — MIDAZOLAM HCL 2 MG/2ML IJ SOLN
INTRAMUSCULAR | Status: AC
  Filled 2018-05-29: qty 2

## 2018-05-29 MED ORDER — HYDROCODONE-ACETAMINOPHEN 5-325 MG PO TABS: 1.0000 | ORAL_TABLET | ORAL | Status: DC | PRN

## 2018-05-29 MED ORDER — SODIUM CHLORIDE 0.9 % IV SOLN: INTRAVENOUS | Status: DC

## 2018-05-29 MED ORDER — SODIUM CHLORIDE 0.9 % IV SOLN
INTRAVENOUS | Status: AC | PRN
  Administered 2018-05-29: 10 mL/h via INTRAVENOUS

## 2018-05-29 MED ORDER — FENTANYL CITRATE (PF) 100 MCG/2ML IJ SOLN
INTRAMUSCULAR | Status: AC | PRN
  Administered 2018-05-29 (×2): 50 ug via INTRAVENOUS

## 2018-05-29 MED ORDER — LIDOCAINE HCL (PF) 1 % IJ SOLN
INTRAMUSCULAR | Status: AC
  Filled 2018-05-29: qty 30

## 2018-05-29 MED ORDER — HYDRALAZINE HCL 20 MG/ML IJ SOLN
INTRAMUSCULAR | Status: AC | PRN
  Administered 2018-05-29: 10 mg via INTRAVENOUS

## 2018-05-29 MED ORDER — FENTANYL CITRATE (PF) 100 MCG/2ML IJ SOLN
INTRAMUSCULAR | Status: AC
  Filled 2018-05-29: qty 2

## 2018-05-29 MED ORDER — MIDAZOLAM HCL 2 MG/2ML IJ SOLN
INTRAMUSCULAR | Status: AC | PRN
  Administered 2018-05-29 (×2): 1 mg via INTRAVENOUS

## 2018-05-29 MED ORDER — HYDRALAZINE HCL 20 MG/ML IJ SOLN
INTRAMUSCULAR | Status: AC
  Filled 2018-05-29: qty 1

## 2018-05-29 NOTE — H&P (Addendum)
Chief Complaint: Patient was seen in consultation today for random renal biopsy at the request of Philip Wilson  Referring Physician(s): Philip Wilson  Supervising Physician: Philip Wilson  Patient Status: [facility] OP  History of Present Illness: Philip Wilson is a 48 y.o. male   Hx HTN; DM Was determined CKD; rising creatinine- just Dec 2019 Was referred to Dr Philip Wilson Now scheduled for random renal biopsy   Past Medical History:  Diagnosis Date  . Chronic kidney disease 05/04/2018  . Diabetes 1.5, managed as type 2 ([place])   . Hypertension     History reviewed. No pertinent surgical history.  Allergies: Patient has no known allergies.  Medications: Prior to Admission medications   Medication Sig Start Date End Date Taking? Authorizing Provider  atorvastatin (LIPITOR) 40 MG tablet Take 40 mg by mouth daily at 6 PM.  11/04/17  Yes [provider]  cloNIDine (CATAPRES) 0.1 MG tablet Take 0.1 mg by mouth 2 (two) times daily.  04/09/18  Yes [provider]  furosemide (LASIX) 40 MG tablet Take 40 mg by mouth daily.   Yes [provider]  glimepiride (AMARYL) 4 MG tablet Take 4 mg by mouth daily with breakfast.    Yes [provider]  labetalol (NORMODYNE) 200 MG tablet Take 200 mg by mouth 2 (two) times daily.    Yes [provider]  lisinopril (PRINIVIL,ZESTRIL) 20 MG tablet Take 20 mg by mouth daily. 05/04/18  Yes [provider]  metFORMIN (GLUCOPHAGE) 1000 MG tablet Take 1,000 mg by mouth 2 (two) times daily with a meal.  08/07/17  Yes [provider]  ACCU-CHEK GUIDE test strip every morning. Use to test fasting blood glucose 11/21/17   [provider]     Family History  Problem Relation Age of Onset  . Heart failure Mother     Social History   Socioeconomic History  . Marital status: Married    Spouse name: Not on file  . Number of children: Not on file  . Years of education: Not on file  .  Highest education level: Not on file  Occupational History  . Occupation: Geographical information systems officer  . Financial resource strain: Not on file  . Food insecurity:    Worry: Not on file    Inability: Not on file  . Transportation needs:    Medical: Not on file    Non-medical: Not on file  Tobacco Use  . Smoking status: Former Research scientist (life sciences)  . Smokeless tobacco: Never Used  Substance and Sexual Activity  . Alcohol use: Yes    Alcohol/week: 1.0 standard drinks    Types: 1 Cans of beer per week  . Drug use: Never  . Sexual activity: Not on file  Lifestyle  . Physical activity:    Days per week: Not on file    Minutes per session: Not on file  . Stress: Not on file  Relationships  . Social connections:    Talks on phone: Not on file    Gets together: Not on file    Attends religious service: Not on file    Active member of club or organization: Not on file    Attends meetings of clubs or organizations: Not on file    Relationship status: Not on file  Other Topics Concern  . Not on file  Social History Narrative  . Not on file    Review of Systems: A 12 point ROS discussed and pertinent positives are indicated in the HPI  above.  All other systems are negative.  Review of Systems  Constitutional: Negative for activity change, appetite change, fatigue and fever.  Respiratory: Negative for cough and shortness of breath.   Cardiovascular: Negative for chest pain.  Gastrointestinal: Negative for abdominal pain.  Musculoskeletal: Negative for back pain and gait problem.  Neurological: Negative for weakness.  Psychiatric/Behavioral: Negative for behavioral problems and confusion.    Vital Signs: BP (!) 182/94   Pulse 80   Temp 98.1 F (36.7 C) (Oral)   Resp 16   Ht 5' 10.5" (1.791 m)   Wt 198 lb (89.8 kg)   SpO2 98%   BMI 28.01 kg/m   Physical Exam Vitals signs reviewed.  Cardiovascular:     Rate and Rhythm: Normal rate and regular rhythm.     Heart sounds: Normal heart  sounds.  Pulmonary:     Effort: Pulmonary effort is normal.     Breath sounds: Normal breath sounds.  Abdominal:     General: Bowel sounds are normal.     Palpations: Abdomen is soft.  Musculoskeletal: Normal range of motion.  Skin:    General: Skin is warm and dry.  Neurological:     Mental Status: He is alert and oriented to person, place, and time.  Psychiatric:        Mood and Affect: Mood normal.        Behavior: Behavior normal.        Thought Content: Thought content normal.        Judgment: Judgment normal.     Imaging: No results found.  Labs:  CBC: Recent Labs    05/29/18 0546  WBC 8.6  HGB 10.5*  HCT 32.7*  PLT 262    COAGS: Recent Labs    05/29/18 0546  INR 1.0    BMP: No results for input(s): NA, K, CL, CO2, GLUCOSE, BUN, CALCIUM, CREATININE, GFRNONAA, GFRAA in the last 8760 hours.  Invalid input(s): CMP  LIVER FUNCTION TESTS: No results for input(s): BILITOT, AST, ALT, ALKPHOS, PROT, ALBUMIN in the last 8760 hours.  TUMOR MARKERS: No results for input(s): AFPTM, CEA, CA199, CHROMGRNA in the last 8760 hours.  Assessment and Plan:  Hx DM Hx HTN: 182/94 today Has now taken homed BP meds (will recheck BP) CKD III per Dr Philip Wilson notes; rising Creatinine; proteinuria Now scheduled for random renal biopsy Risks and benefits of random renal biopsy was discussed with the patient and/or patient's family including, but not limited to bleeding, infection, damage to adjacent structures or low yield requiring additional tests.  All of the questions were answered and there is agreement to proceed. Consent signed and in chart.  Thank you for this interesting consult.  I greatly enjoyed meeting [name] and look forward to participating in their care.  A copy of this report was sent to the requesting provider on this date.  Electronically Signed: Lavonia Drafts, PA-C 05/29/2018, 6:53 AM   I spent a total of  30 Minutes   in face to face in  clinical consultation, greater than 50% of which was counseling/coordinating care for random renal biopsy

## 2018-05-29 NOTE — Sedation Documentation (Signed)
BP more elevated 173 97. Will let Dr Kathlene Cote know.

## 2018-05-29 NOTE — Sedation Documentation (Signed)
Patient denies pain and is resting comfortably.  

## 2018-05-29 NOTE — Procedures (Signed)
Interventional Radiology Procedure Note  Procedure: US Guided Biopsy of Right Kidney  Complications: None  Estimated Blood Loss: < 10 mL  Findings: 25 G core biopsy of right kidney performed under ultrasound guidance.  Two core samples obtained and sent to Pathology.  Venetia Night. Kathlene Cote, M.D Pager:  [phone]

## 2018-05-29 NOTE — Sedation Documentation (Signed)
Patient is resting comfortably. 

## 2018-05-29 NOTE — Discharge Instructions (Signed)
Percutaneous Kidney Biopsy, Care After °This sheet gives you information about how to care for yourself after your procedure. Your health care provider may also give you more specific instructions. If you have problems or questions, contact your health care provider. °What can I expect after the procedure? °After the procedure, it is common to have: °· Pain or soreness near the area where the needle went through your skin (biopsy site). °· Bright pink or cloudy urine for 24 hours after the procedure. °Follow these instructions at home: °Activity °· Return to your normal activities as told by your health care provider. Ask your health care provider what activities are safe for you. °· Do not drive for 24 hours if you were given a medicine to help you relax (sedative). °· Do not lift anything that is heavier than 10 lb (4.5 kg) until your health care provider tells you that it is safe. °· Avoid activities that take a lot of effort (are strenuous) until your health care provider approves. Most people will have to wait 2 weeks before returning to activities such as exercise or sexual intercourse. °General instructions ° °· Take over-the-counter and prescription medicines only as told by your health care provider. °· You may eat and drink after your procedure. Follow instructions from your health care provider about eating or drinking restrictions. °· Check your biopsy site every day for signs of infection. Check for: °? More redness, swelling, or pain. °? More fluid or blood. °? Warmth. °? Pus or a bad smell. °· Keep all follow-up visits as told by your health care provider. This is important. °Contact a health care provider if: °· You have more redness, swelling, or pain around your biopsy site. °· You have more fluid or blood coming from your biopsy site. °· Your biopsy site feels warm to the touch. °· You have pus or a bad smell coming from your biopsy site. °· You have blood in your urine more than 24 hours after  your procedure. °Get help right away if: °· You have dark red or brown urine. °· You have a fever. °· You are unable to urinate. °· You feel burning when you urinate. °· You feel faint. °· You have severe pain in your abdomen or side. °This information is not intended to replace advice given to you by your health care provider. Make sure you discuss any questions you have with your health care provider. °Document Released: 11/21/2012 Document Revised: 01/01/2016 Document Reviewed: 01/01/2016 °Elsevier Interactive Patient Education © 2019 [employer] °Moderate Conscious Sedation, Adult, Care After °These instructions provide you with information about caring for yourself after your procedure. Your health care provider may also give you more specific instructions. Your treatment has been planned according to current medical practices, but problems sometimes occur. Call your health care provider if you have any problems or questions after your procedure. °What can I expect after the procedure? °After your procedure, it is common: °· To feel sleepy for several hours. °· To feel clumsy and have poor balance for several hours. °· To have poor judgment for several hours. °· To vomit if you eat too soon. °Follow these instructions at home: °For at least 24 hours after the procedure: ° °· Do not: °? Participate in activities where you could fall or become injured. °? Drive. °? Use heavy machinery. °? Drink alcohol. °? Take sleeping pills or medicines that cause drowsiness. °? Make important decisions or sign legal documents. °? Take care of children on   your own. °· Rest. °Eating and drinking °· Follow the diet recommended by your health care provider. °· If you vomit: °? Drink water, juice, or soup when you can drink without vomiting. °? Make sure you have little or no nausea before eating solid foods. °General instructions °· Have a responsible adult stay with you until you are awake and alert. °· Take over-the-counter  and prescription medicines only as told by your health care provider. °· If you smoke, do not smoke without supervision. °· Keep all follow-up visits as told by your health care provider. This is important. °Contact a health care provider if: °· You keep feeling nauseous or you keep vomiting. °· You feel light-headed. °· You develop a rash. °· You have a fever. °Get help right away if: °· You have trouble breathing. °This information is not intended to replace advice given to you by your health care provider. Make sure you discuss any questions you have with your health care provider. °Document Released: 01/09/2013 Document Revised: 08/24/2015 Document Reviewed: 07/11/2015 °Elsevier Interactive Patient Education © 2019 [employer] ° °

## 2018-05-31 ENCOUNTER — Telehealth (HOSPITAL_COMMUNITY): Payer: Self-pay | Admitting: *Deleted

## 2018-05-31 ENCOUNTER — Encounter (HOSPITAL_COMMUNITY): Payer: Self-pay | Admitting: *Deleted

## 2018-05-31 NOTE — Telephone Encounter (Signed)
Patient given detailed instructions per Myocardial Perfusion Study Information Sheet for the test on 06/05/18. Patient notified to arrive 15 minutes early and that it is imperative to arrive on time for appointment to keep from having the test rescheduled.  If you need to cancel or reschedule your appointment, please call the office within 24 hours of your appointment. . Patient verbalized understanding. [name]

## 2018-06-05 ENCOUNTER — Ambulatory Visit (INDEPENDENT_AMBULATORY_CARE_PROVIDER_SITE_OTHER): Payer: BC Managed Care – PPO

## 2018-06-05 VITALS — Ht 69.0 in | Wt 207.0 lb

## 2018-06-05 DIAGNOSIS — R0609 Other forms of dyspnea: Secondary | ICD-10-CM

## 2018-06-05 DIAGNOSIS — E088 Diabetes mellitus due to underlying condition with unspecified complications: Secondary | ICD-10-CM | POA: Diagnosis not present

## 2018-06-05 LAB — MYOCARDIAL PERFUSION IMAGING
LV dias vol: 220 mL (ref 62–150)
LVSYSVOL: 147 mL
Peak HR: 100 {beats}/min
Rest HR: 67 {beats}/min
SDS: 1
SRS: 1
SSS: 2
TID: 1.06

## 2018-06-05 MED ORDER — REGADENOSON 0.4 MG/5ML IV SOLN
0.4000 mg | Freq: Once | INTRAVENOUS | Status: AC
  Administered 2018-06-05: 0.4 mg via INTRAVENOUS

## 2018-06-05 MED ORDER — TECHNETIUM TC 99M TETROFOSMIN IV KIT
10.6000 | PACK | Freq: Once | INTRAVENOUS | Status: AC | PRN
  Administered 2018-06-05: 10.6 via INTRAVENOUS

## 2018-06-05 MED ORDER — TECHNETIUM TC 99M TETROFOSMIN IV KIT
32.5000 | PACK | Freq: Once | INTRAVENOUS | Status: AC | PRN
  Administered 2018-06-05: 32.5 via INTRAVENOUS

## 2018-06-06 ENCOUNTER — Ambulatory Visit (INDEPENDENT_AMBULATORY_CARE_PROVIDER_SITE_OTHER): Payer: BC Managed Care – PPO

## 2018-06-06 ENCOUNTER — Telehealth: Payer: Self-pay

## 2018-06-06 DIAGNOSIS — R0609 Other forms of dyspnea: Secondary | ICD-10-CM

## 2018-06-06 DIAGNOSIS — I10 Essential (primary) hypertension: Secondary | ICD-10-CM | POA: Diagnosis not present

## 2018-06-06 NOTE — Progress Notes (Signed)
Complete echocardiogram has been performed.  Jimmy Courtnie Brenes RDCS, RVT 

## 2018-06-06 NOTE — Telephone Encounter (Signed)
-----   Message from Jenean Lindau, MD sent at 06/06/2018  1:58 PM EST ----- The results of the study is unremarkable. Please inform patient. I will discuss in detail at next appointment. Cc  primary care/referring physician Jenean Lindau, MD 06/06/2018 1:58 PM

## 2018-06-06 NOTE — Telephone Encounter (Signed)
Patient called and notified of test results. 

## 2018-06-07 ENCOUNTER — Other Ambulatory Visit: Payer: BC Managed Care – PPO

## 2018-06-15 ENCOUNTER — Encounter (INDEPENDENT_AMBULATORY_CARE_PROVIDER_SITE_OTHER): Payer: BC Managed Care – PPO | Admitting: Ophthalmology

## 2018-06-17 NOTE — Progress Notes (Signed)
[facility]  06/18/2018     CHIEF COMPLAINT Patient presents for Retina Evaluation   HISTORY OF PRESENT ILLNESS: [facility] Wilson is a 48 y.o. male who presents to the clinic today for:   HPI    Retina Evaluation    In both eyes.  This started months ago.  Duration of months.  Context:  distance vision and near vision.  I, the attending physician,  performed the HPI with the patient and updated documentation appropriately.          Comments    NPDR eval OU.  Patient states his vision is stable.  Denies eye pain and denies floaters or flashes of light OU       Last edited by Philip Caffey, MD on 06/20/2018 12:00 AM. (History)    pt states he had a biopsy on his kidney's and he is at 24%-28% kidney function in both kidney's, he states at 20% he will be put on a transplant list, he states they are in the process of being trained for in home dialysis   Referring physician: Lillard Anes, MD [address] [place], [place] [address]  HISTORICAL INFORMATION:   Selected notes from the [facility] Referred by Dr. Ricki Wilson for diabetic retinopathy OU LEE: 05.28.19 ([facility]) [BCVA: OD: 20/40 OS: 20/50+2] Ocular Hx-Diabetic Retinopathy PMH-DM    CURRENT MEDICATIONS: No current outpatient medications on file. (Ophthalmic Drugs)   Current Facility-Administered Medications (Ophthalmic Drugs)  Medication Route  . aflibercept (EYLEA) SOLN 2 mg Intravitreal  . aflibercept (EYLEA) SOLN 2 mg Intravitreal  . aflibercept (EYLEA) SOLN 2 mg Intravitreal  . aflibercept (EYLEA) SOLN 2 mg Intravitreal  . aflibercept (EYLEA) SOLN 2 mg Intravitreal  . aflibercept (EYLEA) SOLN 2 mg Intravitreal  . aflibercept (EYLEA) SOLN 2 mg Intravitreal  . aflibercept (EYLEA) SOLN 2 mg Intravitreal  . aflibercept (EYLEA) SOLN 2 mg Intravitreal  . aflibercept (EYLEA) SOLN 2 mg Intravitreal  . aflibercept (EYLEA) SOLN 2 mg Intravitreal    Current Outpatient Medications (Other)  Medication Sig  . ACCU-CHEK GUIDE test strip every morning. Use to test fasting blood glucose  . atorvastatin (LIPITOR) 40 MG tablet Take 40 mg by mouth daily at 6 PM.   . cloNIDine (CATAPRES) 0.1 MG tablet Take 0.1 mg by mouth 2 (two) times daily.   . furosemide (LASIX) 40 MG tablet Take 40 mg by mouth daily.  Marland Kitchen glimepiride (AMARYL) 4 MG tablet Take 4 mg by mouth daily with breakfast.   . labetalol (NORMODYNE) 200 MG tablet Take 200 mg by mouth 2 (two) times daily.   Marland Kitchen lisinopril (PRINIVIL,ZESTRIL) 20 MG tablet Take 20 mg by mouth daily.  . metFORMIN (GLUCOPHAGE) 1000 MG tablet Take 1,000 mg by mouth 2 (two) times daily with a meal.    Current Facility-Administered Medications (Other)  Medication Route  . Bevacizumab (AVASTIN) SOLN 1.25 mg Intravitreal  . Bevacizumab (AVASTIN) SOLN 1.25 mg Intravitreal  . Bevacizumab (AVASTIN) SOLN 1.25 mg Intravitreal  . Bevacizumab (AVASTIN) SOLN 1.25 mg Intravitreal  . Bevacizumab (AVASTIN) SOLN 1.25 mg Intravitreal  . Bevacizumab (AVASTIN) SOLN 1.25 mg Intravitreal  . Bevacizumab (AVASTIN) SOLN 1.25 mg Intravitreal      REVIEW OF SYSTEMS: ROS    Positive for: Endocrine, Cardiovascular, Eyes   Negative for: Constitutional, Gastrointestinal, Neurological, Skin, Genitourinary, Musculoskeletal, HENT, Respiratory, Psychiatric, Allergic/Imm, Heme/Lymph   Last edited by [facility] on 06/18/2018  9:31 AM. (History)  ALLERGIES No Known Allergies  PAST MEDICAL HISTORY Past Medical History:  Diagnosis Date  . Chronic kidney disease 05/04/2018  . 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 Hypertension    History reviewed. No pertinent surgical history.  FAMILY HISTORY Family History  Problem Relation Age of Onset  . Heart failure Mother     SOCIAL HISTORY Social History   Tobacco Use  . Smoking status: Former Research scientist (life sciences)  . Smokeless tobacco: Never Used  Substance Use Topics  .  Alcohol use: Yes    Alcohol/week: 1.0 standard drinks    Types: 1 Cans of beer per week  . Drug use: Never         OPHTHALMIC EXAM:  Base Eye Exam    Visual Acuity (Snellen - Linear)      Right Left   Dist [facility] 20/25 -2 20/25 -1   Dist ph [place] 20/NI 20/NI   Correction:  Glasses       Tonometry (Tonopen, 9:36 AM)      Right Left   Pressure 23 19       Pupils      Pupils Dark Light Shape React APD   Right PERRL 4 3 Round 1+ 0   Left PERRL 4 3 Round 1+ 0       Visual Fields      Left Right    Full Full       Extraocular Movement      Right Left    Full Full       Neuro/Psych    Oriented x3:  Yes   Mood/Affect:  Normal       Dilation    Both eyes:  1.0% Mydriacyl, 2.5% Phenylephrine @ 9:36 AM        Slit Lamp and Fundus Exam    Slit Lamp Exam      Right Left   Lids/Lashes Dermatochalasis - upper lid, mild Meibomian gland dysfunction Dermatochalasis - upper lid, mild Meibomian gland dysfunction   Conjunctiva/Sclera White and quiet White and quiet   Cornea Clear Clear   Anterior Chamber Deep and quiet, no cell or flare Deep and quiet, no cell or flare   Iris Round and dilated to 43mm Round and dilated to 48mm   Lens 1+ Nuclear sclerosis, 2+ Cortical cataract 1+ Nuclear sclerosis, 2+ Cortical cataract   Vitreous Mild Vitreous syneresis Mild Vitreous syneresis, old white VH inferiorly,       Fundus Exam      Right Left   Disc Pink and Sharp Pink and Sharp, hyperemia   C/D Ratio 0.2 0.2   Macula Blunted foveal reflex, persistent central edema, with improved exudates, DBH/Microaneurysms - improved, small CWS superior macula   Blunted foveal reflex, persistent central edema with significant exudates greatest temporal to fovea - improving, DBH/MA's improved, few CWS superior macula   Vessels Tortuous, AV crossing changes, Vascular attenuation Tortuous, AV crossing changes   Periphery Attached, scattered MAs and DBH - mostly posterior, scattered CWS -- all improved  Attached, scattered MAs and DBH -- mostly posterior -- all improved          IMAGING AND PROCEDURES  Imaging and Procedures for @TODAY @  OCT, Retina - OU - Both Eyes       Right Eye Quality was good. Central Foveal Thickness: 308. Progression has worsened. Findings include abnormal foveal contour, intraretinal fluid, outer retinal atrophy, no SRF, retinal drusen , intraretinal hyper-reflective material (Mild Interval increase in IRF ST macula ).  Left Eye Quality was good. Central Foveal Thickness: 280. Progression has improved. Findings include intraretinal fluid, epiretinal membrane, no SRF, normal foveal contour, intraretinal hyper-reflective material (Mild interval improvement IRF/SRF).   Notes *Images captured and stored on drive  Diagnosis / Impression:  DME OU OD: Mild Interval increase in IRF ST macula  OS: Mild interval improvement IRF/SRF  Clinical management:  See below  Abbreviations: NFP - Normal foveal profile. CME - cystoid macular edema. PED - pigment epithelial detachment. IRF - intraretinal fluid. SRF - subretinal fluid. EZ - ellipsoid zone. ERM - epiretinal membrane. ORA - outer retinal atrophy. ORT - outer retinal tubulation. SRHM - subretinal hyper-reflective material        Intravitreal Injection, Pharmacologic Agent - OD - Right Eye       Time Out 06/18/2018. 10:52 AM. Confirmed correct patient, procedure, site, and patient consented.   Anesthesia Topical anesthesia was used. Anesthetic medications included Lidocaine 2%, Proparacaine 0.5%.   Procedure Preparation included 5% betadine to ocular surface, eyelid speculum. A 30 gauge needle was used.   Injection:  2 mg aflibercept Alfonse Flavors) SOLN   NDC: A3590391, Lot: [id number], Expiration date: 12/02/2018   Route: Intravitreal, Site: Right Eye, Waste: 0.05 mL  Post-op Post injection exam found visual acuity of at least counting fingers. The patient tolerated the procedure well. There were no  complications. The patient received written and verbal post procedure care education.        Intravitreal Injection, Pharmacologic Agent - OS - Left Eye       Time Out 06/18/2018. 10:52 AM. Confirmed correct patient, procedure, site, and patient consented.   Anesthesia Topical anesthesia was used. Anesthetic medications included Lidocaine 2%, Proparacaine 0.5%.   Procedure Preparation included 5% betadine to ocular surface, eyelid speculum. A 30 gauge needle was used.   Injection:  2 mg aflibercept Alfonse Flavors) SOLN   NDC: M7179715, Lot: [id number], Expiration date: 04/03/2019   Route: Intravitreal, Site: Left Eye, Waste: 0.05 mL  Post-op Post injection exam found visual acuity of at least counting fingers. The patient tolerated the procedure well. There were no complications. The patient received written and verbal post procedure care education.                 ASSESSMENT/PLAN:    ICD-10-CM   1. 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 Retinal edema H35.81 OCT, Retina - OU - Both Eyes  3. Essential hypertension I10   4. Hypertensive retinopathy of both eyes H35.033   5. 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 Severe Non-proliferative diabetic retinopathy, both eyes - initial exam showed massive central DME OU and scattered IRH and IRMA - FA 6.5.19 with patches of capillary nonperfusion; extensive Mas with late leakage OU; no frank NV - S/P IVA OS #1 (06.05.19), #2 (07.08.19), #3 (07.08.19), #4 (09.03.19) - S/P IVA OD #1 (06.07.19), #2 (07.08.19), #3 (07.08.19) - S/P IVE OD #1 (09.03.19), #2 (10.03.19), #3 (11.04.19), #4 (12.02.19), #5 (01.02.20), #6  (02.07.20) - S/P IVE OS #1 (10.03.19), #2 (11.04.19), #3 (12.02.19), #4 (01.02.20), #5 (02.07.20) - FA 01.02.20, shows improvement in late leaking microaneurysms OU - OCT shows persistent / tr increase  in IRF OD, but mild improvement in IRF/SRF OS - BCVA stable at 20/25 OU - discussed findings and OCT - recommend IVE OU today (03.16.20) -- OD #7, OS #6 - pt wishes to proceed - RBA of procedure discussed, questions answered - informed consent obtained and signed - see procedure note -- tolerated well - Eylea paperwork and benefits investigation started on 08.05.19 -- approved w/ Commercial assistance program -- approved for 2020 - f/u in 4 weeks  3,4. Hypertensive retinopathy OU - discussed importance of tight BP control - has had some Bps in hypertensive emergency range (200s / 110s) - discussed likely contribution to DME - pt reports new diagnosis of renal insufficiency which is complicating his HTN - monitor - BP management per nephrology, PCP and cardiology  5. 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 - discussed diagnosis and progression - not yet visually significant - monitor for now   Ophthalmic Meds Ordered this visit:  Meds ordered this encounter  Medications  . aflibercept (EYLEA) SOLN 2 mg  . aflibercept (EYLEA) SOLN 2 mg       Return in about 4 weeks (around 07/16/2018) for f/u NPDR OU, DFE, OCT.  There are no Patient Instructions on file for this visit.   Explained the diagnoses, plan, and follow up with the patient and they expressed understanding.  Patient expressed understanding of the importance of proper follow up care.   This document serves as a record of services personally performed by Gardiner Sleeper, MD, PhD. It was created on their behalf by Ernest Mallick, OA, an ophthalmic assistant. The creation of this record is the provider's dictation and/or  activities during the visit.    Electronically signed by: Ernest Mallick, OA 03.15.2020 12:01 AM     Gardiner Sleeper, M.D., Ph.D. Diseases & Surgery of the Retina and Vitreous [facility]  I have reviewed the above documentation for accuracy and completeness, and I agree with the above. Gardiner Sleeper, M.D., Ph.D. 06/20/18 12:04 AM     Abbreviations: M myopia (nearsighted); A astigmatism; H hyperopia (farsighted); P presbyopia; Mrx spectacle prescription;  CTL contact lenses; OD right eye; OS left eye; OU both eyes  XT exotropia; ET esotropia; PEK punctate epithelial keratitis; PEE punctate epithelial erosions; DES dry eye syndrome; MGD meibomian gland dysfunction; ATs artificial tears; PFAT's preservative free artificial tears; [place] nuclear sclerotic cataract; PSC posterior subcapsular cataract; ERM epi-retinal membrane; PVD posterior vitreous detachment; RD retinal detachment; DM diabetes mellitus; DR diabetic retinopathy; NPDR non-proliferative diabetic retinopathy; PDR proliferative diabetic retinopathy; CSME clinically significant macular edema; DME diabetic macular edema; dbh dot blot hemorrhages; CWS cotton wool spot; POAG primary open angle glaucoma; C/D cup-to-disc ratio; HVF humphrey visual field; GVF goldmann visual field; OCT optical coherence tomography; IOP intraocular pressure; BRVO Branch retinal vein occlusion; CRVO central retinal vein occlusion; CRAO central retinal artery occlusion; BRAO branch retinal artery occlusion; RT retinal tear; SB scleral buckle; PPV pars plana vitrectomy; VH Vitreous hemorrhage; PRP panretinal laser photocoagulation; IVK intravitreal kenalog; VMT vitreomacular traction; MH Macular hole;  NVD neovascularization of the disc; NVE neovascularization elsewhere; AREDS age related eye disease study; ARMD age related macular degeneration; POAG primary open angle glaucoma; EBMD epithelial/anterior basement membrane dystrophy; ACIOL anterior  chamber intraocular lens; IOL intraocular lens; PCIOL posterior chamber intraocular lens; Phaco/IOL phacoemulsification with intraocular lens placement; PRK photorefractive keratectomy; LASIK laser assisted in situ keratomileusis; HTN  hypertension; DM diabetes mellitus; COPD chronic obstructive pulmonary disease

## 2018-06-18 ENCOUNTER — Ambulatory Visit (INDEPENDENT_AMBULATORY_CARE_PROVIDER_SITE_OTHER): Payer: BC Managed Care – PPO | Admitting: Ophthalmology

## 2018-06-18 ENCOUNTER — Other Ambulatory Visit: Payer: Self-pay

## 2018-06-18 DIAGNOSIS — H3581 Retinal edema: Secondary | ICD-10-CM

## 2018-06-18 DIAGNOSIS — I10 Essential (primary) hypertension: Secondary | ICD-10-CM | POA: Diagnosis not present

## 2018-06-18 DIAGNOSIS — E113413 Type 2 diabetes mellitus with severe nonproliferative diabetic retinopathy with macular edema, bilateral: Secondary | ICD-10-CM

## 2018-06-18 DIAGNOSIS — H35033 Hypertensive retinopathy, bilateral: Secondary | ICD-10-CM

## 2018-06-18 DIAGNOSIS — H25813 Combined forms of age-related cataract, bilateral: Secondary | ICD-10-CM

## 2018-06-20 ENCOUNTER — Encounter (INDEPENDENT_AMBULATORY_CARE_PROVIDER_SITE_OTHER): Payer: Self-pay | Admitting: Ophthalmology

## 2018-06-20 MED ORDER — AFLIBERCEPT 2MG/0.05ML IZ SOLN FOR KALEIDOSCOPE
2.0000 mg | INTRAVITREAL | Status: AC | PRN
  Administered 2018-06-20: 2 mg via INTRAVITREAL

## 2018-06-28 ENCOUNTER — Encounter (HOSPITAL_COMMUNITY): Payer: Self-pay | Admitting: Nephrology

## 2018-07-16 ENCOUNTER — Encounter (INDEPENDENT_AMBULATORY_CARE_PROVIDER_SITE_OTHER): Payer: BC Managed Care – PPO | Admitting: Ophthalmology

## 2018-07-20 ENCOUNTER — Telehealth: Payer: Self-pay

## 2018-07-20 NOTE — Telephone Encounter (Signed)
Patient states he needs to speak with his wife before scheduling 6 mo f/u (recall), he will call back .

## 2018-08-22 ENCOUNTER — Encounter (INDEPENDENT_AMBULATORY_CARE_PROVIDER_SITE_OTHER): Payer: Self-pay | Admitting: Ophthalmology

## 2018-08-22 ENCOUNTER — Other Ambulatory Visit: Payer: Self-pay

## 2018-08-22 ENCOUNTER — Ambulatory Visit (INDEPENDENT_AMBULATORY_CARE_PROVIDER_SITE_OTHER): Payer: BC Managed Care – PPO | Admitting: Ophthalmology

## 2018-08-22 DIAGNOSIS — H35033 Hypertensive retinopathy, bilateral: Secondary | ICD-10-CM

## 2018-08-22 DIAGNOSIS — E113413 Type 2 diabetes mellitus with severe nonproliferative diabetic retinopathy with macular edema, bilateral: Secondary | ICD-10-CM

## 2018-08-22 DIAGNOSIS — I10 Essential (primary) hypertension: Secondary | ICD-10-CM

## 2018-08-22 DIAGNOSIS — H3581 Retinal edema: Secondary | ICD-10-CM

## 2018-08-22 DIAGNOSIS — H25813 Combined forms of age-related cataract, bilateral: Secondary | ICD-10-CM

## 2018-08-22 MED ORDER — AFLIBERCEPT 2MG/0.05ML IZ SOLN FOR KALEIDOSCOPE
2.0000 mg | INTRAVITREAL | Status: AC | PRN
  Administered 2018-08-22: 13:00:00 2 mg via INTRAVITREAL

## 2018-08-22 NOTE — Progress Notes (Signed)
[facility] Note  08/22/2018     CHIEF COMPLAINT Patient presents for Retina Follow Up   HISTORY OF PRESENT ILLNESS: Philip Wilson is a 48 y.o. male who presents to the clinic today for:   HPI    Retina Follow Up    Patient presents with  Diabetic Retinopathy.  In both eyes.  This started months ago.  Severity is severe.  Duration of 9 weeks.  Since onset it is stable.  I, the attending physician,  performed the HPI with the patient and updated documentation appropriately.          Comments    48 y/o male pt here for 9 wk f/u for severe NPDR w/mac edema OU.  No change in [place] OU.  Denies pain, flashes, floaters.  No gtts.  BS 72 this a.m.  A1C 6.5.       Last edited by Bernarda Caffey, MD on 08/22/2018 12:50 PM. (History)    pt states he missed his last appt bc his wife was worried about COVID, he states his kidneys are rapidly deteriorating, but his blood pressure is much better (around 140/80), he states he is not receiving dialysis yet, but states he has sx on June 23 to get a shunt, he states he is on the kidney transplant list at [facility], pt states his vision is stable from last visit  Referring physician: Lillard Anes, MD [address], [place] [address]  HISTORICAL INFORMATION:   Selected notes from the [place] Referred by Dr. Ricki Gillean for diabetic retinopathy OU LEE: 05.28.19 (A. Hager) [BCVA: OD: 20/40 OS: 20/50+2] Ocular Hx-Diabetic Retinopathy PMH-DM    CURRENT MEDICATIONS: No current outpatient medications on file. (Ophthalmic Drugs)   Current Facility-Administered Medications (Ophthalmic Drugs)  Medication Route  . aflibercept (EYLEA) SOLN 2 mg Intravitreal  . aflibercept (EYLEA) SOLN 2 mg Intravitreal  . aflibercept (EYLEA) SOLN 2 mg Intravitreal  . aflibercept (EYLEA) SOLN 2 mg Intravitreal  . aflibercept (EYLEA) SOLN 2 mg Intravitreal  . aflibercept (EYLEA) SOLN 2 mg Intravitreal  .  aflibercept (EYLEA) SOLN 2 mg Intravitreal  . aflibercept (EYLEA) SOLN 2 mg Intravitreal  . aflibercept (EYLEA) SOLN 2 mg Intravitreal  . aflibercept (EYLEA) SOLN 2 mg Intravitreal  . aflibercept (EYLEA) SOLN 2 mg Intravitreal   Current Outpatient Medications (Other)  Medication Sig  . ACCU-CHEK GUIDE test strip every morning. Use to test fasting blood glucose  . atorvastatin (LIPITOR) 40 MG tablet Take 40 mg by mouth daily at 6 PM.   . cloNIDine (CATAPRES) 0.1 MG tablet Take 0.1 mg by mouth 2 (two) times daily.   . furosemide (LASIX) 40 MG tablet Take 40 mg by mouth daily.  Marland Kitchen glimepiride (AMARYL) 4 MG tablet Take 4 mg by mouth daily with breakfast.   . labetalol (NORMODYNE) 200 MG tablet Take 200 mg by mouth 2 (two) times daily.   Marland Kitchen lisinopril (PRINIVIL,ZESTRIL) 20 MG tablet Take 20 mg by mouth daily.  . metFORMIN (GLUCOPHAGE) 1000 MG tablet Take 1,000 mg by mouth 2 (two) times daily with a meal.    Current Facility-Administered Medications (Other)  Medication Route  . Bevacizumab (AVASTIN) SOLN 1.25 mg Intravitreal  . Bevacizumab (AVASTIN) SOLN 1.25 mg Intravitreal  . Bevacizumab (AVASTIN) SOLN 1.25 mg Intravitreal  . Bevacizumab (AVASTIN) SOLN 1.25 mg Intravitreal  . Bevacizumab (AVASTIN) SOLN 1.25 mg Intravitreal  . Bevacizumab (AVASTIN) SOLN 1.25 mg Intravitreal  . Bevacizumab (AVASTIN)  SOLN 1.25 mg Intravitreal      REVIEW OF SYSTEMS: ROS    Positive for: Genitourinary, Endocrine, Eyes   Negative for: Constitutional, Gastrointestinal, Neurological, Skin, Musculoskeletal, HENT, Cardiovascular, Respiratory, Psychiatric, Allergic/Imm, Heme/Lymph   Last edited by Matthew Folks, COA on 08/22/2018  8:59 AM. (History)       ALLERGIES No Known Allergies  PAST MEDICAL HISTORY Past Medical History:  Diagnosis Date  . Cataract    OU  . Chronic kidney disease 05/04/2018  . Diabetes 1.5, managed as type 2 ([place])   . Diabetic retinopathy ([place])    NPDR OU  . Hypertension    . Hypertensive retinopathy    OU   History reviewed. No pertinent surgical history.  FAMILY HISTORY Family History  Problem Relation Age of Onset  . Heart failure Mother     SOCIAL HISTORY Social History   Tobacco Use  . Smoking status: Former Research scientist (life sciences)  . Smokeless tobacco: Never Used  Substance Use Topics  . Alcohol use: Yes    Alcohol/week: 1.0 standard drinks    Types: 1 Cans of beer per week  . Drug use: Never         OPHTHALMIC EXAM:  Base Eye Exam    Visual Acuity (Snellen - Linear)      Right Left   Dist [place] 20/30 -2 20/25 +2   Dist ph [place] 20/25 -2 NI       Tonometry (Tonopen, 9:00 AM)      Right Left   Pressure 23 20       Pupils      Dark Light Shape React APD   Right 4 3 Round Brisk None   Left 4 3 Round Brisk None       Visual Fields (Counting fingers)      Left Right    Full Full       Extraocular Movement      Right Left    Full, Ortho Full, Ortho       Neuro/Psych    Oriented x3:  Yes   Mood/Affect:  Normal       Dilation    Both eyes:  1.0% Mydriacyl, 2.5% Phenylephrine @ 9:00 AM        Slit Lamp and Fundus Exam    Slit Lamp Exam      Right Left   Lids/Lashes Dermatochalasis - upper lid, mild Meibomian gland dysfunction Dermatochalasis - upper lid, mild Meibomian gland dysfunction   Conjunctiva/Sclera White and quiet White and quiet   Cornea Clear Clear   Anterior Chamber Deep and quiet, no cell or flare Deep and quiet, no cell or flare   Iris Round and dilated to 41m Round and dilated to 721m  Lens 1+ Nuclear sclerosis, 2+ Cortical cataract 1+ Nuclear sclerosis, 2+ Cortical cataract   Vitreous Mild Vitreous syneresis Mild Vitreous syneresis, old white VH inferiorly,       Fundus Exam      Right Left   Disc Pink and Sharp Pink and Sharp, hyperemia   C/D Ratio 0.2 0.2   Macula Blunted foveal reflex, persistent central edema, with improved exudates, DBH/Microaneurysms - improved   Blunted foveal reflex, persistent central  edema with exudates ST to fovea - improving, DBH/MA's improved   Vessels Tortuous, AV crossing changes, Vascular attenuation Tortuous, AV crossing changes   Periphery Attached, scattered MAs and DBH - mostly posterior, scattered CWS -- all improved Attached, scattered MAs and DBH -- mostly posterior -- all improved  IMAGING AND PROCEDURES  Imaging and Procedures for _0 @  OCT, Retina - OU - Both Eyes       Right Eye Quality was good. Central Foveal Thickness: 366. Progression has worsened. Findings include abnormal foveal contour, intraretinal fluid, outer retinal atrophy, retinal drusen , intraretinal hyper-reflective material, subretinal fluid (Mild Interval increase in IRF/SRF; partial PVD).   Left Eye Quality was good. Central Foveal Thickness: 288. Progression has worsened. Findings include intraretinal fluid, epiretinal membrane, no SRF, intraretinal hyper-reflective material, abnormal foveal contour (Mild interval increase IRF/IRHM).   Notes *Images captured and stored on drive  Diagnosis / Impression:  DME OU OD: Mild Interval increase in IRF/SRF; partial PVD OS: Mild interval increase IRF/IRHM   Clinical management:  See below  Abbreviations: NFP - Normal foveal profile. CME - cystoid macular edema. PED - pigment epithelial detachment. IRF - intraretinal fluid. SRF - subretinal fluid. EZ - ellipsoid zone. ERM - epiretinal membrane. ORA - outer retinal atrophy. ORT - outer retinal tubulation. SRHM - subretinal hyper-reflective material        Intravitreal Injection, Pharmacologic Agent - OD - Right Eye       Time Out 08/22/2018. 10:08 AM. Confirmed correct patient, procedure, site, and patient consented.   Anesthesia Topical anesthesia was used. Anesthetic medications included Lidocaine 2%, Proparacaine 0.5%.   Procedure Preparation included 5% betadine to ocular surface, eyelid speculum. A 30 gauge needle was used.   Injection:  2 mg aflibercept  Alfonse Flavors) SOLN   NDC: A3590391, Lot: [id number], Expiration date: 12/23/2018   Route: Intravitreal, Site: Right Eye, Waste: 0.05 mL  Post-op Post injection exam found visual acuity of at least counting fingers. The patient tolerated the procedure well. There were no complications. The patient received written and verbal post procedure care education.        Intravitreal Injection, Pharmacologic Agent - OS - Left Eye       Time Out 08/22/2018. 10:07 AM. Confirmed correct patient, procedure, site, and patient consented.   Anesthesia Topical anesthesia was used. Anesthetic medications included Lidocaine 2%, Proparacaine 0.5%.   Procedure Preparation included 5% betadine to ocular surface, eyelid speculum. A 30 gauge needle was used.   Injection:  2 mg aflibercept Alfonse Flavors) SOLN   NDC: A3590391, Lot: [id number], Expiration date: 01/02/2019   Route: Intravitreal, Site: Left Eye, Waste: 0.05 mL  Post-op Post injection exam found visual acuity of at least counting fingers. The patient tolerated the procedure well. There were no complications. The patient received written and verbal post procedure care education.                 ASSESSMENT/PLAN:    ICD-10-CM   1. 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 Retinal edema H35.81 OCT, Retina - OU - Both Eyes  3. Essential hypertension I10   4. Hypertensive retinopathy of both eyes H35.033   5. 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 Severe Non-proliferative diabetic retinopathy, both eyes  - initial exam showed massive central DME OU and scattered IRH and IRMA  - FA 6.5.19 with patches of capillary nonperfusion;  extensive Mas with late leakage OU; no frank NV  - S/P IVA OS #  1 (06.05.19), #2 (07.08.19), #3 (07.08.19), #4 (09.03.19)  - S/P IVA OD #1 (06.07.19), #2 (07.08.19), #3 (07.08.19)  - S/P IVE OD #1 (09.03.19), #2 (10.03.19), #3 (11.04.19), #4 (12.02.19), #5 (01.02.20), #6 (02.07.20), #7 (03.16.20)  - S/P IVE OS #1 (10.03.19), #2 (11.04.19), #3 (12.02.19), #4 (01.02.20), #5 (02.07.20), #6 (03.16.20)  - FA 01.02.20, shows improvement in late leaking microaneurysms OU  - today, delayed f/u to 9 wks from 4 wks  - OCT shows mild interval increase in IRF OU despite delay in f/u -- likely not worse due to significant improvement in BP  - BCVA stable at 20/25 OU  - discussed findings and OCT  - recommend IVE OU today (05.20.20) -- OD #8, OS #5  - pt wishes to proceed  - RBA of procedure discussed, questions answered  - informed consent obtained and signed  - see procedure note -- tolerated well  - Eylea paperwork and benefits investigation started on 08.05.19 -- approved w/ Commercial assistance program -- approved for 2020  - f/u in 5 weeks  3,4. Hypertensive retinopathy OU  - discussed importance of tight BP control  - has had some Bps in hypertensive emergency range (200s / 110s) -- today pt reports significant improvement  - discussed likely contribution to DME  - pt reports new diagnosis of renal insufficiency/failure which is complicating his HTN  - monitor  - BP management per nephrology, PCP and cardiology   5. Combined form age-related cataract OU-   - The symptoms of cataract, surgical options, and treatments and risks were discussed with patient.  - discussed diagnosis and progression  - not yet visually significant  - monitor for now   Ophthalmic Meds Ordered this visit:  Meds ordered this encounter  Medications  . aflibercept (EYLEA) SOLN 2 mg  . aflibercept (EYLEA) SOLN 2 mg       Return for f/u June 23, NPDR OU, DFE, OCT.  There are no Patient Instructions on file for  this visit.   Explained the diagnoses, plan, and follow up with the patient and they expressed understanding.  Patient expressed understanding of the importance of proper follow up care.   This document serves as a record of services personally performed by Gardiner Sleeper, MD, PhD. It was created on their behalf by Ernest Mallick, OA, an ophthalmic assistant. The creation of this record is the provider's dictation and/or activities during the visit.    Electronically signed by: Ernest Mallick, OA 03.15.2020 12:52 PM    Gardiner Sleeper, M.D., Ph.D. Diseases & Surgery of the Retina and Vitreous [facility]  I have reviewed the above documentation for accuracy and completeness, and I agree with the above. Gardiner Sleeper, M.D., Ph.D. 06/20/18 12:52 PM   Abbreviations: M myopia (nearsighted); A astigmatism; H hyperopia (farsighted); P presbyopia; Mrx spectacle prescription;  CTL contact lenses; OD right eye; OS left eye; OU both eyes  XT exotropia; ET esotropia; PEK punctate epithelial keratitis; PEE punctate epithelial erosions; DES dry eye syndrome; MGD meibomian gland dysfunction; ATs artificial tears; PFAT's preservative free artificial tears; [place] nuclear sclerotic cataract; PSC posterior subcapsular cataract; ERM epi-retinal membrane; PVD posterior vitreous detachment; RD retinal detachment; DM diabetes mellitus; DR diabetic retinopathy; NPDR non-proliferative diabetic retinopathy; PDR proliferative diabetic retinopathy; CSME clinically significant macular edema; DME diabetic macular edema; dbh dot blot hemorrhages; CWS cotton wool spot; POAG primary open angle glaucoma; C/D cup-to-disc ratio; HVF humphrey visual field; GVF goldmann visual field; OCT optical  coherence tomography; IOP intraocular pressure; BRVO Branch retinal vein occlusion; CRVO central retinal vein occlusion; CRAO central retinal artery occlusion; BRAO branch retinal artery occlusion; RT retinal tear; SB  scleral buckle; PPV pars plana vitrectomy; VH Vitreous hemorrhage; PRP panretinal laser photocoagulation; IVK intravitreal kenalog; VMT vitreomacular traction; MH Macular hole;  NVD neovascularization of the disc; NVE neovascularization elsewhere; AREDS age related eye disease study; ARMD age related macular degeneration; POAG primary open angle glaucoma; EBMD epithelial/anterior basement membrane dystrophy; ACIOL anterior chamber intraocular lens; IOL intraocular lens; PCIOL posterior chamber intraocular lens; Phaco/IOL phacoemulsification with intraocular lens placement; [place] photorefractive keratectomy; LASIK laser assisted in situ keratomileusis; HTN hypertension; DM diabetes mellitus; COPD chronic obstructive pulmonary disease

## 2018-09-20 ENCOUNTER — Other Ambulatory Visit: Payer: Self-pay

## 2018-09-20 DIAGNOSIS — N289 Disorder of kidney and ureter, unspecified: Secondary | ICD-10-CM

## 2018-09-23 NOTE — Progress Notes (Signed)
[facility] Note  09/24/2018     CHIEF COMPLAINT Patient presents for Retina Follow Up   HISTORY OF PRESENT ILLNESS: Philip Wilson is a 48 y.o. male who presents to the clinic today for:   HPI    Retina Follow Up    Patient presents with  Diabetic Retinopathy.  In both eyes.  This started 5 weeks ago.  Severity is moderate.  I, the attending physician,  performed the HPI with the patient and updated documentation appropriately.          Comments    Patient here for 5 weeks retina follow up for PDR OU. Patient states vision doing fine. About the same. No eye pain.       Last edited by Bernarda Caffey, MD on 09/24/2018  5:05 PM. (History)    pt states he still has not started dialysis yet, and states his eyes are doing well, he states his blood pressure is under control now   Referring physician: Lillard Anes, MD [address] [place] [place],  [place] [address]  HISTORICAL INFORMATION:   Selected notes from the MEDICAL RECORD NUMBER Referred by Dr. Ricki Lehnert for diabetic retinopathy OU LEE: 05.28.19 (A. [facility]ager) [BCVA: OD: 20/40 OS: 20/50+2] Ocular Hx-Diabetic Retinopathy PMH-DM    CURRENT MEDICATIONS: No current outpatient medications on file. (Ophthalmic Drugs)   Current Facility-Administered Medications (Ophthalmic Drugs)  Medication Route  . aflibercept (EYLEA) SOLN 2 mg Intravitreal  . aflibercept (EYLEA) SOLN 2 mg Intravitreal  . aflibercept (EYLEA) SOLN 2 mg Intravitreal  . aflibercept (EYLEA) SOLN 2 mg Intravitreal  . aflibercept (EYLEA) SOLN 2 mg Intravitreal  . aflibercept (EYLEA) SOLN 2 mg Intravitreal  . aflibercept (EYLEA) SOLN 2 mg Intravitreal  . aflibercept (EYLEA) SOLN 2 mg Intravitreal  . aflibercept (EYLEA) SOLN 2 mg Intravitreal  . aflibercept (EYLEA) SOLN 2 mg Intravitreal  . aflibercept (EYLEA) SOLN 2 mg Intravitreal   Current Outpatient Medications (Other)  Medication Sig  . ACCU-CHEK GUIDE test  strip every morning. Use to test fasting blood glucose  . atorvastatin (LIPITOR) 40 MG tablet Take 40 mg by mouth daily at 6 PM.   . cloNIDine (CATAPRES) 0.1 MG tablet Take 0.1 mg by mouth 2 (two) times daily.   . furosemide (LASIX) 40 MG tablet Take 40 mg by mouth daily.  Marland Kitchen glimepiride (AMARYL) 4 MG tablet Take 4 mg by mouth daily with breakfast.   . labetalol (NORMODYNE) 200 MG tablet Take 200 mg by mouth 2 (two) times daily.   Marland Kitchen lisinopril (PRINIVIL,ZESTRIL) 20 MG tablet Take 20 mg by mouth daily.  . metFORMIN (GLUCOPHAGE) 1000 MG tablet Take 1,000 mg by mouth 2 (two) times daily with a meal.    Current Facility-Administered Medications (Other)  Medication Route  . Bevacizumab (AVASTIN) SOLN 1.25 mg Intravitreal  . Bevacizumab (AVASTIN) SOLN 1.25 mg Intravitreal  . Bevacizumab (AVASTIN) SOLN 1.25 mg Intravitreal  . Bevacizumab (AVASTIN) SOLN 1.25 mg Intravitreal  . Bevacizumab (AVASTIN) SOLN 1.25 mg Intravitreal  . Bevacizumab (AVASTIN) SOLN 1.25 mg Intravitreal  . Bevacizumab (AVASTIN) SOLN 1.25 mg Intravitreal      REVIEW OF SYSTEMS: ROS    Positive for: Genitourinary, Endocrine, Cardiovascular, Eyes   Negative for: Constitutional, Gastrointestinal, Neurological, Skin, Musculoskeletal, HENT, Respiratory, Psychiatric, Allergic/Imm, Heme/Lymph   Last edited by Theodore Demark on 09/24/2018  3:00 PM. (History)       ALLERGIES No Known Allergies  PAST MEDICAL HISTORY Past Medical History:  Diagnosis Date  . Cataract    OU  . Chronic kidney disease 05/04/2018  . Diabetes 1.5, managed as type 2 ([place])   . Diabetic retinopathy ([place])    NPDR OU  . Hypertension   . Hypertensive retinopathy    OU   History reviewed. No pertinent surgical history.  FAMILY HISTORY Family History  Problem Relation Age of Onset  . Heart failure Mother     SOCIAL HISTORY Social History   Tobacco Use  . Smoking status: Former Research scientist (life sciences)  . Smokeless tobacco: Never Used  Substance Use  Topics  . Alcohol use: Yes    Alcohol/week: 1.0 standard drinks    Types: 1 Cans of beer per week  . Drug use: Never         OPHTHALMIC EXAM:  Base Eye Exam    Visual Acuity (Snellen - Linear)      Right Left   Dist [place] 20/20 -1 20/20 -2       Tonometry (Tonopen, 2:58 PM)      Right Left   Pressure 20 19       Pupils      Dark Light Shape React APD   Right 4 3 Round Brisk None   Left 4 3 Round Brisk None       Visual Fields (Counting fingers)      Left Right    Full Full       Extraocular Movement      Right Left    Full, Ortho Full, Ortho       Neuro/Psych    Oriented x3: Yes   Mood/Affect: Normal       Dilation    Both eyes: 1.0% Mydriacyl, 2.5% Phenylephrine @ 2:58 PM        Slit Lamp and Fundus Exam    Slit Lamp Exam      Right Left   Lids/Lashes Dermatochalasis - upper lid, mild Meibomian gland dysfunction Dermatochalasis - upper lid, mild Meibomian gland dysfunction   Conjunctiva/Sclera White and quiet White and quiet   Cornea Clear Clear   Anterior Chamber Deep and quiet, no cell or flare Deep and quiet, no cell or flare   Iris Round and dilated to 43mm Round and dilated to 73mm   Lens 1+ Nuclear sclerosis, 2+ Cortical cataract 1+ Nuclear sclerosis, 2+ Cortical cataract   Vitreous Mild Vitreous syneresis Mild Vitreous syneresis, old white VH inferiorly,       Fundus Exam      Right Left   Disc Pink and Sharp Pink and Sharp   C/D Ratio 0.2 0.2   Macula good foveal reflex, mild cystic changes and MA with improved exudates, mild ERM Blunted foveal reflex, persistent central edema with exudates ST to fovea - improving, DBH/MA's improved   Vessels Tortuous, AV crossing changes, Vascular attenuation Tortuous, AV crossing changes   Periphery Attached, scattered MAs and DBH - mostly posterior, scattered CWS -- almost resolved Attached, scattered MAs and DBH -- mostly posterior -- all improved          IMAGING AND PROCEDURES  Imaging and Procedures  for @TODAY @  OCT, Retina - OU - Both Eyes       Right Eye Quality was good. Central Foveal Thickness: 288. Progression has improved. Findings include intraretinal fluid, outer retinal atrophy, retinal drusen , intraretinal hyper-reflective material, normal foveal contour, no SRF (Massive interval improvement in IRF/foveal profile).   Left Eye Quality was good. Central Foveal Thickness: 299. Progression has improved.  Findings include intraretinal fluid, epiretinal membrane, no SRF, intraretinal hyper-reflective material, abnormal foveal contour (Mild interval improvement IRF).   Notes *Images captured and stored on drive  Diagnosis / Impression:  DME OU OD: Massive interval improvement in IRF/foveal profile OS: Mild interval improvement IRF  Clinical management:  See below  Abbreviations: NFP - Normal foveal profile. CME - cystoid macular edema. PED - pigment epithelial detachment. IRF - intraretinal fluid. SRF - subretinal fluid. EZ - ellipsoid zone. ERM - epiretinal membrane. ORA - outer retinal atrophy. ORT - outer retinal tubulation. SRHM - subretinal hyper-reflective material        Intravitreal Injection, Pharmacologic Agent - OD - Right Eye       Time Out 09/24/2018. 5:08 PM. Confirmed correct patient, procedure, site, and patient consented.   Anesthesia Topical anesthesia was used. Anesthetic medications included Lidocaine 2%, Proparacaine 0.5%.   Procedure Preparation included 5% betadine to ocular surface, eyelid speculum. A 30 gauge needle was used.   Injection:  2 mg aflibercept Alfonse Flavors) SOLN   NDC: M7179715, Lot: [id number], Expiration date: 05/04/2019   Route: Intravitreal, Site: Right Eye, Waste: 0.05 mL  Post-op Post injection exam found visual acuity of at least counting fingers. The patient tolerated the procedure well. There were no complications. The patient received written and verbal post procedure care education.        Intravitreal Injection,  Pharmacologic Agent - OS - Left Eye       Time Out 09/24/2018. 5:07 PM. Confirmed correct patient, procedure, site, and patient consented.   Anesthesia Topical anesthesia was used. Anesthetic medications included Lidocaine 2%, Proparacaine 0.5%.   Procedure Preparation included 5% betadine to ocular surface, eyelid speculum. A 30 gauge needle was used.   Injection:  2 mg aflibercept Alfonse Flavors) SOLN   NDC: A3590391, Lot: [id number], Expiration date: 02/01/2019   Route: Intravitreal, Site: Left Eye, Waste: 0.05 mL  Post-op Post injection exam found visual acuity of at least counting fingers. The patient tolerated the procedure well. There were no complications. The patient received written and verbal post procedure care education.                 ASSESSMENT/PLAN:    ICD-10-CM   1. 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 Retinal edema  H35.81 OCT, Retina - OU - Both Eyes  3. Essential hypertension  I10   4. Hypertensive retinopathy of both eyes  H35.033   5. Combined forms of age-related cataract of both eyes  H25.813     1, 2. Severe Non-proliferative diabetic retinopathy, both eyes  - initial exam showed massive central DME OU and scattered IRH and IRMA  - FA 6.5.19 with patches of capillary nonperfusion; extensive Mas with late leakage OU; no frank NV  - S/P IVA OS #1 (06.05.19), #2 (07.08.19), #3 (07.08.19), #4 (09.03.19)  - S/P IVA OD #1 (06.07.19), #2 (07.08.19), #3 (07.08.19)  - S/P IVE OD #1 (09.03.19), #2 (10.03.19), #3 (11.04.19), #4 (12.02.19), #5 (01.02.20), #6 (02.07.20), #7 (03.16.20), #8 (05.20.20)  - S/P IVE OS #1 (10.03.19), #2 (11.04.19), #3 (12.02.19), #4 (01.02.20), #5  (02.07.20), #6 (03.16.20), #7 (05.20.20)  - FA 01.02.20, shows improvement in late leaking microaneurysms OU  -  OCT shows massive improvement in IRF/foveal contour OD, milder improvement in IRF OS  - BCVA stable at 20/20 OU  - discussed findings and OCT  - recommend IVE OU today (06.22.20) -- OD #9, OS #8  - pt wishes to proceed  - RBA of procedure discussed, questions answered  - informed consent obtained and signed  - see procedure note -- tolerated well  - Eylea paperwork and benefits investigation started on 08.05.19 -- approved w/ Commercial assistance program -- approved for 2020  - f/u in 5 weeks  3,4. Hypertensive retinopathy OU  - discussed importance of tight BP control  - has had some Bps in hypertensive emergency range (200s / 110s) -- today pt reports significant improvement  - discussed likely contribution to DME  - pt reports new diagnosis of renal insufficiency/failure which is complicating his HTN  - monitor  - BP management per nephrology, PCP and cardiology   5. Combined form age-related cataract OU-   - The symptoms of cataract, surgical options, and treatments and risks were discussed with patient.  - discussed diagnosis and progression  - not yet visually significant  - monitor for now   Ophthalmic Meds Ordered this visit:  Meds ordered this encounter  Medications  . aflibercept (EYLEA) SOLN 2 mg  . aflibercept (EYLEA) SOLN 2 mg       Return in about 5 weeks (around 10/29/2018) for f/u NPDR OU, DFE, OCT.  There are no Patient Instructions on file for this visit.   Explained the diagnoses, plan, and follow up with the patient and they expressed understanding.  Patient expressed understanding of the importance of proper follow up care.   This document serves as a record of services personally performed by Gardiner Sleeper, MD, PhD. It was created on their behalf by Ernest Mallick, OA, an ophthalmic assistant. The creation of this record is the provider's  dictation and/or activities during the visit.    Electronically signed by: Ernest Mallick, OA  06.22.2020 5:21 PM     Gardiner Sleeper, M.D., Ph.D. Diseases & Surgery of the Retina and Vitreous [facility]  I have reviewed the above documentation for accuracy and completeness, and I agree with the above. Gardiner Sleeper, M.D., Ph.D. 09/24/18 5:22 PM    Abbreviations: M myopia (nearsighted); A astigmatism; H hyperopia (farsighted); P presbyopia; Mrx spectacle prescription;  CTL contact lenses; OD right eye; OS left eye; OU both eyes  XT exotropia; ET esotropia; PEK punctate epithelial keratitis; PEE punctate epithelial erosions; DES dry eye syndrome; MGD meibomian gland dysfunction; ATs artificial tears; PFAT's preservative free artificial tears; [place] nuclear sclerotic cataract; PSC posterior subcapsular cataract; ERM epi-retinal membrane; PVD posterior vitreous detachment; RD retinal detachment; DM diabetes mellitus; DR diabetic retinopathy; NPDR non-proliferative diabetic retinopathy; PDR proliferative diabetic retinopathy; CSME clinically significant macular edema; DME diabetic macular edema; dbh dot blot hemorrhages; CWS cotton wool spot; POAG primary open angle glaucoma; C/D cup-to-disc ratio; HVF humphrey visual field; GVF goldmann visual field; OCT optical coherence tomography; IOP intraocular pressure; BRVO Branch retinal vein occlusion; CRVO central retinal vein occlusion; CRAO central retinal artery occlusion; BRAO branch retinal artery occlusion; RT retinal tear; SB scleral buckle; PPV pars plana vitrectomy; VH Vitreous hemorrhage; PRP panretinal laser photocoagulation; IVK intravitreal kenalog; VMT vitreomacular traction; MH Macular hole;  NVD neovascularization of the disc; NVE neovascularization elsewhere; AREDS age related eye disease study; ARMD age related macular degeneration; POAG primary open angle glaucoma; EBMD epithelial/anterior basement membrane dystrophy;  ACIOL anterior chamber intraocular lens; IOL intraocular lens; PCIOL posterior chamber intraocular lens; Phaco/IOL phacoemulsification with intraocular lens placement; [place] photorefractive keratectomy; LASIK laser assisted in situ keratomileusis; HTN hypertension; DM diabetes mellitus; COPD chronic obstructive pulmonary disease

## 2018-09-24 ENCOUNTER — Telehealth (HOSPITAL_COMMUNITY): Payer: Self-pay | Admitting: *Deleted

## 2018-09-24 ENCOUNTER — Ambulatory Visit (INDEPENDENT_AMBULATORY_CARE_PROVIDER_SITE_OTHER): Payer: BC Managed Care – PPO | Admitting: Ophthalmology

## 2018-09-24 ENCOUNTER — Other Ambulatory Visit: Payer: Self-pay

## 2018-09-24 ENCOUNTER — Encounter (INDEPENDENT_AMBULATORY_CARE_PROVIDER_SITE_OTHER): Payer: Self-pay | Admitting: Ophthalmology

## 2018-09-24 DIAGNOSIS — I10 Essential (primary) hypertension: Secondary | ICD-10-CM | POA: Diagnosis not present

## 2018-09-24 DIAGNOSIS — H3581 Retinal edema: Secondary | ICD-10-CM | POA: Diagnosis not present

## 2018-09-24 DIAGNOSIS — H25813 Combined forms of age-related cataract, bilateral: Secondary | ICD-10-CM

## 2018-09-24 DIAGNOSIS — H35033 Hypertensive retinopathy, bilateral: Secondary | ICD-10-CM | POA: Diagnosis not present

## 2018-09-24 DIAGNOSIS — E113413 Type 2 diabetes mellitus with severe nonproliferative diabetic retinopathy with macular edema, bilateral: Secondary | ICD-10-CM

## 2018-09-24 MED ORDER — AFLIBERCEPT 2MG/0.05ML IZ SOLN FOR KALEIDOSCOPE
2.0000 mg | INTRAVITREAL | Status: AC | PRN
  Administered 2018-09-24: 2 mg via INTRAVITREAL

## 2018-09-24 NOTE — Telephone Encounter (Signed)
The above patient or their representative was contacted and gave the following answers to these questions:         Do you have any of the following symptoms?n Fever                    Cough                   Shortness of breath  Do  you have any of the following other symptoms? n   muscle pain         vomiting,        diarrhea        rash         weakness        red eye        abdominal pain         bruising          bruising or bleeding              joint pain           severe headache    Have you been in contact with someone who was or has been sick in the past 2 weeks?n  Yes                 Unsure                         Unable to assess   Does the person that you were in contact with have any of the following symptoms?   Cough         shortness of breath           muscle pain         vomiting,            diarrhea            rash            weakness           fever            red eye           abdominal pain           bruising  or  bleeding                joint pain                severe headache               Have you  or someone you have been in contact with traveled internationally in th last month?     n    If yes, which countries?   Have you  or someone you have been in contact with traveled outside [place] in th last month?         If yes, which state and city?   COMMENTS OR ACTION PLAN FOR THIS PATIENT:

## 2018-09-25 ENCOUNTER — Other Ambulatory Visit: Payer: Self-pay | Admitting: *Deleted

## 2018-09-25 ENCOUNTER — Other Ambulatory Visit (HOSPITAL_COMMUNITY)
Admission: RE | Admit: 2018-09-25 | Discharge: 2018-09-25 | Disposition: A | Discharge status: Home / Self Care | Payer: BC Managed Care – PPO | Source: Ambulatory Visit | Attending: Vascular Surgery | Admitting: Vascular Surgery

## 2018-09-25 ENCOUNTER — Ambulatory Visit (HOSPITAL_COMMUNITY)
Admission: RE | Admit: 2018-09-25 | Discharge: 2018-09-25 | Disposition: A | Discharge status: Home / Self Care | Payer: BC Managed Care – PPO | Source: Ambulatory Visit | Attending: Family | Admitting: Family

## 2018-09-25 ENCOUNTER — Ambulatory Visit: Payer: BC Managed Care – PPO | Admitting: Vascular Surgery

## 2018-09-25 ENCOUNTER — Ambulatory Visit (INDEPENDENT_AMBULATORY_CARE_PROVIDER_SITE_OTHER)
Admission: RE | Admit: 2018-09-25 | Discharge: 2018-09-25 | Disposition: A | Discharge status: Home / Self Care | Payer: BC Managed Care – PPO | Source: Ambulatory Visit | Attending: Vascular Surgery | Admitting: Vascular Surgery

## 2018-09-25 ENCOUNTER — Other Ambulatory Visit: Payer: Self-pay

## 2018-09-25 ENCOUNTER — Encounter: Payer: Self-pay | Admitting: Vascular Surgery

## 2018-09-25 ENCOUNTER — Encounter: Payer: Self-pay | Admitting: *Deleted

## 2018-09-25 DIAGNOSIS — N289 Disorder of kidney and ureter, unspecified: Secondary | ICD-10-CM

## 2018-09-25 DIAGNOSIS — N184 Chronic kidney disease, stage 4 (severe): Secondary | ICD-10-CM | POA: Insufficient documentation

## 2018-09-25 DIAGNOSIS — Z1159 Encounter for screening for other viral diseases: Secondary | ICD-10-CM | POA: Insufficient documentation

## 2018-09-25 DIAGNOSIS — Z01818 Encounter for other preprocedural examination: Secondary | ICD-10-CM | POA: Diagnosis not present

## 2018-09-25 LAB — SARS CORONAVIRUS 2 (TAT 6-24 HRS): SARS Coronavirus 2: NEGATIVE

## 2018-09-25 NOTE — Progress Notes (Signed)
Patient name: Philip Wilson MRN: [id number] DOB: [birth year] Sex: male  REASON FOR CONSULT: New AVF access  HPI: Philip Wilson is a 48 y.o. male with history of stage IV chronic kidney disease, hypertension, and diabetes that presents for new dialysis access evaluation.  Patient states he is right-handed.  He has never had any previous dialysis access.  No catheters or chest wall implants.  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  Has not started dialysis at this time.  Kidney disease is related to diabetes and HTN.  No weakness, numbness, tissue loss in either hand.  Past Medical History:  Diagnosis Date  . Cataract    OU  . Chronic kidney disease 05/04/2018  . Diabetes 1.5, managed as type 2 ([place])   . Diabetic retinopathy ([place])    NPDR OU  . Hypertension   . Hypertensive retinopathy    OU    History reviewed. No pertinent surgical history.  Family History  Problem Relation Age of Onset  . Heart failure Mother     SOCIAL HISTORY: Social History   Socioeconomic History  . Marital status: Married    Spouse name: Not on file  . Number of children: Not on file  . Years of education: Not on file  . Highest education level: Not on file  Occupational History  . Occupation: Geographical information systems officer  . Financial resource strain: Not on file  . Food insecurity    Worry: Not on file    Inability: Not on file  . Transportation needs    Medical: Not on file    Non-medical: Not on file  Tobacco Use  . Smoking status: Former Research scientist (life sciences)  . Smokeless tobacco: Never Used  Substance and Sexual Activity  . Alcohol use: Yes    Alcohol/week: 1.0 standard drinks    Types: 1 Cans of beer per week  . Drug use: Never  . Sexual activity: Not on file  Lifestyle  . Physical activity    Days per week: Not on file    Minutes per session: Not on file  . Stress: Not on file  Relationships  . Social Herbalist on phone: Not on file    Gets together: Not on file     Attends religious service: Not on file    Active member of club or organization: Not on file    Attends meetings of clubs or organizations: Not on file    Relationship status: Not on file  . Intimate partner violence    Fear of current or ex partner: Not on file    Emotionally abused: Not on file    Physically abused: Not on file    Forced sexual activity: Not on file  Other Topics Concern  . Not on file  Social History Narrative  . Not on file    No Known Allergies  Current Outpatient Medications  Medication Sig Dispense Refill  . ACCU-CHEK GUIDE test strip every morning. Use to test fasting blood glucose  4  . atorvastatin (LIPITOR) 40 MG tablet Take 40 mg by mouth daily at 6 PM.     . cloNIDine (CATAPRES) 0.1 MG tablet Take 0.1 mg by mouth 2 (two) times daily.     . furosemide (LASIX) 40 MG tablet Take 40 mg by mouth daily.    Marland Kitchen glimepiride (AMARYL) 4 MG tablet Take 4 mg by mouth daily with breakfast.     . labetalol (NORMODYNE) 100 MG  tablet TK 1 T PO  BID    . labetalol (NORMODYNE) 200 MG tablet Take 200 mg by mouth 2 (two) times daily.     Marland Kitchen lisinopril (PRINIVIL,ZESTRIL) 20 MG tablet Take 20 mg by mouth daily.    . metFORMIN (GLUCOPHAGE) 1000 MG tablet Take 1,000 mg by mouth 2 (two) times daily with a meal.   6   Current Facility-Administered Medications  Medication Dose Route Frequency Provider Last Rate Last Dose  . aflibercept (EYLEA) SOLN 2 mg  2 mg Intravitreal  Bernarda Caffey, MD   2 mg at 12/05/17 1138  . aflibercept (EYLEA) SOLN 2 mg  2 mg Intravitreal  Bernarda Caffey, MD   2 mg at 01/04/18 1202  . aflibercept (EYLEA) SOLN 2 mg  2 mg Intravitreal  Bernarda Caffey, MD   2 mg at 01/04/18 1203  . aflibercept (EYLEA) SOLN 2 mg  2 mg Intravitreal  Bernarda Caffey, MD   2 mg at 02/05/18 0930  . aflibercept (EYLEA) SOLN 2 mg  2 mg Intravitreal  Bernarda Caffey, MD   2 mg at 02/05/18 0930  . aflibercept (EYLEA) SOLN 2 mg  2 mg Intravitreal  Bernarda Caffey, MD   2 mg at 03/05/18  1649  . aflibercept (EYLEA) SOLN 2 mg  2 mg Intravitreal  Bernarda Caffey, MD   2 mg at 03/05/18 1649  . aflibercept (EYLEA) SOLN 2 mg  2 mg Intravitreal  Bernarda Caffey, MD   2 mg at 04/05/18 2315  . aflibercept (EYLEA) SOLN 2 mg  2 mg Intravitreal  Bernarda Caffey, MD   2 mg at 04/05/18 2315  . aflibercept (EYLEA) SOLN 2 mg  2 mg Intravitreal  Bernarda Caffey, MD   2 mg at 05/13/18 2254  . aflibercept (EYLEA) SOLN 2 mg  2 mg Intravitreal  Bernarda Caffey, MD   2 mg at 05/13/18 2255  . Bevacizumab (AVASTIN) SOLN 1.25 mg  1.25 mg Intravitreal  Bernarda Caffey, MD   1.25 mg at 09/06/17 1312  . Bevacizumab (AVASTIN) SOLN 1.25 mg  1.25 mg Intravitreal  Bernarda Caffey, MD   1.25 mg at 09/08/17 0933  . Bevacizumab (AVASTIN) SOLN 1.25 mg  1.25 mg Intravitreal  Bernarda Caffey, MD   1.25 mg at 10/09/17 1144  . Bevacizumab (AVASTIN) SOLN 1.25 mg  1.25 mg Intravitreal  Bernarda Caffey, MD   1.25 mg at 10/09/17 1144  . Bevacizumab (AVASTIN) SOLN 1.25 mg  1.25 mg Intravitreal  Bernarda Caffey, MD   1.25 mg at 11/06/17 1105  . Bevacizumab (AVASTIN) SOLN 1.25 mg  1.25 mg Intravitreal  Bernarda Caffey, MD   1.25 mg at 11/06/17 1105  . Bevacizumab (AVASTIN) SOLN 1.25 mg  1.25 mg Intravitreal  Bernarda Caffey, MD   1.25 mg at 12/05/17 1138    REVIEW OF SYSTEMS:  [X]  denotes positive finding, [ ]  denotes negative finding Cardiac  Comments:  Chest pain or chest pressure:    Shortness of breath upon exertion:    Short of breath when lying flat:    Irregular heart rhythm:        Vascular    Pain in calf, thigh, or hip brought on by ambulation:    Pain in feet at night that wakes you up from your sleep:     Blood clot in your veins:    Leg swelling:         Pulmonary    Oxygen at home:    Productive cough:     Wheezing:  Neurologic    Sudden weakness in arms or legs:     Sudden numbness in arms or legs:     Sudden onset of difficulty speaking or slurred speech:    Temporary loss of vision in one eye:     Problems  with dizziness:         Gastrointestinal    Blood in stool:     Vomited blood:         Genitourinary    Burning when urinating:     Blood in urine:        Psychiatric    Major depression:         Hematologic    Bleeding problems:    Problems with blood clotting too easily:        Skin    Rashes or ulcers:        Constitutional    Fever or chills:      PHYSICAL EXAM: Vitals:   09/25/18 1239 09/25/18 1244  BP: (!) 200/98 (!) 188/99  Pulse: 64 69  Resp: 18   Temp: 98.4 F (36.9 C)   TempSrc: Temporal   SpO2: 100%   Weight: 192 lb (87.1 kg)   Height: 5\' 10"  (1.778 m)     GENERAL: The patient is a well-nourished male, in no acute distress. The vital signs are documented above. CARDIAC: There is a regular rate and rhythm.  VASCULAR:  2+ palpable radial and brachial pulses in bilateral upper extremities PULMONARY: There is good air exchange bilaterally without wheezing or rales. ABDOMEN: Soft and non-tender with normal pitched bowel sounds.  MUSCULOSKELETAL: There are no major deformities or cyanosis. NEUROLOGIC: No focal weakness or paresthesias are detected. SKIN: There are no ulcers or rashes noted. PSYCHIATRIC: The patient has a normal affect.  DATA:   I reviewed his upper extremity arterial duplex and triphasic waveforms in both upper extremities  On vein mapping, it appears the cephalic vein was difficult to visualize in the left upper arm.  He does have a nice left basilic vein at over 3 mm.  Assessment/Plan:  Very pleasant 48 year old male with stage IV chronic kidney disease the presents for new dialysis access.  Given that he is right-handed, I recommended left upper arm access.  Based on vein mapping appears that the lab had difficulty visualizing his left upper arm cephalic vein and his basilicvein may be the best option.  We discussed typically this is done in two stages.  I will reevaluate his arm veins myself with ultrasound in the OR just to ensure  that the cephalic vein is not usable.  We will schedule next available date.   Marty Heck, MD [facility] Office: [phone] Pager: [phone]

## 2018-09-27 ENCOUNTER — Other Ambulatory Visit: Payer: Self-pay

## 2018-09-27 ENCOUNTER — Encounter (HOSPITAL_COMMUNITY): Payer: Self-pay | Admitting: *Deleted

## 2018-09-27 NOTE — Progress Notes (Signed)
Anesthesia Chart Review:  Pt is a same day work up   Case: [id number] Date/Time: 09/28/18 1009   Procedure: ARTERIOVENOUS (AV) FISTULA CREATION (Left )   Anesthesia type: Monitor Anesthesia Care   Pre-op diagnosis: CHRONIC KIDNEY DISEASE STAGE 5   Location: [facility] OR ROOM 73 / [place] [facility]   Surgeon: Marty Heck, MD      DISCUSSION:  - Pt is a 48 year old male with history HTN, DM, CKD stage IV  BP appears to be poorly controlled. At office visit 09/25/18 with Dr. Carlis Abbott, BP was 188/99 and 200/98 on recheck.    PROVIDERS: - PCP is Lillard Anes, MD  - Cardiologist is Jyl Heinz, MD who saw pt for DOE, HTN on 05/04/18. Echo and stress test ordered, are unremarkable (results below).    LABS: Will be obtained day of surgery    EKG 05/04/18: NSR.    CV:  Echo 06/06/18 1. The left ventricle has low normal systolic function, with an ejection fraction of 50-55%. The cavity size was normal. There is concentric left ventricular hypertrophy. Left ventricular diastolic Doppler parameters are consistent with impaired  relaxation. 2. The right ventricle has normal systolic function. The cavity was normal. There is no increase in right ventricular wall thickness. 3. Left atrial size was moderately dilated. 4. The mitral valve is normal in structure. 5. The tricuspid valve is normal in structure. 6. The aortic valve is tricuspid. 7. The pulmonic valve was normal in structure.  Nuclear stress test 06/05/18:   The left ventricular ejection fraction is moderately decreased (30-44%).  Nuclear stress EF: 33%.  Blood pressure demonstrated a hypotensive response to exercise.  There was no ST segment deviation noted during stress.  This is a low risk study.  No evidence of ischemia or MI.  Cardiomegaly with diminished LVEF.  No segmental wall motion abnormalities noted.    Past Medical History:  Diagnosis Date  . Cataract    OU  . Chronic kidney disease 05/04/2018  .  Diabetes 1.5, managed as type 2 ([place])   . Diabetic retinopathy ([place])    NPDR OU  . Hypertension   . Hypertensive retinopathy    OU    Past Surgical History:  Procedure Laterality Date  . MULTIPLE TOOTH EXTRACTIONS      MEDICATIONS: . aflibercept (EYLEA) SOLN 2 mg  . aflibercept (EYLEA) SOLN 2 mg  . aflibercept (EYLEA) SOLN 2 mg  . aflibercept (EYLEA) SOLN 2 mg  . aflibercept (EYLEA) SOLN 2 mg  . aflibercept (EYLEA) SOLN 2 mg  . aflibercept (EYLEA) SOLN 2 mg  . aflibercept (EYLEA) SOLN 2 mg  . aflibercept (EYLEA) SOLN 2 mg  . aflibercept (EYLEA) SOLN 2 mg  . aflibercept (EYLEA) SOLN 2 mg  . Bevacizumab (AVASTIN) SOLN 1.25 mg  . Bevacizumab (AVASTIN) SOLN 1.25 mg  . Bevacizumab (AVASTIN) SOLN 1.25 mg  . Bevacizumab (AVASTIN) SOLN 1.25 mg  . Bevacizumab (AVASTIN) SOLN 1.25 mg  . Bevacizumab (AVASTIN) SOLN 1.25 mg  . Bevacizumab (AVASTIN) SOLN 1.25 mg   . atorvastatin (LIPITOR) 40 MG tablet  . cloNIDine (CATAPRES) 0.1 MG tablet  . furosemide (LASIX) 40 MG tablet  . glimepiride (AMARYL) 4 MG tablet  . labetalol (NORMODYNE) 200 MG tablet  . lisinopril (PRINIVIL,ZESTRIL) 20 MG tablet  . metFORMIN (GLUCOPHAGE) 1000 MG tablet  . Multiple Vitamin (MULTIVITAMIN WITH MINERALS) TABS tablet  . ACCU-CHEK GUIDE test strip    If labs and BP acceptable day of surgery, I anticipate pt  can proceed with surgery as scheduled.  Willeen Cass, FNP-BC [facility]/Anesthesiology Phone: [phone] 09/27/2018 1:36 PM

## 2018-09-27 NOTE — Anesthesia Preprocedure Evaluation (Addendum)
Anesthesia Evaluation  Patient identified by MRN, date of birth, ID band Patient awake    Reviewed: Allergy & Precautions, NPO status , Patient's Chart, lab work & pertinent test results  Airway Mallampati: II  TM Distance: >3 FB Neck ROM: Full    Dental no notable dental hx. (+) Dental Advisory Given   Pulmonary neg pulmonary ROS, former smoker,    Pulmonary exam normal        Cardiovascular hypertension, Normal cardiovascular exam  Study Highlights   The left ventricular ejection fraction is moderately decreased (30-44%).  Nuclear stress EF: 33%.  Blood pressure demonstrated a hypotensive response to exercise.  There was no ST segment deviation noted during stress.  This is a low risk study.  No evidence of ischemia or MI.  Cardiomegaly with diminished LVEF.  No segmental wall motion abnormalities noted.       Neuro/Psych negative neurological ROS  negative psych ROS   GI/Hepatic negative GI ROS, Neg liver ROS,   Endo/Other  diabetes  Renal/GU CRF, ESRF and DialysisRenal disease  negative genitourinary   Musculoskeletal negative musculoskeletal ROS (+)   Abdominal   Peds negative pediatric ROS (+)  Hematology negative hematology ROS (+)   Anesthesia Other Findings   Reproductive/Obstetrics negative OB ROS                            Anesthesia Physical Anesthesia Plan  ASA: III  Anesthesia Plan: MAC   Post-op Pain Management:    Induction:   PONV Risk Score and Plan: Ondansetron and Propofol infusion  Airway Management Planned: Natural Airway  Additional Equipment:   Intra-op Plan:   Post-operative Plan:   Informed Consent: I have reviewed the patients History and Physical, chart, labs and discussed the procedure including the risks, benefits and alternatives for the proposed anesthesia with the patient or authorized representative who has indicated his/her  understanding and acceptance.     Dental advisory given  Plan Discussed with: CRNA and Anesthesiologist  Anesthesia Plan Comments: (See APP note by Durel Salts, FNP. BP appears to be poorly controlled. )       Anesthesia Quick Evaluation

## 2018-09-27 NOTE — Progress Notes (Signed)
Pre-op instructions given to wife Philip Wilson with patients premission Patient checks his sugar daily and usually runs in the 70s, diabetic instructions given to wife  7 days prior to surgery STOP taking any Aspirin (unless otherwise instructed by your surgeon), Aleve, Naproxen, Ibuprofen, Motrin, Advil, Goody's, BC's, all herbal medications, fish oil, and all vitamins.  Sending to anesthesia to be reviewed   Cardiologist Dr. Geraldo Pitter last visit 05/04/18' Has recent echo and stress 3/20   WHAT DO I DO ABOUT MY DIABETES MEDICATION?   Marland Kitchen Do not take oral diabetes medicines (pills) the morning of surgery. Metformin and Glimepride   How to Manage Your Diabetes Before and After Surgery  Why is it important to control my blood sugar before and after surgery? . Improving blood sugar levels before and after surgery helps healing and can limit problems. . A way of improving blood sugar control is eating a healthy diet by: o  Eating less sugar and carbohydrates o  Increasing activity/exercise o  Talking with your doctor about reaching your blood sugar goals . High blood sugars (greater than 180 mg/dL) can raise your risk of infections and slow your recovery, so you will need to focus on controlling your diabetes during the weeks before surgery. . Make sure that the doctor who takes care of your diabetes knows about your planned surgery including the date and location.  How do I manage my blood sugar before surgery? . Check your blood sugar at least 4 times a day, starting 2 days before surgery, to make sure that the level is not too high or low. o Check your blood sugar the morning of your surgery when you wake up and every 2 hours until you get to the Short Stay unit. . If your blood sugar is less than 70 mg/dL, you will need to treat for low blood sugar: o Do not take insulin. o Treat a low blood sugar (less than 70 mg/dL) with  cup of clear juice (cranberry or apple), 4 glucose tablets, OR glucose  gel. o Recheck blood sugar in 15 minutes after treatment (to make sure it is greater than 70 mg/dL). If your blood sugar is not greater than 70 mg/dL on recheck, call [phone] for further instructions. . Report your blood sugar to the short stay nurse when you get to Short Stay.  . If you are admitted to the hospital after surgery: o Your blood sugar will be checked by the staff and you will probably be given insulin after surgery (instead of oral diabetes medicines) to make sure you have good blood sugar levels. o The goal for blood sugar control after surgery is 80-180 mg/dL.

## 2018-09-28 ENCOUNTER — Encounter (HOSPITAL_COMMUNITY): Payer: Self-pay | Admitting: Surgery

## 2018-09-28 ENCOUNTER — Ambulatory Visit (HOSPITAL_COMMUNITY)
Admission: RE | Admit: 2018-09-28 | Discharge: 2018-09-28 | Disposition: A | Discharge status: Home / Self Care | Payer: BC Managed Care – PPO | Attending: Vascular Surgery | Admitting: Vascular Surgery

## 2018-09-28 ENCOUNTER — Encounter (HOSPITAL_COMMUNITY)
Admission: RE | Disposition: A | Discharge status: Home / Self Care | Payer: Self-pay | Source: Home / Self Care | Attending: Vascular Surgery

## 2018-09-28 ENCOUNTER — Ambulatory Visit (HOSPITAL_COMMUNITY): Payer: BC Managed Care – PPO | Admitting: Emergency Medicine

## 2018-09-28 DIAGNOSIS — Z79899 Other long term (current) drug therapy: Secondary | ICD-10-CM | POA: Insufficient documentation

## 2018-09-28 DIAGNOSIS — E133293 Other specified diabetes mellitus with mild nonproliferative diabetic retinopathy without macular edema, bilateral: Secondary | ICD-10-CM | POA: Insufficient documentation

## 2018-09-28 DIAGNOSIS — Z87891 Personal history of nicotine dependence: Secondary | ICD-10-CM | POA: Diagnosis not present

## 2018-09-28 DIAGNOSIS — N185 Chronic kidney disease, stage 5: Secondary | ICD-10-CM | POA: Insufficient documentation

## 2018-09-28 DIAGNOSIS — N184 Chronic kidney disease, stage 4 (severe): Secondary | ICD-10-CM

## 2018-09-28 DIAGNOSIS — Z7984 Long term (current) use of oral hypoglycemic drugs: Secondary | ICD-10-CM | POA: Insufficient documentation

## 2018-09-28 DIAGNOSIS — E1336 Other specified diabetes mellitus with diabetic cataract: Secondary | ICD-10-CM | POA: Diagnosis not present

## 2018-09-28 DIAGNOSIS — I120 Hypertensive chronic kidney disease with stage 5 chronic kidney disease or end stage renal disease: Secondary | ICD-10-CM | POA: Insufficient documentation

## 2018-09-28 DIAGNOSIS — H269 Unspecified cataract: Secondary | ICD-10-CM | POA: Insufficient documentation

## 2018-09-28 DIAGNOSIS — E1322 Other specified diabetes mellitus with diabetic chronic kidney disease: Secondary | ICD-10-CM | POA: Insufficient documentation

## 2018-09-28 DIAGNOSIS — H35033 Hypertensive retinopathy, bilateral: Secondary | ICD-10-CM | POA: Insufficient documentation

## 2018-09-28 HISTORY — PX: AV FISTULA PLACEMENT: SHX1204

## 2018-09-28 LAB — GLUCOSE, CAPILLARY
Glucose-Capillary: 76 mg/dL (ref 70–99)
Glucose-Capillary: 60 mg/dL — ABNORMAL LOW (ref 70–99)
Glucose-Capillary: 107 mg/dL — ABNORMAL HIGH (ref 70–99)
Glucose-Capillary: 98 mg/dL (ref 70–99)

## 2018-09-28 LAB — POCT I-STAT 4, (NA,K, GLUC, HGB,HCT)
Glucose, Bld: 71 mg/dL (ref 70–99)
HCT: 30 % — ABNORMAL LOW (ref 39.0–52.0)
Hemoglobin: 10.2 g/dL — ABNORMAL LOW (ref 13.0–17.0)
Potassium: 3.1 mmol/L — ABNORMAL LOW (ref 3.5–5.1)
Sodium: 140 mmol/L (ref 135–145)

## 2018-09-28 SURGERY — ARTERIOVENOUS (AV) FISTULA CREATION
Anesthesia: Monitor Anesthesia Care | Site: Arm Lower | Laterality: Left

## 2018-09-28 MED ORDER — SODIUM CHLORIDE 0.9 % IV SOLN
INTRAVENOUS | Status: AC
  Filled 2018-09-28: qty 1.2

## 2018-09-28 MED ORDER — ONDANSETRON HCL 4 MG/2ML IJ SOLN
INTRAMUSCULAR | Status: DC | PRN
  Administered 2018-09-28: 4 mg via INTRAVENOUS

## 2018-09-28 MED ORDER — 0.9 % SODIUM CHLORIDE (POUR BTL) OPTIME
TOPICAL | Status: DC | PRN
  Administered 2018-09-28: 1000 mL

## 2018-09-28 MED ORDER — DEXTROSE 50 % IV SOLN
25.0000 mL | Freq: Once | INTRAVENOUS | Status: AC
  Administered 2018-09-28: 25 mL via INTRAVENOUS
  Filled 2018-09-28: qty 50

## 2018-09-28 MED ORDER — ONDANSETRON HCL 4 MG/2ML IJ SOLN
INTRAMUSCULAR | Status: AC
  Filled 2018-09-28: qty 2

## 2018-09-28 MED ORDER — DEXTROSE 50 % IV SOLN
INTRAVENOUS | Status: AC
  Administered 2018-09-28: 10:00:00 25 mL via INTRAVENOUS
  Filled 2018-09-28: qty 50

## 2018-09-28 MED ORDER — LISINOPRIL 20 MG PO TABS
20.0000 mg | ORAL_TABLET | Freq: Once | ORAL | Status: AC
  Administered 2018-09-28: 20 mg via ORAL
  Filled 2018-09-28: qty 1
  Filled 2018-09-28: qty 2

## 2018-09-28 MED ORDER — CEFAZOLIN SODIUM-DEXTROSE 2-4 GM/100ML-% IV SOLN
2.0000 g | INTRAVENOUS | Status: AC
  Administered 2018-09-28: 2 g via INTRAVENOUS

## 2018-09-28 MED ORDER — LIDOCAINE 2% (20 MG/ML) 5 ML SYRINGE
INTRAMUSCULAR | Status: DC | PRN
  Administered 2018-09-28: 60 mg via INTRAVENOUS

## 2018-09-28 MED ORDER — FENTANYL CITRATE (PF) 250 MCG/5ML IJ SOLN
INTRAMUSCULAR | Status: AC
  Filled 2018-09-28: qty 5

## 2018-09-28 MED ORDER — SODIUM CHLORIDE 0.9 % IV SOLN
INTRAVENOUS | Status: DC
  Administered 2018-09-28: 09:00:00 via INTRAVENOUS

## 2018-09-28 MED ORDER — LIDOCAINE 2% (20 MG/ML) 5 ML SYRINGE
INTRAMUSCULAR | Status: AC
  Filled 2018-09-28: qty 5

## 2018-09-28 MED ORDER — LIDOCAINE HCL (PF) 1 % IJ SOLN
INTRAMUSCULAR | Status: AC
  Filled 2018-09-28: qty 30

## 2018-09-28 MED ORDER — HYDROCODONE-ACETAMINOPHEN 5-325 MG PO TABS: 1.0000 | ORAL_TABLET | ORAL | 0 refills | Status: DC | PRN

## 2018-09-28 MED ORDER — MIDAZOLAM HCL 2 MG/2ML IJ SOLN
INTRAMUSCULAR | Status: AC
  Filled 2018-09-28: qty 2

## 2018-09-28 MED ORDER — PAPAVERINE HCL 30 MG/ML IJ SOLN
INTRAMUSCULAR | Status: DC | PRN
  Administered 2018-09-28: 60 mg via INTRAVENOUS

## 2018-09-28 MED ORDER — HYDROCODONE-ACETAMINOPHEN 5-325 MG PO TABS
ORAL_TABLET | ORAL | Status: AC
  Filled 2018-09-28: qty 1

## 2018-09-28 MED ORDER — PROPOFOL 10 MG/ML IV BOLUS
INTRAVENOUS | Status: DC | PRN
  Administered 2018-09-28: 25 mg via INTRAVENOUS
  Administered 2018-09-28: 30 mg via INTRAVENOUS

## 2018-09-28 MED ORDER — HEPARIN SODIUM (PORCINE) 1000 UNIT/ML IJ SOLN
INTRAMUSCULAR | Status: DC | PRN
  Administered 2018-09-28: 3000 [IU] via INTRAVENOUS

## 2018-09-28 MED ORDER — HEPARIN SODIUM (PORCINE) 1000 UNIT/ML IJ SOLN
INTRAMUSCULAR | Status: AC
  Filled 2018-09-28: qty 1

## 2018-09-28 MED ORDER — LIDOCAINE HCL 1 % IJ SOLN
INTRAMUSCULAR | Status: DC | PRN
  Administered 2018-09-28: 30 mL

## 2018-09-28 MED ORDER — PROPOFOL 500 MG/50ML IV EMUL
INTRAVENOUS | Status: DC | PRN
  Administered 2018-09-28: 50 ug/kg/min via INTRAVENOUS

## 2018-09-28 MED ORDER — PAPAVERINE HCL 30 MG/ML IJ SOLN
INTRAMUSCULAR | Status: AC
  Filled 2018-09-28: qty 2

## 2018-09-28 MED ORDER — LIDOCAINE-EPINEPHRINE (PF) 1 %-1:200000 IJ SOLN
INTRAMUSCULAR | Status: AC
  Filled 2018-09-28: qty 30

## 2018-09-28 MED ORDER — PROPOFOL 10 MG/ML IV BOLUS
INTRAVENOUS | Status: AC
  Filled 2018-09-28: qty 20

## 2018-09-28 MED ORDER — HYDROCODONE-ACETAMINOPHEN 5-325 MG PO TABS
1.0000 | ORAL_TABLET | ORAL | Status: DC | PRN
  Administered 2018-09-28: 1 via ORAL

## 2018-09-28 MED ORDER — MIDAZOLAM HCL 5 MG/5ML IJ SOLN
INTRAMUSCULAR | Status: DC | PRN
  Administered 2018-09-28: 2 mg via INTRAVENOUS

## 2018-09-28 MED ORDER — ACETAMINOPHEN 500 MG PO TABS: 1000.0000 mg | ORAL_TABLET | Freq: Once | ORAL | Status: DC

## 2018-09-28 MED ORDER — SODIUM CHLORIDE 0.9 % IV SOLN
INTRAVENOUS | Status: DC | PRN
  Administered 2018-09-28: 500 mL

## 2018-09-28 MED ORDER — FENTANYL CITRATE (PF) 100 MCG/2ML IJ SOLN
INTRAMUSCULAR | Status: DC | PRN
  Administered 2018-09-28: 50 ug via INTRAVENOUS

## 2018-09-28 SURGICAL SUPPLY — 40 items
ARMBAND PINK RESTRICT EXTREMIT (MISCELLANEOUS) ×6 IMPLANT
CANISTER SUCT 3000ML PPV (MISCELLANEOUS) ×3 IMPLANT
CLIP VESOCCLUDE MED 6/CT (CLIP) ×3 IMPLANT
CLIP VESOCCLUDE SM WIDE 6/CT (CLIP) ×5 IMPLANT
COVER PROBE W GEL 5X96 (DRAPES) ×3 IMPLANT
COVER WAND RF STERILE (DRAPES) ×1 IMPLANT
DECANTER SPIKE VIAL GLASS SM (MISCELLANEOUS) ×3 IMPLANT
DERMABOND ADVANCED (GAUZE/BANDAGES/DRESSINGS) ×2
DERMABOND ADVANCED .7 DNX12 (GAUZE/BANDAGES/DRESSINGS) ×1 IMPLANT
ELECT REM PT RETURN 9FT ADLT (ELECTROSURGICAL) ×3
ELECTRODE REM PT RTRN 9FT ADLT (ELECTROSURGICAL) ×1 IMPLANT
GLOVE BIO SURGEON STRL SZ 6.5 (GLOVE) ×3 IMPLANT
GLOVE BIO SURGEON STRL SZ7.5 (GLOVE) ×3 IMPLANT
GLOVE BIO SURGEONS STRL SZ 6.5 (GLOVE) ×3
GLOVE BIOGEL PI IND STRL 6.5 (GLOVE) IMPLANT
GLOVE BIOGEL PI IND STRL 7.0 (GLOVE) IMPLANT
GLOVE BIOGEL PI IND STRL 8 (GLOVE) ×1 IMPLANT
GLOVE BIOGEL PI INDICATOR 6.5 (GLOVE) ×6
GLOVE BIOGEL PI INDICATOR 7.0 (GLOVE) ×2
GLOVE BIOGEL PI INDICATOR 8 (GLOVE) ×2
GOWN STRL REUS W/ TWL LRG LVL3 (GOWN DISPOSABLE) ×2 IMPLANT
GOWN STRL REUS W/ TWL XL LVL3 (GOWN DISPOSABLE) ×2 IMPLANT
GOWN STRL REUS W/TWL LRG LVL3 (GOWN DISPOSABLE) ×4
GOWN STRL REUS W/TWL XL LVL3 (GOWN DISPOSABLE) ×4
HEMOSTAT SPONGE AVITENE ULTRA (HEMOSTASIS) IMPLANT
KIT BASIN OR (CUSTOM PROCEDURE TRAY) ×3 IMPLANT
KIT TURNOVER KIT B (KITS) ×3 IMPLANT
NS IRRIG 1000ML POUR BTL (IV SOLUTION) ×3 IMPLANT
PACK CV ACCESS (CUSTOM PROCEDURE TRAY) ×3 IMPLANT
PAD ARMBOARD 7.5X6 YLW CONV (MISCELLANEOUS) ×6 IMPLANT
SUT MNCRL AB 4-0 PS2 18 (SUTURE) ×3 IMPLANT
SUT PROLENE 6 0 BV (SUTURE) ×5 IMPLANT
SUT PROLENE 7 0 BV 1 (SUTURE) IMPLANT
SUT VIC AB 3-0 SH 27 (SUTURE) ×2
SUT VIC AB 3-0 SH 27X BRD (SUTURE) ×1 IMPLANT
SYR 20CC LL (SYRINGE) ×2 IMPLANT
SYRINGE 3CC LL L/F (MISCELLANEOUS) ×2 IMPLANT
TOWEL GREEN STERILE (TOWEL DISPOSABLE) ×3 IMPLANT
UNDERPAD 30X30 (UNDERPADS AND DIAPERS) ×3 IMPLANT
WATER STERILE IRR 1000ML POUR (IV SOLUTION) ×3 IMPLANT

## 2018-09-28 NOTE — H&P (Signed)
History and Physical Interval Note:  09/28/2018 9:55 AM  Philip Wilson  has presented today for surgery, with the diagnosis of CHRONIC KIDNEY DISEASE STAGE 5.  The various methods of treatment have been discussed with the patient and family. After consideration of risks, benefits and other options for treatment, the patient has consented to  Procedure(s): ARTERIOVENOUS (AV) FISTULA CREATION (Left) as a surgical intervention.  The patient's history has been reviewed, patient examined, no change in status, stable for surgery.  I have reviewed the patient's chart and labs.  Questions were answered to the patient's satisfaction.    Left arm AVF vs graft  Marty Heck  Patient name: Philip Wilson   MRN: [id number]        DOB: Mar 23, 1971          Sex: male  REASON FOR CONSULT: New AVF access  HPI: Philip Wilson is a 48 y.o. male with history of stage IV chronic kidney disease, hypertension, and diabetes that presents for new dialysis access evaluation.  Patient states he is right-handed.  He has never had any previous dialysis access.  No catheters or chest wall implants.  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  Has not started dialysis at this time.  Kidney disease is related to diabetes and HTN.  No weakness, numbness, tissue loss in either hand.      Past Medical History:  Diagnosis Date  . Cataract    OU  . Chronic kidney disease 05/04/2018  . Diabetes 1.5, managed as type 2 ([place])   . Diabetic retinopathy ([place])    NPDR OU  . Hypertension   . Hypertensive retinopathy    OU    History reviewed. No pertinent surgical history.       Family History  Problem Relation Age of Onset  . Heart failure Mother     SOCIAL HISTORY: Social History        Socioeconomic History  . Marital status: Married    Spouse name: Not on file  . Number of children: Not on file  . Years of education: Not on file  . Highest education level: Not on  file  Occupational History  . Occupation: Geographical information systems officer  . Financial resource strain: Not on file  . Food insecurity    Worry: Not on file    Inability: Not on file  . Transportation needs    Medical: Not on file    Non-medical: Not on file  Tobacco Use  . Smoking status: Former Research scientist (life sciences)  . Smokeless tobacco: Never Used  Substance and Sexual Activity  . Alcohol use: Yes    Alcohol/week: 1.0 standard drinks    Types: 1 Cans of beer per week  . Drug use: Never  . Sexual activity: Not on file  Lifestyle  . Physical activity    Days per week: Not on file    Minutes per session: Not on file  . Stress: Not on file  Relationships  . Social Herbalist on phone: Not on file    Gets together: Not on file    Attends religious service: Not on file    Active member of club or organization: Not on file    Attends meetings of clubs or organizations: Not on file    Relationship status: Not on file  . Intimate partner violence    Fear of current or ex partner: Not on file    Emotionally abused: Not on file  Physically abused: Not on file    Forced sexual activity: Not on file  Other Topics Concern  . Not on file  Social History Narrative  . Not on file    No Known Allergies        Current Outpatient Medications  Medication Sig Dispense Refill  . ACCU-CHEK GUIDE test strip every morning. Use to test fasting blood glucose  4  . atorvastatin (LIPITOR) 40 MG tablet Take 40 mg by mouth daily at 6 PM.     . cloNIDine (CATAPRES) 0.1 MG tablet Take 0.1 mg by mouth 2 (two) times daily.     . furosemide (LASIX) 40 MG tablet Take 40 mg by mouth daily.    Marland Kitchen glimepiride (AMARYL) 4 MG tablet Take 4 mg by mouth daily with breakfast.     . labetalol (NORMODYNE) 100 MG tablet TK 1 T PO  BID    . labetalol (NORMODYNE) 200 MG tablet Take 200 mg by mouth 2 (two) times daily.     Marland Kitchen lisinopril (PRINIVIL,ZESTRIL) 20 MG tablet Take 20  mg by mouth daily.    . metFORMIN (GLUCOPHAGE) 1000 MG tablet Take 1,000 mg by mouth 2 (two) times daily with a meal.   6            Current Facility-Administered Medications  Medication Dose Route Frequency Provider Last Rate Last Dose  . aflibercept (EYLEA) SOLN 2 mg  2 mg Intravitreal  Bernarda Caffey, MD   2 mg at 12/05/17 1138  . aflibercept (EYLEA) SOLN 2 mg  2 mg Intravitreal  Bernarda Caffey, MD   2 mg at 01/04/18 1202  . aflibercept (EYLEA) SOLN 2 mg  2 mg Intravitreal  Bernarda Caffey, MD   2 mg at 01/04/18 1203  . aflibercept (EYLEA) SOLN 2 mg  2 mg Intravitreal  Bernarda Caffey, MD   2 mg at 02/05/18 0930  . aflibercept (EYLEA) SOLN 2 mg  2 mg Intravitreal  Bernarda Caffey, MD   2 mg at 02/05/18 0930  . aflibercept (EYLEA) SOLN 2 mg  2 mg Intravitreal  Bernarda Caffey, MD   2 mg at 03/05/18 1649  . aflibercept (EYLEA) SOLN 2 mg  2 mg Intravitreal  Bernarda Caffey, MD   2 mg at 03/05/18 1649  . aflibercept (EYLEA) SOLN 2 mg  2 mg Intravitreal  Bernarda Caffey, MD   2 mg at 04/05/18 2315  . aflibercept (EYLEA) SOLN 2 mg  2 mg Intravitreal  Bernarda Caffey, MD   2 mg at 04/05/18 2315  . aflibercept (EYLEA) SOLN 2 mg  2 mg Intravitreal  Bernarda Caffey, MD   2 mg at 05/13/18 2254  . aflibercept (EYLEA) SOLN 2 mg  2 mg Intravitreal  Bernarda Caffey, MD   2 mg at 05/13/18 2255  . Bevacizumab (AVASTIN) SOLN 1.25 mg  1.25 mg Intravitreal  Bernarda Caffey, MD   1.25 mg at 09/06/17 1312  . Bevacizumab (AVASTIN) SOLN 1.25 mg  1.25 mg Intravitreal  Bernarda Caffey, MD   1.25 mg at 09/08/17 0933  . Bevacizumab (AVASTIN) SOLN 1.25 mg  1.25 mg Intravitreal  Bernarda Caffey, MD   1.25 mg at 10/09/17 1144  . Bevacizumab (AVASTIN) SOLN 1.25 mg  1.25 mg Intravitreal  Bernarda Caffey, MD   1.25 mg at 10/09/17 1144  . Bevacizumab (AVASTIN) SOLN 1.25 mg  1.25 mg Intravitreal  Bernarda Caffey, MD   1.25 mg at 11/06/17 1105  . Bevacizumab (AVASTIN) SOLN 1.25 mg  1.25 mg Intravitreal  Bernarda Caffey, MD   1.25 mg at  11/06/17 1105  . Bevacizumab (AVASTIN) SOLN 1.25 mg  1.25 mg Intravitreal  Bernarda Caffey, MD   1.25 mg at 12/05/17 1138    REVIEW OF SYSTEMS:  [X]  denotes positive finding, [ ]  denotes negative finding Cardiac  Comments:  Chest pain or chest pressure:    Shortness of breath upon exertion:    Short of breath when lying flat:    Irregular heart rhythm:        Vascular    Pain in calf, thigh, or hip brought on by ambulation:    Pain in feet at night that wakes you up from your sleep:     Blood clot in your veins:    Leg swelling:         Pulmonary    Oxygen at home:    Productive cough:     Wheezing:         Neurologic    Sudden weakness in arms or legs:     Sudden numbness in arms or legs:     Sudden onset of difficulty speaking or slurred speech:    Temporary loss of vision in one eye:     Problems with dizziness:         Gastrointestinal    Blood in stool:     Vomited blood:         Genitourinary    Burning when urinating:     Blood in urine:        Psychiatric    Major depression:         Hematologic    Bleeding problems:    Problems with blood clotting too easily:        Skin    Rashes or ulcers:        Constitutional    Fever or chills:      PHYSICAL EXAM:     Vitals:   09/25/18 1239 09/25/18 1244  BP: (!) 200/98 (!) 188/99  Pulse: 64 69  Resp: 18   Temp: 98.4 F (36.9 C)   TempSrc: Temporal   SpO2: 100%   Weight: 192 lb (87.1 kg)   Height: 5\' 10"  (1.778 m)     GENERAL: The patient is a well-nourished male, in no acute distress. The vital signs are documented above. CARDIAC: There is a regular rate and rhythm.  VASCULAR:  2+ palpable radial and brachial pulses in bilateral upper extremities PULMONARY: There is good air exchange bilaterally without wheezing or rales. ABDOMEN: Soft and non-tender with normal pitched bowel sounds.   MUSCULOSKELETAL: There are no major deformities or cyanosis. NEUROLOGIC: No focal weakness or paresthesias are detected. SKIN: There are no ulcers or rashes noted. PSYCHIATRIC: The patient has a normal affect.  DATA:   I reviewed his upper extremity arterial duplex and triphasic waveforms in both upper extremities  On vein mapping, it appears the cephalic vein was difficult to visualize in the left upper arm.  He does have a nice left basilic vein at over 3 mm.  Assessment/Plan:  Very pleasant 48 year old male with stage IV chronic kidney disease the presents for new dialysis access.  Given that he is right-handed, I recommended left upper arm access.  Based on vein mapping appears that the lab had difficulty visualizing his left upper arm cephalic vein and his basilicvein may be the best option.  We discussed typically this is done in two stages.  I will reevaluate his arm veins myself with  ultrasound in the OR just to ensure that the cephalic vein is not usable.  We will schedule next available date.   Marty Heck, MD [facility] Office: [phone] Pager: [phone]

## 2018-09-28 NOTE — Op Note (Signed)
OPERATIVE NOTE   PROCEDURE: left radiocephaic arteriovenous fistula placement  PRE-OPERATIVE DIAGNOSIS: Stage IV CKD  POST-OPERATIVE DIAGNOSIS: Same  SURGEON: Monica Martinez, MD  ASSISTANT(S): OR Staff  ANESTHESIA: MAC  ESTIMATED BLOOD LOSS: Minimal  FINDING(S): 1.  Cephalic vein: 2.5 mm, acceptable 2.  Radial artery: 2.5 mm, disease free 3.  Venous outflow: palpable thrill  4.  Radial flow: palpable radial pulse  SPECIMEN(S):  none  INDICATIONS:   Philip Wilson is a 48 y.o. male who presents with stage IV CKD and need for permanent AV fistula access.  I discussed the option of a wrist fistula given vein mapping.  The patient is aware the risks include but are not limited to: bleeding, infection, steal syndrome, nerve damage, ischemic monomelic neuropathy, failure to mature, and need for additional procedures.  The patient is aware of the risks of the procedure and elects to proceed forward.  DESCRIPTION: After full informed written consent was obtained from the patient, the patient was brought back to the operating room and placed supine upon the operating table.  Prior to induction, the patient received IV antibiotics.   After obtaining adequate anesthesia, the patient was then prepped and draped in the standard fashion for a left arm access procedure.   I turned my attention first to identifying the patient's distal cephalic vein and radial artery.  Using SonoSite guidance, the location of these vessels were marked out on the skin.   At this point, I injected local anesthetic to obtain a field block of the wrist.  In total, I injected about 6 mL of 1% lidocaine without epinephrine.  I made a longitudinal incision at the level of the wrist and dissected through the subcutaneous tissue and fascia to gain exposure of the radial artery.  This was noted to be 2.5 mm in diameter externally.  This was dissected out proximally and distally and controlled with vessel loops .   I then dissected out the cephalic vein.  This was noted to be 2.5 mm in diameter externally.  The distal segment of the vein was ligated with a  2-0 silk, and the vein was transected.  The proximal segment was interrogated with serial dilators.  The vein accepted up to a 3.5 mm dilator without any difficulty.  I then instilled the heparinized saline into the vein and clamped it.  At this point, I reset my exposure of the radial artery and placed the artery under tension proximally and distally.  I made an arteriotomy with a #11 blade, and then I extended the arteriotomy with a Potts scissor.  I injected heparinized saline proximal and distal to this arteriotomy.  The vein was then sewn to the artery in an end-to-side configuration with a running stitch of 6-0 Prolene.  Prior to completing this anastomosis, I allowed the vein and artery to backbleed.  There was no evidence of clot from any vessels.  I completed the anastomosis in the usual fashion and then released all vessel loops and clamps.    There was a palpable thrill in the venous outflow, and there was a palpable radial pulse.  At this point, I irrigated out the surgical wound.  There was no further active bleeding.  The subcutaneous tissue was reapproximated with a running stitch of 3-0 Vicryl.  The skin was then reapproximated with a running subcuticular stitch of 4-0 Monocryl.  The skin was then cleaned, dried, and reinforced with Dermabond.  The patient tolerated this procedure well.  COMPLICATIONS: None  CONDITION: Stable   Monica Martinez, MD [facility] Office: [phone] Pager: [phone]  09/28/2018, 11:31 AM

## 2018-09-28 NOTE — Transfer of Care (Signed)
Immediate Anesthesia Transfer of Care Note  Patient: Philip Wilson  Procedure(s) Performed: Creation of Left arm Radiocephalic ARTERIOVENOUS (AV) FISTULA (Left Arm Lower)  Patient Location: PACU  Anesthesia Type:MAC  Level of Consciousness: awake, alert  and patient cooperative  Airway & Oxygen Therapy: Patient Spontanous Breathing  Post-op Assessment: Report given to RN and Post -op Vital signs reviewed and stable  Post vital signs: Reviewed and stable  Last Vitals:  Vitals Value Taken Time  BP 148/84 09/28/18 1142  Temp    Pulse 72 09/28/18 1143  Resp 14 09/28/18 1143  SpO2 97 % 09/28/18 1143  Vitals shown include unvalidated device data.  Last Pain:  Vitals:   09/28/18 0903  PainSc: 0-No pain      Patients Stated Pain Goal: 3 (75/44/92 0100)  Complications: No apparent anesthesia complications

## 2018-09-28 NOTE — Anesthesia Postprocedure Evaluation (Signed)
Anesthesia Post Note  Patient: Philip Wilson  Procedure(s) Performed: Creation of Left arm Radiocephalic ARTERIOVENOUS (AV) FISTULA (Left Arm Lower)     Patient location during evaluation: PACU Anesthesia Type: MAC Level of consciousness: awake and alert Pain management: pain level controlled Vital Signs Assessment: post-procedure vital signs reviewed and stable Respiratory status: spontaneous breathing, nonlabored ventilation and respiratory function stable Cardiovascular status: stable and blood pressure returned to baseline Anesthetic complications: no    Last Vitals:  Vitals:   09/28/18 1200 09/28/18 1213  BP: (!) 152/80 (!) 164/79  Pulse: 62 (!) 58  Resp: 14 16  Temp:    SpO2: 98% 99%    Last Pain:  Vitals:   09/28/18 1213  PainSc: 0-No pain                 Audry Pili

## 2018-09-28 NOTE — Anesthesia Procedure Notes (Signed)
Procedure Name: MAC Date/Time: 09/28/2018 10:21 AM Performed by: Renato Shin, CRNA Pre-anesthesia Checklist: Emergency Drugs available, Patient identified, Suction available and Patient being monitored Patient Re-evaluated:Patient Re-evaluated prior to induction Oxygen Delivery Method: Nasal cannula Preoxygenation: Pre-oxygenation with 100% oxygen Induction Type: IV induction Placement Confirmation: positive ETCO2 and breath sounds checked- equal and bilateral Dental Injury: Teeth and Oropharynx as per pre-operative assessment

## 2018-09-28 NOTE — Discharge Instructions (Signed)
[facility]  Discharge Instructions  AV Fistula or Graft Surgery for Dialysis Access  Please refer to the following instructions for your post-procedure care. Your surgeon or physician assistant will discuss any changes with you.  Activity  You may drive the day following your surgery, if you are comfortable and no longer taking prescription pain medication. Resume full activity as the soreness in your incision resolves.  Bathing/Showering  You may shower after you go home. Keep your incision dry for 48 hours. Do not soak in a bathtub, hot tub, or swim until the incision heals completely. You may not shower if you have a hemodialysis catheter.  Incision Care  Clean your incision with mild soap and water after 48 hours. Pat the area dry with a clean towel. You do not need a bandage unless otherwise instructed. Do not apply any ointments or creams to your incision. You may have skin glue on your incision. Do not peel it off. It will come off on its own in about one week. Your arm may swell a bit after surgery. To reduce swelling use pillows to elevate your arm so it is above your heart. Your doctor will tell you if you need to lightly wrap your arm with an ACE bandage.  Diet  Resume your normal diet. There are not special food restrictions following this procedure. In order to heal from your surgery, it is CRITICAL to get adequate nutrition. Your body requires vitamins, minerals, and protein. Vegetables are the best source of vitamins and minerals. Vegetables also provide the perfect balance of protein. Processed food has little nutritional value, so try to avoid this.  Medications  Resume taking all of your medications. If your incision is causing pain, you may take over-the counter pain relievers such as acetaminophen (Tylenol). If you were prescribed a stronger pain medication, please be aware these medications can cause nausea and constipation. Prevent  nausea by taking the medication with a snack or meal. Avoid constipation by drinking plenty of fluids and eating foods with high amount of fiber, such as fruits, vegetables, and grains.  Do not take Tylenol if you are taking prescription pain medications.  Follow up Your surgeon may want to see you in the office following your access surgery. If so, this will be arranged at the time of your surgery.  Please call us immediately for any of the following conditions:  Increased pain, redness, drainage (pus) from your incision site Fever of 101 degrees or higher Severe or worsening pain at your incision site Hand pain or numbness.  Reduce your risk of vascular disease:  Stop smoking. If you would like help, call [facility] at 1-800-QUIT-NOW 516-722-1132) or [place] at [place] your cholesterol Maintain a desired weight Control your diabetes Keep your blood pressure down  Dialysis  It will take several weeks to several months for your new dialysis access to be ready for use. Your surgeon will determine when it is okay to use it. Your nephrologist will continue to direct your dialysis. You can continue to use your Permcath until your new access is ready for use.   09/28/2018 Holston Oyama [id number] Sep 28, 1970  Surgeon(s): Marty Heck, MD  Procedure(s): Creation of Left arm Radiocephalic ARTERIOVENOUS (AV) FISTULA   May stick graft immediately   May stick graft on designated area only:   x Do not stick until after follow-up in clinic    If you have any questions, please call the office  at [phone].

## 2018-09-29 ENCOUNTER — Encounter (HOSPITAL_COMMUNITY): Payer: Self-pay | Admitting: Vascular Surgery

## 2018-10-25 NOTE — Progress Notes (Signed)
[facility] Note  10/29/2018     CHIEF COMPLAINT Patient presents for Retina Follow Up   HISTORY OF PRESENT ILLNESS: Philip Wilson is a 48 y.o. male who presents to the clinic today for:   HPI    Retina Follow Up    Patient presents with  Diabetic Retinopathy.  In both eyes.  This started weeks ago.  Severity is moderate.  Duration of weeks.  Since onset it is stable.  I, the attending physician,  performed the HPI with the patient and updated documentation appropriately.          Comments    Patient states his vision is about the same in both eyes.  Patient denies any eye pain or discomfort and denies any new or worsening floaters or fol OU.       Last edited by Bernarda Caffey, MD on 10/29/2018  8:17 AM. (History)    pt had AV fistula surgery on left wrist on 6.26.2020 -- doing well. Has begun the kidney transplant process at [facility].   Referring physician: Lillard Anes, MD [address] ,  [place] [address]  HISTORICAL INFORMATION:   Selected notes from the MEDICAL RECORD NUMBER Referred by Dr. Ricki Bartok for diabetic retinopathy OU LEE: 05.28.19 (A. Hager) [BCVA: OD: 20/40 OS: 20/50+2] Ocular Hx-Diabetic Retinopathy PMH-DM    CURRENT MEDICATIONS: No current outpatient medications on file. (Ophthalmic Drugs)   Current Facility-Administered Medications (Ophthalmic Drugs)  Medication Route  . aflibercept (EYLEA) SOLN 2 mg Intravitreal  . aflibercept (EYLEA) SOLN 2 mg Intravitreal  . aflibercept (EYLEA) SOLN 2 mg Intravitreal  . aflibercept (EYLEA) SOLN 2 mg Intravitreal  . aflibercept (EYLEA) SOLN 2 mg Intravitreal  . aflibercept (EYLEA) SOLN 2 mg Intravitreal  . aflibercept (EYLEA) SOLN 2 mg Intravitreal  . aflibercept (EYLEA) SOLN 2 mg Intravitreal  . aflibercept (EYLEA) SOLN 2 mg Intravitreal  . aflibercept (EYLEA) SOLN 2 mg Intravitreal  . aflibercept (EYLEA) SOLN 2 mg Intravitreal    Current Outpatient Medications (Other)  Medication Sig  . ACCU-CHEK GUIDE test strip every morning. Use to test fasting blood glucose  . atorvastatin (LIPITOR) 40 MG tablet Take 40 mg by mouth daily at 6 PM.   . cloNIDine (CATAPRES) 0.1 MG tablet Take 0.1 mg by mouth 2 (two) times daily.   . furosemide (LASIX) 40 MG tablet Take 40 mg by mouth daily.  Marland Kitchen glimepiride (AMARYL) 4 MG tablet Take 4 mg by mouth daily with breakfast.   . HYDROcodone-acetaminophen (NORCO/VICODIN) 5-325 MG tablet Take 1 tablet by mouth every 4 (four) hours as needed for moderate pain.  Marland Kitchen labetalol (NORMODYNE) 200 MG tablet Take 200 mg by mouth 3 (three) times daily.   Marland Kitchen lisinopril (PRINIVIL,ZESTRIL) 20 MG tablet Take 20 mg by mouth 2 (two) times a day.   . metFORMIN (GLUCOPHAGE) 1000 MG tablet Take 1,000 mg by mouth daily.   . Multiple Vitamin (MULTIVITAMIN WITH MINERALS) TABS tablet Take 1 tablet by mouth daily.   Current Facility-Administered Medications (Other)  Medication Route  . Bevacizumab (AVASTIN) SOLN 1.25 mg Intravitreal  . Bevacizumab (AVASTIN) SOLN 1.25 mg Intravitreal  . Bevacizumab (AVASTIN) SOLN 1.25 mg Intravitreal  . Bevacizumab (AVASTIN) SOLN 1.25 mg Intravitreal  . Bevacizumab (AVASTIN) SOLN 1.25 mg Intravitreal  . Bevacizumab (AVASTIN) SOLN 1.25 mg Intravitreal  . Bevacizumab (AVASTIN) SOLN 1.25 mg Intravitreal      REVIEW OF SYSTEMS: ROS    Positive for:  Genitourinary, Endocrine, Cardiovascular, Eyes   Negative for: Constitutional, Gastrointestinal, Neurological, Skin, Musculoskeletal, HENT, Respiratory, Psychiatric, Allergic/Imm, Heme/Lymph   Last edited by Doneen Poisson on 10/29/2018  8:07 AM. (History)       ALLERGIES No Known Allergies  PAST MEDICAL HISTORY Past Medical History:  Diagnosis Date  . Cataract    OU  . Chronic kidney disease 05/04/2018  . Diabetes 1.5, managed as type 2 ([place])   . Diabetic retinopathy ([place])    NPDR OU  . Hypertension   . Hypertensive  retinopathy    OU   Past Surgical History:  Procedure Laterality Date  . AV FISTULA PLACEMENT Left 09/28/2018   Procedure: Creation of Left arm Radiocephalic ARTERIOVENOUS (AV) FISTULA;  Surgeon: Marty Heck, MD;  Location: [facility];  Service: Vascular;  Laterality: Left;  Marland Kitchen MULTIPLE TOOTH EXTRACTIONS      FAMILY HISTORY Family History  Problem Relation Age of Onset  . Heart failure Mother     SOCIAL HISTORY Social History   Tobacco Use  . Smoking status: Former Research scientist (life sciences)  . Smokeless tobacco: Never Used  . Tobacco comment: quit 20 years ago  Substance Use Topics  . Alcohol use: Not Currently  . Drug use: Never         OPHTHALMIC EXAM:  Base Eye Exam    Visual Acuity (Snellen - Linear)      Right Left   Dist [place] 20/25 +1 20/25 -1   Dist ph [place] NI 20/20 -3       Tonometry (Tonopen, 8:09 AM)      Right Left   Pressure 18 16       Pupils      Dark Light Shape React APD   Right 3 2 Round Brisk 0   Left 3 2 Round Brisk 0       Visual Fields      Left Right    Full Full       Extraocular Movement      Right Left    Full Full       Neuro/Psych    Oriented x3: Yes   Mood/Affect: Normal       Dilation    Both eyes: 1.0% Mydriacyl, 2.5% Phenylephrine @ 8:09 AM        Slit Lamp and Fundus Exam    Slit Lamp Exam      Right Left   Lids/Lashes Dermatochalasis - upper lid, mild Meibomian gland dysfunction Dermatochalasis - upper lid, mild Meibomian gland dysfunction   Conjunctiva/Sclera White and quiet White and quiet   Cornea Clear Clear   Anterior Chamber Deep and quiet, no cell or flare Deep and quiet, no cell or flare   Iris Round and dilated to 90mm Round and dilated to 67mm   Lens 1+ Nuclear sclerosis, 2+ Cortical cataract 1+ Nuclear sclerosis, 2+ Cortical cataract   Vitreous Mild Vitreous syneresis Mild Vitreous syneresis, old white VH inferiorly,       Fundus Exam      Right Left   Disc Pink and Sharp Pink and Sharp   C/D Ratio 0.2 0.2    Macula good foveal reflex, mild cystic changes and MA with exudates essentially resolved, mild ERM Blunted foveal reflex, persistent central edema with exudates ST to fovea - improving, DBH/MA's improved   Vessels Tortuous, AV crossing changes, Vascular attenuation Tortuous, AV crossing changes   Periphery Attached, scattered MAs and DBH - mostly posterior, scattered CWS -- almost resolved Attached, scattered  MAs and DBH -- mostly posterior -- all improved          IMAGING AND PROCEDURES  Imaging and Procedures for @TODAY @  OCT, Retina - OU - Both Eyes       Right Eye Quality was good. Central Foveal Thickness: 315. Progression has worsened. Findings include intraretinal fluid, outer retinal atrophy, retinal drusen , intraretinal hyper-reflective material, no SRF, abnormal foveal contour (interval increase in ST IRF/DME).   Left Eye Quality was good. Central Foveal Thickness: 277. Progression has improved. Findings include intraretinal fluid, epiretinal membrane, no SRF, intraretinal hyper-reflective material, abnormal foveal contour (Interval improvement in IRF/DME).   Notes *Images captured and stored on drive  Diagnosis / Impression:  DME OU OD: interval increase in ST IRF/DME OS: Interval improvement IRF/DME  Clinical management:  See below  Abbreviations: NFP - Normal foveal profile. CME - cystoid macular edema. PED - pigment epithelial detachment. IRF - intraretinal fluid. SRF - subretinal fluid. EZ - ellipsoid zone. ERM - epiretinal membrane. ORA - outer retinal atrophy. ORT - outer retinal tubulation. SRHM - subretinal hyper-reflective material        Intravitreal Injection, Pharmacologic Agent - OD - Right Eye       Time Out 10/29/2018. 8:32 AM. Confirmed correct patient, procedure, site, and patient consented.   Anesthesia Topical anesthesia was used. Anesthetic medications included Lidocaine 2%, Proparacaine 0.5%.   Procedure Preparation included 5% betadine  to ocular surface, eyelid speculum. A 30 gauge needle was used.   Injection:  2 mg aflibercept Alfonse Flavors) SOLN   NDC: A3590391, Lot: [id number], Expiration date: 05/25/2018   Route: Intravitreal, Site: Right Eye, Waste: 0.05 mL  Post-op Post injection exam found visual acuity of at least counting fingers. The patient tolerated the procedure well. There were no complications. The patient received written and verbal post procedure care education. Post injection medications were not given.        Intravitreal Injection, Pharmacologic Agent - OS - Left Eye       Time Out 10/29/2018. 8:33 AM. Confirmed correct patient, procedure, site, and patient consented.   Anesthesia Topical anesthesia was used. Anesthetic medications included Lidocaine 2%, Proparacaine 0.5%.   Procedure Preparation included 5% betadine to ocular surface, eyelid speculum. A 30 gauge needle was used.   Injection:  2 mg aflibercept Alfonse Flavors) SOLN   NDC: A3590391, Lot: [id number], Expiration date: 05/25/2018   Route: Intravitreal, Site: Left Eye, Waste: 0.05 mL  Post-op Post injection exam found visual acuity of at least counting fingers. The patient tolerated the procedure well. There were no complications. The patient received written and verbal post procedure care education. Post injection medications were not given.                 ASSESSMENT/PLAN:    ICD-10-CM   1. Severe nonproliferative diabetic retinopathy of both eyes with macular edema associated with type 2 diabetes mellitus (HCC)  P50.9326 Intravitreal Injection, Pharmacologic Agent - OD - Right Eye    Intravitreal Injection, Pharmacologic Agent - OS - Left Eye    aflibercept (EYLEA) SOLN 2 mg    aflibercept (EYLEA) SOLN 2 mg  2. Retinal edema  H35.81 OCT, Retina - OU - Both Eyes  3. Essential hypertension  I10   4. Hypertensive retinopathy of both eyes  H35.033   5. Combined forms of age-related cataract of both eyes  H25.813     1, 2.  Severe Non-proliferative diabetic retinopathy, both eyes  - initial exam showed massive central DME  OU and scattered IRH and IRMA  - FA 6.5.19 with patches of capillary nonperfusion; extensive Mas with late leakage OU; no frank NV  - S/P IVA OS #1 (06.05.19), #2 (07.08.19), #3 (07.08.19), #4 (09.03.19)  - S/P IVA OD #1 (06.07.19), #2 (07.08.19), #3 (07.08.19)  - S/P IVE OD #1 (09.03.19), #2 (10.03.19), #3 (11.04.19), #4 (12.02.19), #5 (01.02.20), #6 (02.07.20), #7 (03.16.20), #8 (05.20.20), #9 (06.22.20)  - S/P IVE OS #1 (10.03.19), #2 (11.04.19), #3 (12.02.19), #4 (01.02.20), #5 (02.07.20), #6 (03.16.20), #7 (05.20.20), #8 (06.22.20)  - FA 01.02.20, shows improvement in late leaking microaneurysms OU  - OCT shows mild interval increase ST IRF/DME OD, mild interval improvement in IRF OS  - BCVA stable at 20/25 OD, 20/20 OS  - discussed findings and OCT  - recommend IVE OU today (07.27.2020) -- OD #10, OS #9  - pt wishes to proceed  - RBA of procedure discussed, questions answered  - informed consent obtained and signed  - see procedure note -- tolerated well  - Eylea paperwork and benefits investigation started on 08.05.19 -- approved w/ Commercial assistance program -- approved for 2020  - f/u in 5 weeks -- DFE/OCT/possible IVE  3,4. Hypertensive retinopathy OU  - discussed importance of tight BP control  - has had some Bps in hypertensive emergency range (200s / 110s) -- today pt reports significant improvement  - discussed likely contribution to DME  - pt reports new diagnosis of renal insufficiency/failure which is complicating his HTN  - monitor  - BP management per nephrology, PCP and cardiology   5. Combined form age-related cataract OU-   - The symptoms of cataract, surgical options, and treatments and risks were discussed with patient.  - discussed diagnosis and progression  - not yet visually significant  - monitor for now   Ophthalmic Meds Ordered this visit:  Meds  ordered this encounter  Medications  . aflibercept (EYLEA) SOLN 2 mg  . aflibercept (EYLEA) SOLN 2 mg       Return for 4-5 wks, Dilated Exam, OCT, Possible Injxn.  There are no Patient Instructions on file for this visit.   Explained the diagnoses, plan, and follow up with the patient and they expressed understanding.  Patient expressed understanding of the importance of proper follow up care.   This document serves as a record of services personally performed by Gardiner Sleeper, MD, PhD. It was created on their behalf by Ernest Mallick, OA, an ophthalmic assistant. The creation of this record is the provider's dictation and/or activities during the visit.    Electronically signed by: Ernest Mallick, OA 07.23.2020 9:57 PM    Gardiner Sleeper, M.D., Ph.D. Diseases & Surgery of the Retina and Vitreous [facility]  I have reviewed the above documentation for accuracy and completeness, and I agree with the above. Gardiner Sleeper, M.D., Ph.D. 10/30/18 9:59 PM    Abbreviations: M myopia (nearsighted); A astigmatism; H hyperopia (farsighted); P presbyopia; Mrx spectacle prescription;  CTL contact lenses; OD right eye; OS left eye; OU both eyes  XT exotropia; ET esotropia; PEK punctate epithelial keratitis; PEE punctate epithelial erosions; DES dry eye syndrome; MGD meibomian gland dysfunction; ATs artificial tears; PFAT's preservative free artificial tears; [place] nuclear sclerotic cataract; PSC posterior subcapsular cataract; ERM epi-retinal membrane; PVD posterior vitreous detachment; RD retinal detachment; DM diabetes mellitus; DR diabetic retinopathy; NPDR non-proliferative diabetic retinopathy; PDR proliferative diabetic retinopathy; CSME clinically significant macular edema; DME diabetic macular edema; dbh dot  blot hemorrhages; CWS cotton wool spot; POAG primary open angle glaucoma; C/D cup-to-disc ratio; HVF humphrey visual field; GVF goldmann visual field; OCT optical  coherence tomography; IOP intraocular pressure; BRVO Branch retinal vein occlusion; CRVO central retinal vein occlusion; CRAO central retinal artery occlusion; BRAO branch retinal artery occlusion; RT retinal tear; SB scleral buckle; PPV pars plana vitrectomy; VH Vitreous hemorrhage; PRP panretinal laser photocoagulation; IVK intravitreal kenalog; VMT vitreomacular traction; MH Macular hole;  NVD neovascularization of the disc; NVE neovascularization elsewhere; AREDS age related eye disease study; ARMD age related macular degeneration; POAG primary open angle glaucoma; EBMD epithelial/anterior basement membrane dystrophy; ACIOL anterior chamber intraocular lens; IOL intraocular lens; PCIOL posterior chamber intraocular lens; Phaco/IOL phacoemulsification with intraocular lens placement; [place] photorefractive keratectomy; LASIK laser assisted in situ keratomileusis; HTN hypertension; DM diabetes mellitus; COPD chronic obstructive pulmonary disease

## 2018-10-29 ENCOUNTER — Ambulatory Visit (INDEPENDENT_AMBULATORY_CARE_PROVIDER_SITE_OTHER): Payer: BC Managed Care – PPO | Admitting: Ophthalmology

## 2018-10-29 ENCOUNTER — Encounter (INDEPENDENT_AMBULATORY_CARE_PROVIDER_SITE_OTHER): Payer: Self-pay | Admitting: Ophthalmology

## 2018-10-29 ENCOUNTER — Other Ambulatory Visit: Payer: Self-pay

## 2018-10-29 DIAGNOSIS — E113413 Type 2 diabetes mellitus with severe nonproliferative diabetic retinopathy with macular edema, bilateral: Secondary | ICD-10-CM | POA: Diagnosis not present

## 2018-10-29 DIAGNOSIS — H35033 Hypertensive retinopathy, bilateral: Secondary | ICD-10-CM | POA: Diagnosis not present

## 2018-10-29 DIAGNOSIS — I10 Essential (primary) hypertension: Secondary | ICD-10-CM

## 2018-10-29 DIAGNOSIS — H3581 Retinal edema: Secondary | ICD-10-CM

## 2018-10-29 DIAGNOSIS — H25813 Combined forms of age-related cataract, bilateral: Secondary | ICD-10-CM

## 2018-10-29 MED ORDER — AFLIBERCEPT 2MG/0.05ML IZ SOLN FOR KALEIDOSCOPE
2.0000 mg | INTRAVITREAL | Status: AC | PRN
  Administered 2018-10-29: 2 mg via INTRAVITREAL

## 2018-10-29 MED ORDER — AFLIBERCEPT 2MG/0.05ML IZ SOLN FOR KALEIDOSCOPE
2.0000 mg | INTRAVITREAL | Status: AC | PRN
  Administered 2018-10-29: 09:00:00 2 mg via INTRAVITREAL

## 2018-10-31 ENCOUNTER — Encounter (INDEPENDENT_AMBULATORY_CARE_PROVIDER_SITE_OTHER): Payer: BC Managed Care – PPO | Admitting: Ophthalmology

## 2018-11-06 ENCOUNTER — Telehealth (HOSPITAL_COMMUNITY): Payer: Self-pay | Admitting: *Deleted

## 2018-11-06 NOTE — Telephone Encounter (Signed)
The above patient or their representative was contacted and gave the following answers to these questions:         Do you have any of the following symptoms?n  Fever                    Cough                   Shortness of breath  Do  you have any of the following other symptoms? n   muscle pain         vomiting,        diarrhea        rash         weakness        red eye        abdominal pain         bruising          bruising or bleeding              joint pain           severe headache    Have you been in contact with someone who was or has been sick in the past 2 weeks?n  Yes                 Unsure                         Unable to assess   Does the person that you were in contact with have any of the following symptoms?   Cough         shortness of breath           muscle pain         vomiting,            diarrhea            rash            weakness           fever            red eye           abdominal pain           bruising  or  bleeding                joint pain                severe headache               Have you  or someone you have been in contact with traveled internationally in th last month?         If yes, which countries?   Have you  or someone you have been in contact with traveled outside [place] in th last month?         If yes, which state and city?   COMMENTS OR ACTION PLAN FOR THIS PATIENT:         

## 2018-11-08 ENCOUNTER — Other Ambulatory Visit: Payer: Self-pay

## 2018-11-08 DIAGNOSIS — N184 Chronic kidney disease, stage 4 (severe): Secondary | ICD-10-CM

## 2018-11-13 ENCOUNTER — Encounter: Payer: Self-pay | Admitting: Vascular Surgery

## 2018-11-13 ENCOUNTER — Other Ambulatory Visit: Payer: Self-pay

## 2018-11-13 ENCOUNTER — Ambulatory Visit (INDEPENDENT_AMBULATORY_CARE_PROVIDER_SITE_OTHER): Payer: Self-pay | Admitting: Vascular Surgery

## 2018-11-13 ENCOUNTER — Ambulatory Visit (HOSPITAL_COMMUNITY)
Admission: RE | Admit: 2018-11-13 | Discharge: 2018-11-13 | Disposition: A | Discharge status: Home / Self Care | Payer: BC Managed Care – PPO | Source: Ambulatory Visit | Attending: Family | Admitting: Family

## 2018-11-13 VITALS — BP 185/85 | HR 69 | Temp 98.2°F | Resp 18 | Ht 70.0 in | Wt 192.0 lb

## 2018-11-13 DIAGNOSIS — N184 Chronic kidney disease, stage 4 (severe): Secondary | ICD-10-CM | POA: Diagnosis present

## 2018-11-13 NOTE — Progress Notes (Signed)
Patient name: Philip Wilson MRN: [id number] DOB: 09/09/1970 Sex: male  REASON FOR VISIT: Postop check after left radiocephalic AV fistula  HPI: Philip Wilson is a 48 y.o. male with chronic kidney disease that presents for postop check after left radiocephalic AV fistula on [id number].  Patient states he had a little bit of discomfort around the incision but that has since resolved.  No numbness or tingling in the left hand.  He can appreciate a good thrill.  Has not started dialysis yet.  States that his nephrologist says his kidneys still working at 16% and it is unclear when he may need dialysis.  No other concerns.  Past Medical History:  Diagnosis Date  . Cataract    OU  . Chronic kidney disease 05/04/2018  . Diabetes 1.5, managed as type 2 ([place])   . Diabetic retinopathy ([place])    NPDR OU  . Hypertension   . Hypertensive retinopathy    OU    Past Surgical History:  Procedure Laterality Date  . AV FISTULA PLACEMENT Left 09/28/2018   Procedure: Creation of Left arm Radiocephalic ARTERIOVENOUS (AV) FISTULA;  Surgeon: Marty Heck, MD;  Location: [facility];  Service: Vascular;  Laterality: Left;  Marland Kitchen MULTIPLE TOOTH EXTRACTIONS      Family History  Problem Relation Age of Onset  . Heart failure Mother     SOCIAL HISTORY: Social History   Tobacco Use  . Smoking status: Former Research scientist (life sciences)  . Smokeless tobacco: Never Used  . Tobacco comment: quit 20 years ago  Substance Use Topics  . Alcohol use: Not Currently    No Known Allergies  Current Outpatient Medications  Medication Sig Dispense Refill  . ACCU-CHEK GUIDE test strip every morning. Use to test fasting blood glucose  4  . atorvastatin (LIPITOR) 40 MG tablet Take 40 mg by mouth daily at 6 PM.     . cloNIDine (CATAPRES) 0.1 MG tablet Take 0.1 mg by mouth 2 (two) times daily.     . furosemide (LASIX) 40 MG tablet Take 40 mg by mouth daily.    Marland Kitchen labetalol (NORMODYNE) 200 MG tablet Take 200 mg by mouth 3  (three) times daily.     Marland Kitchen lisinopril (PRINIVIL,ZESTRIL) 20 MG tablet Take 20 mg by mouth 2 (two) times a day.     . Multiple Vitamin (MULTIVITAMIN WITH MINERALS) TABS tablet Take 1 tablet by mouth daily.    Marland Kitchen glimepiride (AMARYL) 4 MG tablet Take 4 mg by mouth daily with breakfast.     . HYDROcodone-acetaminophen (NORCO/VICODIN) 5-325 MG tablet Take 1 tablet by mouth every 4 (four) hours as needed for moderate pain. (Patient not taking: Reported on 11/13/2018) 15 tablet 0  . metFORMIN (GLUCOPHAGE) 1000 MG tablet Take 1,000 mg by mouth daily.   6   Current Facility-Administered Medications  Medication Dose Route Frequency Provider Last Rate Last Dose  . aflibercept (EYLEA) SOLN 2 mg  2 mg Intravitreal  Bernarda Caffey, MD   2 mg at 12/05/17 1138  . aflibercept (EYLEA) SOLN 2 mg  2 mg Intravitreal  Bernarda Caffey, MD   2 mg at 01/04/18 1202  . aflibercept (EYLEA) SOLN 2 mg  2 mg Intravitreal  Bernarda Caffey, MD   2 mg at 01/04/18 1203  . aflibercept (EYLEA) SOLN 2 mg  2 mg Intravitreal  Bernarda Caffey, MD   2 mg at 02/05/18 0930  . aflibercept (EYLEA) SOLN 2 mg  2 mg Intravitreal  Bernarda Caffey, MD   2 mg  at 02/05/18 0930  . aflibercept (EYLEA) SOLN 2 mg  2 mg Intravitreal  Bernarda Caffey, MD   2 mg at 03/05/18 1649  . aflibercept (EYLEA) SOLN 2 mg  2 mg Intravitreal  Bernarda Caffey, MD   2 mg at 03/05/18 1649  . aflibercept (EYLEA) SOLN 2 mg  2 mg Intravitreal  Bernarda Caffey, MD   2 mg at 04/05/18 2315  . aflibercept (EYLEA) SOLN 2 mg  2 mg Intravitreal  Bernarda Caffey, MD   2 mg at 04/05/18 2315  . aflibercept (EYLEA) SOLN 2 mg  2 mg Intravitreal  Bernarda Caffey, MD   2 mg at 05/13/18 2254  . aflibercept (EYLEA) SOLN 2 mg  2 mg Intravitreal  Bernarda Caffey, MD   2 mg at 05/13/18 2255  . Bevacizumab (AVASTIN) SOLN 1.25 mg  1.25 mg Intravitreal  Bernarda Caffey, MD   1.25 mg at 09/06/17 1312  . Bevacizumab (AVASTIN) SOLN 1.25 mg  1.25 mg Intravitreal  Bernarda Caffey, MD   1.25 mg at 09/08/17 0933  .  Bevacizumab (AVASTIN) SOLN 1.25 mg  1.25 mg Intravitreal  Bernarda Caffey, MD   1.25 mg at 10/09/17 1144  . Bevacizumab (AVASTIN) SOLN 1.25 mg  1.25 mg Intravitreal  Bernarda Caffey, MD   1.25 mg at 10/09/17 1144  . Bevacizumab (AVASTIN) SOLN 1.25 mg  1.25 mg Intravitreal  Bernarda Caffey, MD   1.25 mg at 11/06/17 1105  . Bevacizumab (AVASTIN) SOLN 1.25 mg  1.25 mg Intravitreal  Bernarda Caffey, MD   1.25 mg at 11/06/17 1105  . Bevacizumab (AVASTIN) SOLN 1.25 mg  1.25 mg Intravitreal  Bernarda Caffey, MD   1.25 mg at 12/05/17 1138    REVIEW OF SYSTEMS:  [X]  denotes positive finding, [ ]  denotes negative finding Cardiac  Comments:  Chest pain or chest pressure:    Shortness of breath upon exertion:    Short of breath when lying flat:    Irregular heart rhythm:        Vascular    Pain in calf, thigh, or hip brought on by ambulation:    Pain in feet at night that wakes you up from your sleep:     Blood clot in your veins:    Leg swelling:         Pulmonary    Oxygen at home:    Productive cough:     Wheezing:         Neurologic    Sudden weakness in arms or legs:     Sudden numbness in arms or legs:     Sudden onset of difficulty speaking or slurred speech:    Temporary loss of vision in one eye:     Problems with dizziness:         Gastrointestinal    Blood in stool:     Vomited blood:         Genitourinary    Burning when urinating:     Blood in urine:        Psychiatric    Major depression:         Hematologic    Bleeding problems:    Problems with blood clotting too easily:        Skin    Rashes or ulcers:        Constitutional    Fever or chills:      PHYSICAL EXAM: Vitals:   11/13/18 1520  BP: (!) 185/85  Pulse: 69  Resp: 18  Temp: 98.2 F (36.8 C)  TempSrc: Temporal  SpO2: 100%  Weight: 192 lb (87.1 kg)  Height: 5\' 10"  (1.778 m)    GENERAL: The patient is a well-nourished male, in no acute distress. The vital signs are documented above. CARDIAC: There  is a regular rate and rhythm.  VASCULAR:  Left wrist incision well healed Left radial pulse palpable Left fistula with good thrill  DATA:   Fistula duplex shows nice flow volumes with a patent fistula that is over 5 mm in the mid distal forearm except for a segment of suspected narrowing at the wrist 3-4 mm  Assessment/Plan:  48 year old male status post left radiocephalic AV fistula.  The ultrasound was concerning for narrowing near the wrist.  That being said he has an excellent thrill in his fistula on exam and nothing that appears pulsatile like I would expect with a hemodynamically significant stenosis.  I looked with the SonoSite myself and there does appear to be several side branches in the forearm that could be ligated.  Discussed options of ligation to facilitate maturation versus hand exercises and have him repeat a duplex in 1 month.  He would prefer doing the hand exercise at this time with further follow-up given that he has no immediate need for dialysis.   Marty Heck, MD [facility] Office: [phone] Pager: [phone]

## 2018-11-26 ENCOUNTER — Encounter (INDEPENDENT_AMBULATORY_CARE_PROVIDER_SITE_OTHER): Payer: BC Managed Care – PPO | Admitting: Ophthalmology

## 2018-12-12 ENCOUNTER — Other Ambulatory Visit: Payer: Self-pay

## 2018-12-12 ENCOUNTER — Encounter (INDEPENDENT_AMBULATORY_CARE_PROVIDER_SITE_OTHER): Payer: Self-pay | Admitting: Ophthalmology

## 2018-12-12 ENCOUNTER — Ambulatory Visit (INDEPENDENT_AMBULATORY_CARE_PROVIDER_SITE_OTHER): Payer: BC Managed Care – PPO | Admitting: Ophthalmology

## 2018-12-12 DIAGNOSIS — H3581 Retinal edema: Secondary | ICD-10-CM

## 2018-12-12 DIAGNOSIS — I10 Essential (primary) hypertension: Secondary | ICD-10-CM

## 2018-12-12 DIAGNOSIS — H25813 Combined forms of age-related cataract, bilateral: Secondary | ICD-10-CM

## 2018-12-12 DIAGNOSIS — E113413 Type 2 diabetes mellitus with severe nonproliferative diabetic retinopathy with macular edema, bilateral: Secondary | ICD-10-CM

## 2018-12-12 DIAGNOSIS — H35033 Hypertensive retinopathy, bilateral: Secondary | ICD-10-CM | POA: Diagnosis not present

## 2018-12-12 MED ORDER — AFLIBERCEPT 2MG/0.05ML IZ SOLN FOR KALEIDOSCOPE
2.0000 mg | INTRAVITREAL | Status: AC | PRN
  Administered 2018-12-12: 2 mg via INTRAVITREAL

## 2018-12-12 MED ORDER — AFLIBERCEPT 2MG/0.05ML IZ SOLN FOR KALEIDOSCOPE
2.0000 mg | INTRAVITREAL | Status: AC | PRN
  Administered 2018-12-12: 23:00:00 2 mg via INTRAVITREAL

## 2018-12-12 NOTE — Progress Notes (Addendum)
[facility] Note  12/12/2018     CHIEF COMPLAINT Patient presents for Retina Follow Up   HISTORY OF PRESENT ILLNESS: Philip Wilson is a 48 y.o. male who presents to the clinic today for:   HPI    Retina Follow Up    Patient presents with  Diabetic Retinopathy.  In both eyes.  Severity is moderate.  Duration of 6 weeks.  Since onset it is stable.  I, the attending physician,  performed the HPI with the patient and updated documentation appropriately.          Comments    Patient states vision the same OU. BS was 82 this am. Last a1c was less than 6.       Last edited by Bernarda Caffey, MD on 12/12/2018 10:57 PM. (History)    . No change in [place] OU since last visit.  Had to adjust appt. date due to another commitment.   Referring physician: Lillard Anes, MD [address] [place],  [place] [address]  HISTORICAL INFORMATION:   Selected notes from the MEDICAL RECORD NUMBER Referred by Dr. Ricki Whitby for diabetic retinopathy OU LEE: 05.28.19 (A. Hager) [BCVA: OD: 20/40 OS: 20/50+2] Ocular Hx-Diabetic Retinopathy PMH-DM    CURRENT MEDICATIONS: No current outpatient medications on file. (Ophthalmic Drugs)   Current Facility-Administered Medications (Ophthalmic Drugs)  Medication Route  . aflibercept (EYLEA) SOLN 2 mg Intravitreal  . aflibercept (EYLEA) SOLN 2 mg Intravitreal  . aflibercept (EYLEA) SOLN 2 mg Intravitreal  . aflibercept (EYLEA) SOLN 2 mg Intravitreal  . aflibercept (EYLEA) SOLN 2 mg Intravitreal  . aflibercept (EYLEA) SOLN 2 mg Intravitreal  . aflibercept (EYLEA) SOLN 2 mg Intravitreal  . aflibercept (EYLEA) SOLN 2 mg Intravitreal  . aflibercept (EYLEA) SOLN 2 mg Intravitreal  . aflibercept (EYLEA) SOLN 2 mg Intravitreal  . aflibercept (EYLEA) SOLN 2 mg Intravitreal   Current Outpatient Medications (Other)  Medication Sig  . ACCU-CHEK GUIDE test strip every morning. Use to test fasting blood glucose  .  atorvastatin (LIPITOR) 40 MG tablet Take 40 mg by mouth daily at 6 PM.   . cloNIDine (CATAPRES) 0.1 MG tablet Take 0.1 mg by mouth 2 (two) times daily.   . furosemide (LASIX) 40 MG tablet Take 40 mg by mouth daily.  Marland Kitchen glimepiride (AMARYL) 4 MG tablet Take 4 mg by mouth daily with breakfast.   . labetalol (NORMODYNE) 200 MG tablet Take 200 mg by mouth 3 (three) times daily.   Marland Kitchen lisinopril (PRINIVIL,ZESTRIL) 20 MG tablet Take 20 mg by mouth 2 (two) times a day.   . metFORMIN (GLUCOPHAGE) 1000 MG tablet Take 1,000 mg by mouth daily.   . Multiple Vitamin (MULTIVITAMIN WITH MINERALS) TABS tablet Take 1 tablet by mouth daily.  Marland Kitchen HYDROcodone-acetaminophen (NORCO/VICODIN) 5-325 MG tablet Take 1 tablet by mouth every 4 (four) hours as needed for moderate pain. (Patient not taking: Reported on 11/13/2018)   Current Facility-Administered Medications (Other)  Medication Route  . Bevacizumab (AVASTIN) SOLN 1.25 mg Intravitreal  . Bevacizumab (AVASTIN) SOLN 1.25 mg Intravitreal  . Bevacizumab (AVASTIN) SOLN 1.25 mg Intravitreal  . Bevacizumab (AVASTIN) SOLN 1.25 mg Intravitreal  . Bevacizumab (AVASTIN) SOLN 1.25 mg Intravitreal  . Bevacizumab (AVASTIN) SOLN 1.25 mg Intravitreal  . Bevacizumab (AVASTIN) SOLN 1.25 mg Intravitreal      REVIEW OF SYSTEMS: ROS    Positive for: Genitourinary, Endocrine, Cardiovascular, Eyes   Negative for: Constitutional, Gastrointestinal, Neurological, Skin, Musculoskeletal, HENT, Respiratory,  Psychiatric, Allergic/Imm, Heme/Lymph   Last edited by Roselee Nova D, COT on 12/12/2018  2:25 PM. (History)       ALLERGIES No Known Allergies  PAST MEDICAL HISTORY Past Medical History:  Diagnosis Date  . Cataract    OU  . Chronic kidney disease 05/04/2018  . Diabetes 1.5, managed as type 2 ([place])   . Diabetic retinopathy ([place])    NPDR OU  . Hypertension   . Hypertensive retinopathy    OU   Past Surgical History:  Procedure Laterality Date  . AV FISTULA PLACEMENT  Left 09/28/2018   Procedure: Creation of Left arm Radiocephalic ARTERIOVENOUS (AV) FISTULA;  Surgeon: Marty Heck, MD;  Location: [place];  Service: Vascular;  Laterality: Left;  Marland Kitchen MULTIPLE TOOTH EXTRACTIONS      FAMILY HISTORY Family History  Problem Relation Age of Onset  . Heart failure Mother     SOCIAL HISTORY Social History   Tobacco Use  . Smoking status: Former Research scientist (life sciences)  . Smokeless tobacco: Never Used  . Tobacco comment: quit 20 years ago  Substance Use Topics  . Alcohol use: Not Currently  . Drug use: Never         OPHTHALMIC EXAM:  Base Eye Exam    Visual Acuity (Snellen - Linear)      Right Left   Dist [place] 20/25 -2 20/20 -2   Dist ph [place] NI 20/20       Tonometry (Tonopen, 2:35 PM)      Right Left   Pressure 11 14       Pupils      Dark Light Shape React APD   Right 3 2 Round Brisk None   Left 3 2 Round Brisk None       Visual Fields (Counting fingers)      Left Right    Full Full       Extraocular Movement      Right Left    Full, Ortho Full, Ortho       Neuro/Psych    Oriented x3: Yes   Mood/Affect: Normal       Dilation    Both eyes: 1.0% Mydriacyl, 2.5% Phenylephrine @ 2:35 PM        Slit Lamp and Fundus Exam    Slit Lamp Exam      Right Left   Lids/Lashes Dermatochalasis - upper lid, mild Meibomian gland dysfunction Dermatochalasis - upper lid, mild Meibomian gland dysfunction   Conjunctiva/Sclera White and quiet White and quiet   Cornea Clear Clear   Anterior Chamber Deep and quiet, no cell or flare Deep and quiet, no cell or flare   Iris Round and dilated to 33mm Round and dilated to 21mm   Lens 1+ Nuclear sclerosis, 2+ Cortical cataract 1+ Nuclear sclerosis, 2+ Cortical cataract   Vitreous Mild Vitreous syneresis Mild Vitreous syneresis, old white VH inferiorly,       Fundus Exam      Right Left   Disc Pink and Sharp Pink and Sharp   C/D Ratio 0.2 0.2   Macula good foveal reflex, cystic changes slightly increased ST  macula. +MA with exudates, mild ERM Blunted foveal reflex, persistent edema with exudates ST to fovea - improving, DBH/MA's persistent   Vessels Tortuous, AV crossing changes, Vascular attenuation Tortuous, AV crossing changes   Periphery Attached, scattered MAs and DBH - mostly posterior, scattered CWS -- almost resolved Attached, scattered MAs and DBH -- mostly posterior -- all improved  IMAGING AND PROCEDURES  Imaging and Procedures for @TODAY @  OCT, Retina - OU - Both Eyes       Right Eye Quality was good. Central Foveal Thickness: 341. Progression has worsened. Findings include intraretinal fluid, outer retinal atrophy, retinal drusen , intraretinal hyper-reflective material, no SRF, abnormal foveal contour (interval increase in ST IRF/DME).   Left Eye Quality was good. Central Foveal Thickness: 274. Progression has worsened. Findings include intraretinal fluid, epiretinal membrane, no SRF, intraretinal hyper-reflective material, abnormal foveal contour (Interval improvement in ST IRF/DME).   Notes *Images captured and stored on drive  Diagnosis / Impression:  DME OU OD: interval increase in ST IRF/DME OS: Interval increase in ST IRF/DME  Clinical management:  See below  Abbreviations: NFP - Normal foveal profile. CME - cystoid macular edema. PED - pigment epithelial detachment. IRF - intraretinal fluid. SRF - subretinal fluid. EZ - ellipsoid zone. ERM - epiretinal membrane. ORA - outer retinal atrophy. ORT - outer retinal tubulation. SRHM - subretinal hyper-reflective material        Intravitreal Injection, Pharmacologic Agent - OD - Right Eye       Time Out 12/12/2018. 2:35 PM. Confirmed correct patient, procedure, site, and patient consented.   Anesthesia Topical anesthesia was used. Anesthetic medications included Lidocaine 2%, Proparacaine 0.5%.   Procedure Preparation included 5% betadine to ocular surface, eyelid speculum. A 30 gauge needle was used.    Injection:  2 mg aflibercept Alfonse Flavors) SOLN   NDC: O5083423, Lot: [id number], Expiration date: 07/02/2019   Route: Intravitreal, Site: Right Eye, Waste: 0.05 mL  Post-op Post injection exam found visual acuity of at least counting fingers. The patient tolerated the procedure well. There were no complications. The patient received written and verbal post procedure care education.        Intravitreal Injection, Pharmacologic Agent - OS - Left Eye       Time Out 12/12/2018. 2:35 PM. Confirmed correct patient, procedure, site, and patient consented.   Anesthesia Topical anesthesia was used. Anesthetic medications included Lidocaine 2%, Proparacaine 0.5%.   Procedure Preparation included 5% betadine to ocular surface, eyelid speculum. A 30 gauge needle was used.   Injection:  2 mg aflibercept Alfonse Flavors) SOLN   NDC: O5083423, Lot: [id number], Expiration date: 07/02/2019   Route: Intravitreal, Site: Left Eye, Waste: 0.05 mL  Post-op Post injection exam found visual acuity of at least counting fingers. The patient tolerated the procedure well. There were no complications. The patient received written and verbal post procedure care education.                 ASSESSMENT/PLAN:    ICD-10-CM   1. Severe nonproliferative diabetic retinopathy of both eyes with macular edema associated with type 2 diabetes mellitus (HCC)  [id number] Intravitreal Injection, Pharmacologic Agent - OD - Right Eye    Intravitreal Injection, Pharmacologic Agent - OS - Left Eye    aflibercept (EYLEA) SOLN 2 mg    aflibercept (EYLEA) SOLN 2 mg  2. Retinal edema  H35.81 OCT, Retina - OU - Both Eyes  3. Essential hypertension  I10   4. Hypertensive retinopathy of both eyes  H35.033   5. Combined forms of age-related cataract of both eyes  H25.813     1, 2. Severe Non-proliferative diabetic retinopathy, both eyes  - initial exam showed massive central DME OU and scattered IRH and IRMA  - FA 6.5.19 with  patches of capillary nonperfusion; extensive Mas with late leakage OU; no frank NV  - S/P  IVA OS #1 (06.05.19), #2 (07.08.19), #3 (07.08.19), #4 (09.03.19)  - S/P IVA OD #1 (06.07.19), #2 (07.08.19), #3 (07.08.19)  - S/P IVE OD #1 (09.03.19), #2 (10.03.19), #3 (11.04.19), #4 (12.02.19), #5 (01.02.20), #6 (02.07.20), #7 (03.16.20), #8 (05.20.20), #9 (06.22.20), #10 (07.27.20)  - S/P IVE OS #1 (10.03.19), #2 (11.04.19), #3 (12.02.19), #4 (01.02.20), #5 (02.07.20), #6 (03.16.20), #7 (05.20.20), #8 (06.22.20),#9 (07.27.20)  - FA 01.02.20, shows improvement in late leaking microaneurysms OU  - f/u delayed to 6 wks instead of 4 wks  - OCT shows interval increase ST IRF/DME OU  - BCVA stable at 20/25 OD, 20/20 OS  - discussed findings and OCT  - recommend IVE OU today (09.09.2020) -- OD #11, OS #10  - pt wishes to proceed  - RBA of procedure discussed, questions answered  - informed consent obtained and signed  - see procedure note -- tolerated well  - Eylea paperwork and benefits investigation started on 08.05.19 -- approved w/ Commercial assistance program -- approved for 2020  - f/u in 4-5 weeks -- DFE/OCT/possible IVE  3,4. Hypertensive retinopathy OU  - discussed importance of tight BP control  - has had some Bps in hypertensive emergency range (200s / 110s) -- today pt reports significant improvement  - discussed likely contribution to DME  - pt reports new diagnosis of renal insufficiency/failure which is complicating his HTN  - monitor  - BP management per nephrology, PCP and cardiology   5. Combined form age-related cataract OU-   - The symptoms of cataract, surgical options, and treatments and risks were discussed with patient.  - discussed diagnosis and progression  - not yet visually significant  - monitor for now   Ophthalmic Meds Ordered this visit:  Meds ordered this encounter  Medications  . aflibercept (EYLEA) SOLN 2 mg  . aflibercept (EYLEA) SOLN 2 mg       Return  in about 4 weeks (around 01/09/2019).  There are no Patient Instructions on file for this visit.   Explained the diagnoses, plan, and follow up with the patient and they expressed understanding.  Patient expressed understanding of the importance of proper follow up care.   This document serves as a record of services personally performed by Gardiner Sleeper, MD, PhD. It was created on their behalf by Ernest Mallick, OA, an ophthalmic assistant. The creation of this record is the provider's dictation and/or activities during the visit.    Electronically signed by: Ernest Mallick, OA  09.09.2020 11:03 PM    Gardiner Sleeper, M.D., Ph.D. Diseases & Surgery of the Retina and Vitreous [facility]  I have reviewed the above documentation for accuracy and completeness, and I agree with the above. Gardiner Sleeper, M.D., Ph.D. 12/12/18 11:03 PM   Abbreviations: M myopia (nearsighted); A astigmatism; H hyperopia (farsighted); P presbyopia; Mrx spectacle prescription;  CTL contact lenses; OD right eye; OS left eye; OU both eyes  XT exotropia; ET esotropia; PEK punctate epithelial keratitis; PEE punctate epithelial erosions; DES dry eye syndrome; MGD meibomian gland dysfunction; ATs artificial tears; PFAT's preservative free artificial tears; [place] nuclear sclerotic cataract; PSC posterior subcapsular cataract; ERM epi-retinal membrane; PVD posterior vitreous detachment; RD retinal detachment; DM diabetes mellitus; DR diabetic retinopathy; NPDR non-proliferative diabetic retinopathy; PDR proliferative diabetic retinopathy; CSME clinically significant macular edema; DME diabetic macular edema; dbh dot blot hemorrhages; CWS cotton wool spot; POAG primary open angle glaucoma; C/D cup-to-disc ratio; HVF humphrey visual field; GVF goldmann visual field;  OCT optical coherence tomography; IOP intraocular pressure; BRVO Branch retinal vein occlusion; CRVO central retinal vein occlusion; CRAO central  retinal artery occlusion; BRAO branch retinal artery occlusion; RT retinal tear; SB scleral buckle; PPV pars plana vitrectomy; VH Vitreous hemorrhage; PRP panretinal laser photocoagulation; IVK intravitreal kenalog; VMT vitreomacular traction; MH Macular hole;  NVD neovascularization of the disc; NVE neovascularization elsewhere; AREDS age related eye disease study; ARMD age related macular degeneration; POAG primary open angle glaucoma; EBMD epithelial/anterior basement membrane dystrophy; ACIOL anterior chamber intraocular lens; IOL intraocular lens; PCIOL posterior chamber intraocular lens; Phaco/IOL phacoemulsification with intraocular lens placement; [place] photorefractive keratectomy; LASIK laser assisted in situ keratomileusis; HTN hypertension; DM diabetes mellitus; COPD chronic obstructive pulmonary disease

## 2018-12-24 ENCOUNTER — Other Ambulatory Visit: Payer: Self-pay

## 2018-12-24 DIAGNOSIS — N184 Chronic kidney disease, stage 4 (severe): Secondary | ICD-10-CM

## 2018-12-25 ENCOUNTER — Other Ambulatory Visit: Payer: Self-pay

## 2018-12-25 ENCOUNTER — Ambulatory Visit (HOSPITAL_COMMUNITY)
Admission: RE | Admit: 2018-12-25 | Discharge: 2018-12-25 | Disposition: A | Discharge status: Home / Self Care | Payer: BC Managed Care – PPO | Source: Ambulatory Visit | Attending: Family | Admitting: Family

## 2018-12-25 ENCOUNTER — Ambulatory Visit: Payer: Self-pay | Admitting: Vascular Surgery

## 2018-12-25 VITALS — BP 194/80 | HR 71 | Temp 97.5°F | Resp 16 | Ht 70.0 in | Wt 194.0 lb

## 2018-12-25 DIAGNOSIS — N184 Chronic kidney disease, stage 4 (severe): Secondary | ICD-10-CM | POA: Diagnosis present

## 2018-12-25 NOTE — Progress Notes (Signed)
Patient did not want to schedule surgery at this time. He has an appt with Dr. Justin Mend on 9-24 and wants to discuss it with him first. He also states that he has a potential kidney donor that is being worked up for transplant. I called Shaquina at [facility] and let her know to call me whenever this revision needs to be scheduled. Patient was given my contact information.

## 2018-12-25 NOTE — Progress Notes (Signed)
Patient name: Philip Wilson MRN: [id number] DOB: [birth year] Sex: male  REASON FOR VISIT: Ongoing follow-up after left radiocephalic AV fistula  HPI: Philip Wilson is a 48 y.o. male with chronic kidney disease that presents for ognoing follow-up after left radiocephalic AV fistula on [id number].  The fistula has been slow to mature.  He has been doing hand exercises with his left hand squeezing a ball.  He presents for another fistula duplex at 1 month.  He has not started dialysis at this time.  States his kidney doctor says his kidneys are working 13%.  States he can have a kidney transplant from a donor at [facility] soon.  Past Medical History:  Diagnosis Date  . Cataract    OU  . Chronic kidney disease 05/04/2018  . Diabetes 1.5, managed as type 2 ([place])   . Diabetic retinopathy ([place])    NPDR OU  . Hypertension   . Hypertensive retinopathy    OU    Past Surgical History:  Procedure Laterality Date  . AV FISTULA PLACEMENT Left 09/28/2018   Procedure: Creation of Left arm Radiocephalic ARTERIOVENOUS (AV) FISTULA;  Surgeon: Marty Heck, MD;  Location: [place];  Service: Vascular;  Laterality: Left;  Marland Kitchen MULTIPLE TOOTH EXTRACTIONS      Family History  Problem Relation Age of Onset  . Heart failure Mother     SOCIAL HISTORY: Social History   Tobacco Use  . Smoking status: Former Research scientist (life sciences)  . Smokeless tobacco: Never Used  . Tobacco comment: quit 20 years ago  Substance Use Topics  . Alcohol use: Not Currently    No Known Allergies  Current Outpatient Medications  Medication Sig Dispense Refill  . ACCU-CHEK GUIDE test strip every morning. Use to test fasting blood glucose  4  . atorvastatin (LIPITOR) 40 MG tablet Take 40 mg by mouth daily at 6 PM.     . cloNIDine (CATAPRES) 0.1 MG tablet Take 0.1 mg by mouth 2 (two) times daily.     . furosemide (LASIX) 40 MG tablet Take 40 mg by mouth daily.    Marland Kitchen glimepiride (AMARYL) 4 MG tablet Take 4 mg by mouth daily  with breakfast.     . HYDROcodone-acetaminophen (NORCO/VICODIN) 5-325 MG tablet Take 1 tablet by mouth every 4 (four) hours as needed for moderate pain. 15 tablet 0  . labetalol (NORMODYNE) 200 MG tablet Take 200 mg by mouth 3 (three) times daily.     Marland Kitchen lisinopril (PRINIVIL,ZESTRIL) 20 MG tablet Take 20 mg by mouth 2 (two) times a day.     . metFORMIN (GLUCOPHAGE) 1000 MG tablet Take 1,000 mg by mouth daily.   6  . Multiple Vitamin (MULTIVITAMIN WITH MINERALS) TABS tablet Take 1 tablet by mouth daily.     Current Facility-Administered Medications  Medication Dose Route Frequency Provider Last Rate Last Dose  . aflibercept (EYLEA) SOLN 2 mg  2 mg Intravitreal  Bernarda Caffey, MD   2 mg at 12/05/17 1138  . aflibercept (EYLEA) SOLN 2 mg  2 mg Intravitreal  Bernarda Caffey, MD   2 mg at 01/04/18 1202  . aflibercept (EYLEA) SOLN 2 mg  2 mg Intravitreal  Bernarda Caffey, MD   2 mg at 01/04/18 1203  . aflibercept (EYLEA) SOLN 2 mg  2 mg Intravitreal  Bernarda Caffey, MD   2 mg at 02/05/18 0930  . aflibercept (EYLEA) SOLN 2 mg  2 mg Intravitreal  Bernarda Caffey, MD   2 mg at 02/05/18 0930  .  aflibercept (EYLEA) SOLN 2 mg  2 mg Intravitreal  Bernarda Caffey, MD   2 mg at 03/05/18 1649  . aflibercept (EYLEA) SOLN 2 mg  2 mg Intravitreal  Bernarda Caffey, MD   2 mg at 03/05/18 1649  . aflibercept (EYLEA) SOLN 2 mg  2 mg Intravitreal  Bernarda Caffey, MD   2 mg at 04/05/18 2315  . aflibercept (EYLEA) SOLN 2 mg  2 mg Intravitreal  Bernarda Caffey, MD   2 mg at 04/05/18 2315  . aflibercept (EYLEA) SOLN 2 mg  2 mg Intravitreal  Bernarda Caffey, MD   2 mg at 05/13/18 2254  . aflibercept (EYLEA) SOLN 2 mg  2 mg Intravitreal  Bernarda Caffey, MD   2 mg at 05/13/18 2255  . Bevacizumab (AVASTIN) SOLN 1.25 mg  1.25 mg Intravitreal  Bernarda Caffey, MD   1.25 mg at 09/06/17 1312  . Bevacizumab (AVASTIN) SOLN 1.25 mg  1.25 mg Intravitreal  Bernarda Caffey, MD   1.25 mg at 09/08/17 0933  . Bevacizumab (AVASTIN) SOLN 1.25 mg  1.25 mg  Intravitreal  Bernarda Caffey, MD   1.25 mg at 10/09/17 1144  . Bevacizumab (AVASTIN) SOLN 1.25 mg  1.25 mg Intravitreal  Bernarda Caffey, MD   1.25 mg at 10/09/17 1144  . Bevacizumab (AVASTIN) SOLN 1.25 mg  1.25 mg Intravitreal  Bernarda Caffey, MD   1.25 mg at 11/06/17 1105  . Bevacizumab (AVASTIN) SOLN 1.25 mg  1.25 mg Intravitreal  Bernarda Caffey, MD   1.25 mg at 11/06/17 1105  . Bevacizumab (AVASTIN) SOLN 1.25 mg  1.25 mg Intravitreal  Bernarda Caffey, MD   1.25 mg at 12/05/17 1138    REVIEW OF SYSTEMS:  [X]  denotes positive finding, [ ]  denotes negative finding Cardiac  Comments:  Chest pain or chest pressure:    Shortness of breath upon exertion:    Short of breath when lying flat:    Irregular heart rhythm:        Vascular    Pain in calf, thigh, or hip brought on by ambulation:    Pain in feet at night that wakes you up from your sleep:     Blood clot in your veins:    Leg swelling:         Pulmonary    Oxygen at home:    Productive cough:     Wheezing:         Neurologic    Sudden weakness in arms or legs:     Sudden numbness in arms or legs:     Sudden onset of difficulty speaking or slurred speech:    Temporary loss of vision in one eye:     Problems with dizziness:         Gastrointestinal    Blood in stool:     Vomited blood:         Genitourinary    Burning when urinating:     Blood in urine:        Psychiatric    Major depression:         Hematologic    Bleeding problems:    Problems with blood clotting too easily:        Skin    Rashes or ulcers:        Constitutional    Fever or chills:      PHYSICAL EXAM: Vitals:   12/25/18 1155 12/25/18 1158 12/25/18 1205  BP: (!) 200/91 (!) 199/96 (!) 194/80  Pulse: 71 71  71  Resp: 16    Temp: (!) 97.5 F (36.4 C)    TempSrc: Temporal    SpO2: 100%    Weight: 194 lb (88 kg)    Height: 5\' 10"  (1.778 m)      GENERAL: The patient is a well-nourished male, in no acute distress. The vital signs are  documented above. CARDIAC: There is a regular rate and rhythm.  VASCULAR:  Left wrist incision well healed Left radial pulse palpable Left fistula with good thrill  DATA:   Fistula duplex shows nice flow volumes with a patent fistula that is 3-4 mm in the mforearm  Assessment/Plan:  48 year old male status post left radiocephalic AV fistula.  Fistula has still been slow to mature based on duplex today.  He has a great thrill on exam.  I did look with an ultrasound again and he has several side branches that I think can be ligated to facilitate maturation.  He would like to proceed with left arm AV fistula revision with sidebranch ligation.  Hopeful if he gets a kidney transplant at [facility] he may never need the fistula, but we will continue to try and get this to work as optimal as possible.  Marty Heck, MD [facility] Office: [phone] Pager: [phone]

## 2019-01-09 ENCOUNTER — Encounter (INDEPENDENT_AMBULATORY_CARE_PROVIDER_SITE_OTHER): Payer: BC Managed Care – PPO | Admitting: Ophthalmology

## 2019-01-23 ENCOUNTER — Encounter (INDEPENDENT_AMBULATORY_CARE_PROVIDER_SITE_OTHER): Payer: BC Managed Care – PPO | Admitting: Ophthalmology

## 2019-01-29 NOTE — Progress Notes (Signed)
[facility] Note  01/30/2019     CHIEF COMPLAINT Patient presents for Retina Follow Up   HISTORY OF PRESENT ILLNESS: Philip Wilson is a 48 y.o. male who presents to the clinic today for:   HPI    Retina Follow Up    Patient presents with  Diabetic Retinopathy.  In both eyes.  This started weeks ago.  Severity is moderate.  Duration of weeks.  Since onset it is stable.  I, the attending physician,  performed the HPI with the patient and updated documentation appropriately.          Comments    Patient states his vision is stable.  Patient denies eye pain or discomfort and denies any new or worsening floaters or fol OU.       Last edited by Bernarda Caffey, MD on 01/30/2019  4:24 PM. (History)    Pt states he will probably be having a kidney transplant in December, he states he has not started dialysis yet   Referring physician: Phylliss Blakes, OD [place],  [place] [address]  HISTORICAL INFORMATION:   Selected notes from the MEDICAL RECORD NUMBER Referred by Dr. Ricki Mullane for diabetic retinopathy OU LEE: 05.28.19 (A. Hager) [BCVA: OD: 20/40 OS: 20/50+2] Ocular Hx-Diabetic Retinopathy PMH-DM    CURRENT MEDICATIONS: No current outpatient medications on file. (Ophthalmic Drugs)   Current Facility-Administered Medications (Ophthalmic Drugs)  Medication Route  . aflibercept (EYLEA) SOLN 2 mg Intravitreal  . aflibercept (EYLEA) SOLN 2 mg Intravitreal  . aflibercept (EYLEA) SOLN 2 mg Intravitreal  . aflibercept (EYLEA) SOLN 2 mg Intravitreal  . aflibercept (EYLEA) SOLN 2 mg Intravitreal  . aflibercept (EYLEA) SOLN 2 mg Intravitreal  . aflibercept (EYLEA) SOLN 2 mg Intravitreal  . aflibercept (EYLEA) SOLN 2 mg Intravitreal  . aflibercept (EYLEA) SOLN 2 mg Intravitreal  . aflibercept (EYLEA) SOLN 2 mg Intravitreal  . aflibercept (EYLEA) SOLN 2 mg Intravitreal   Current Outpatient Medications (Other)  Medication Sig  .  ACCU-CHEK GUIDE test strip every morning. Use to test fasting blood glucose  . atorvastatin (LIPITOR) 40 MG tablet Take 40 mg by mouth daily at 6 PM.   . cloNIDine (CATAPRES) 0.1 MG tablet Take 0.1 mg by mouth 2 (two) times daily.   . furosemide (LASIX) 40 MG tablet Take 40 mg by mouth daily.  Marland Kitchen glimepiride (AMARYL) 4 MG tablet Take 4 mg by mouth daily with breakfast.   . HYDROcodone-acetaminophen (NORCO/VICODIN) 5-325 MG tablet Take 1 tablet by mouth every 4 (four) hours as needed for moderate pain.  Marland Kitchen labetalol (NORMODYNE) 200 MG tablet Take 200 mg by mouth 3 (three) times daily.   Marland Kitchen lisinopril (PRINIVIL,ZESTRIL) 20 MG tablet Take 20 mg by mouth 2 (two) times a day.   . metFORMIN (GLUCOPHAGE) 1000 MG tablet Take 1,000 mg by mouth daily.   . Multiple Vitamin (MULTIVITAMIN WITH MINERALS) TABS tablet Take 1 tablet by mouth daily.   Current Facility-Administered Medications (Other)  Medication Route  . Bevacizumab (AVASTIN) SOLN 1.25 mg Intravitreal  . Bevacizumab (AVASTIN) SOLN 1.25 mg Intravitreal  . Bevacizumab (AVASTIN) SOLN 1.25 mg Intravitreal  . Bevacizumab (AVASTIN) SOLN 1.25 mg Intravitreal  . Bevacizumab (AVASTIN) SOLN 1.25 mg Intravitreal  . Bevacizumab (AVASTIN) SOLN 1.25 mg Intravitreal  . Bevacizumab (AVASTIN) SOLN 1.25 mg Intravitreal      REVIEW OF SYSTEMS: ROS    Positive for: Genitourinary, Endocrine, Cardiovascular, Eyes   Negative for: Constitutional, Gastrointestinal,  Neurological, Skin, Musculoskeletal, HENT, Respiratory, Psychiatric, Allergic/Imm, Heme/Lymph   Last edited by Leeann Must L on 01/30/2019  3:00 PM. (History)       ALLERGIES No Known Allergies  PAST MEDICAL HISTORY Past Medical History:  Diagnosis Date  . Cataract    OU  . Chronic kidney disease 05/04/2018  . Diabetes 1.5, managed as type 2 ([place])   . Diabetic retinopathy ([place])    NPDR OU  . Hypertension   . Hypertensive retinopathy    OU   Past Surgical History:  Procedure  Laterality Date  . AV FISTULA PLACEMENT Left 09/28/2018   Procedure: Creation of Left arm Radiocephalic ARTERIOVENOUS (AV) FISTULA;  Surgeon: Marty Heck, MD;  Location: [place];  Service: Vascular;  Laterality: Left;  Marland Kitchen MULTIPLE TOOTH EXTRACTIONS      FAMILY HISTORY Family History  Problem Relation Age of Onset  . Heart failure Mother     SOCIAL HISTORY Social History   Tobacco Use  . Smoking status: Former Research scientist (life sciences)  . Smokeless tobacco: Never Used  . Tobacco comment: quit 20 years ago  Substance Use Topics  . Alcohol use: Not Currently  . Drug use: Never         OPHTHALMIC EXAM:  Base Eye Exam    Visual Acuity (Snellen - Linear)      Right Left   Dist [place] 20/40 20/30 -2   Dist ph [place] 20/30 -1 20/25 -2       Tonometry (Tonopen, 3:05 PM)      Right Left   Pressure 17 15       Pupils      Pupils Dark Light Shape React APD   Right PERRL 3 2 Round Brisk 0   Left PERRL 3 2  Round Brisk 0       Visual Fields      Left Right    Full Full       Extraocular Movement      Right Left    Full Full       Neuro/Psych    Oriented x3: Yes   Mood/Affect: Normal       Dilation    Both eyes: 1.0% Mydriacyl, 2.5% Phenylephrine @ 3:05 PM        Slit Lamp and Fundus Exam    Slit Lamp Exam      Right Left   Lids/Lashes Dermatochalasis - upper lid, mild Meibomian gland dysfunction Dermatochalasis - upper lid, mild Meibomian gland dysfunction   Conjunctiva/Sclera White and quiet White and quiet   Cornea Clear Clear   Anterior Chamber Deep and quiet, no cell or flare Deep and quiet, no cell or flare   Iris Round and dilated to 57mm Round and dilated to 5mm   Lens 1+ Nuclear sclerosis, 2+ Cortical cataract 1+ Nuclear sclerosis, 2+ Cortical cataract   Vitreous Mild Vitreous syneresis Mild Vitreous syneresis, old white VH inferiorly,       Fundus Exam      Right Left   Disc Pink and Sharp Pink and Sharp   C/D Ratio 0.2 0.2   Macula good foveal reflex, cystic  changes slightly increased ST macula. +MA with exudates - improved in appearance, mild ERM good foveal reflex, persistent edema with exudates ST to fovea - improving, DBH/MA's persistentbut improved   Vessels Tortuous, AV crossing changes, Vascular attenuation Tortuous, AV crossing changes   Periphery Attached, scattered MAs and DBH - mostly posterior, scattered CWS -- almost resolved Attached, scattered MAs and  DBH -- mostly posterior -- all improved          IMAGING AND PROCEDURES  Imaging and Procedures for @TODAY @  OCT, Retina - OU - Both Eyes       Right Eye Quality was good. Central Foveal Thickness: 464. Progression has worsened. Findings include intraretinal fluid, outer retinal atrophy, retinal drusen , intraretinal hyper-reflective material, no SRF, abnormal foveal contour, epiretinal membrane (interval worsening of IRF and foveal contour).   Left Eye Quality was good. Central Foveal Thickness: 265. Progression has improved. Findings include intraretinal fluid, epiretinal membrane, no SRF, intraretinal hyper-reflective material, abnormal foveal contour (Interval improvement in ST IRF/DME).   Notes *Images captured and stored on drive  Diagnosis / Impression:  DME OU OD: interval worsening of IRF / foveal contour OS: Interval improvement in ST IRF/DME  Clinical management:  See below  Abbreviations: NFP - Normal foveal profile. CME - cystoid macular edema. PED - pigment epithelial detachment. IRF - intraretinal fluid. SRF - subretinal fluid. EZ - ellipsoid zone. ERM - epiretinal membrane. ORA - outer retinal atrophy. ORT - outer retinal tubulation. SRHM - subretinal hyper-reflective material        Intravitreal Injection, Pharmacologic Agent - OD - Right Eye       Time Out 01/30/2019. 3:10 PM. Confirmed correct patient, procedure, site, and patient consented.   Anesthesia Topical anesthesia was used. Anesthetic medications included Lidocaine 2%, Proparacaine  0.5%.   Procedure Preparation included 5% betadine to ocular surface, eyelid speculum. A 30 gauge needle was used.   Injection:  2 mg aflibercept Alfonse Flavors) SOLN   NDC: O5083423, Lot: [id number], Expiration date: 07/23/2019   Route: Intravitreal, Site: Right Eye, Waste: 0.05 mL  Post-op Post injection exam found visual acuity of at least counting fingers. The patient tolerated the procedure well. There were no complications. The patient received written and verbal post procedure care education.        Intravitreal Injection, Pharmacologic Agent - OS - Left Eye       Time Out 01/30/2019. 3:16 PM. Confirmed correct patient, procedure, site, and patient consented.   Anesthesia Topical anesthesia was used. Anesthetic medications included Lidocaine 2%, Proparacaine 0.5%.   Procedure Preparation included 5% betadine to ocular surface, eyelid speculum. A 30 gauge needle was used.   Injection:  2 mg aflibercept Alfonse Flavors) SOLN   NDC: O5083423, Lot: [id number], Expiration date: 08/29/2019   Route: Intravitreal, Site: Left Eye, Waste: 0.05 mL  Post-op Post injection exam found visual acuity of at least counting fingers. The patient tolerated the procedure well. There were no complications. The patient received written and verbal post procedure care education.                 ASSESSMENT/PLAN:    ICD-10-CM   1. Severe nonproliferative diabetic retinopathy of both eyes with macular edema associated with type 2 diabetes mellitus (HCC)  [id number] Intravitreal Injection, Pharmacologic Agent - OD - Right Eye    Intravitreal Injection, Pharmacologic Agent - OS - Left Eye    aflibercept (EYLEA) SOLN 2 mg    aflibercept (EYLEA) SOLN 2 mg  2. Retinal edema  H35.81 OCT, Retina - OU - Both Eyes  3. Essential hypertension  I10   4. Hypertensive retinopathy of both eyes  H35.033   5. Combined forms of age-related cataract of both eyes  H25.813     1, 2. Severe Non-proliferative  diabetic retinopathy, both eyes  - initial exam showed massive central DME OU and scattered IRH and  IRMA  - FA 6.5.19 with patches of capillary nonperfusion; extensive Mas with late leakage OU; no frank NV  - FA 01.02.20, shows improvement in late leaking microaneurysms OU  - S/P IVA OS #1 (06.05.19), #2 (07.08.19), #3 (07.08.19), #4 (09.03.19)  - S/P IVA OD #1 (06.07.19), #2 (07.08.19), #3 (07.08.19)  - S/P IVE OD #1 (09.03.19), #2 (10.03.19), #3 (11.04.19), #4 (12.02.19), #5 (01.02.20), #6 (02.07.20), #7 (03.16.20), #8 (05.20.20), #9 (06.22.20), #10 (07.27.20), # 11(09.09.10)  - S/P IVE OS #1 (10.03.19), #2 (11.04.19), #3 (12.02.19), #4 (01.02.20), #5 (02.07.20), #6 (03.16.20), #7 (05.20.20), #8 (06.22.20),#9 (07.27.20), #10 (09.09.20)  - f/u delayed to 7 wks instead of 4 wks -- acute nephrology appts  - OCT shows interval increase in ST IRF/DME OD, interval decrease OS  - BCVA slightly decreased OD to 20/30 from 20/25 and OS decreased to 20/25 from 20/20  - discussed findings and OCT  - recommend IVE OU today (10.28.20) -- OD #12, OS #11  - pt wishes to proceed  - RBA of procedure discussed, questions answered  - informed consent obtained and signed  - see procedure note -- tolerated well  - Eylea paperwork and benefits investigation started on 08.05.19 -- approved w/ Commercial assistance program -- approved for 2020  - f/u in 4-5 weeks -- DFE/OCT/possible IVE  3,4. Hypertensive retinopathy OU  - discussed importance of tight BP control  - has had some Bps in hypertensive emergency range (200s / 110s) -- today pt reports significant improvement  - discussed likely contribution to DME  - pt reports new diagnosis of renal insufficiency/failure which is complicating his HTN  - monitor  - BP management per nephrology, PCP and cardiology   5. Combined form age-related cataract OU-   - The symptoms of cataract, surgical options, and treatments and risks were discussed with patient.  -  discussed diagnosis and progression  - not yet visually significant  - monitor for now   Ophthalmic Meds Ordered this visit:  Meds ordered this encounter  Medications  . aflibercept (EYLEA) SOLN 2 mg  . aflibercept (EYLEA) SOLN 2 mg       Return for f/u 4-5 weeks, NPDR OU, DFE, OCT.  There are no Patient Instructions on file for this visit.   Explained the diagnoses, plan, and follow up with the patient and they expressed understanding.  Patient expressed understanding of the importance of proper follow up care.   This document serves as a record of services personally performed by Gardiner Sleeper, MD, PhD. It was created on their behalf by Roselee Nova, COMT. The creation of this record is the provider's dictation and/or activities during the visit.  Electronically signed by: Roselee Nova, COMT 01/30/19 4:39 PM  Gardiner Sleeper, M.D., Ph.D. Diseases & Surgery of the Retina and Vitreous [facility] 01/30/19  I have reviewed the above documentation for accuracy and completeness, and I agree with the above. Gardiner Sleeper, M.D., Ph.D. 01/30/19 4:39 PM   Abbreviations: M myopia (nearsighted); A astigmatism; H hyperopia (farsighted); P presbyopia; Mrx spectacle prescription;  CTL contact lenses; OD right eye; OS left eye; OU both eyes  XT exotropia; ET esotropia; PEK punctate epithelial keratitis; PEE punctate epithelial erosions; DES dry eye syndrome; MGD meibomian gland dysfunction; ATs artificial tears; PFAT's preservative free artificial tears; [place] nuclear sclerotic cataract; PSC posterior subcapsular cataract; ERM epi-retinal membrane; PVD posterior vitreous detachment; RD retinal detachment; DM diabetes mellitus; DR diabetic retinopathy; NPDR non-proliferative diabetic retinopathy;  PDR proliferative diabetic retinopathy; CSME clinically significant macular edema; DME diabetic macular edema; dbh dot blot hemorrhages; CWS cotton wool spot; POAG primary open  angle glaucoma; C/D cup-to-disc ratio; HVF humphrey visual field; GVF goldmann visual field; OCT optical coherence tomography; IOP intraocular pressure; BRVO Branch retinal vein occlusion; CRVO central retinal vein occlusion; CRAO central retinal artery occlusion; BRAO branch retinal artery occlusion; RT retinal tear; SB scleral buckle; PPV pars plana vitrectomy; VH Vitreous hemorrhage; PRP panretinal laser photocoagulation; IVK intravitreal kenalog; VMT vitreomacular traction; MH Macular hole;  NVD neovascularization of the disc; NVE neovascularization elsewhere; AREDS age related eye disease study; ARMD age related macular degeneration; POAG primary open angle glaucoma; EBMD epithelial/anterior basement membrane dystrophy; ACIOL anterior chamber intraocular lens; IOL intraocular lens; PCIOL posterior chamber intraocular lens; Phaco/IOL phacoemulsification with intraocular lens placement; [place] photorefractive keratectomy; LASIK laser assisted in situ keratomileusis; HTN hypertension; DM diabetes mellitus; COPD chronic obstructive pulmonary disease

## 2019-01-30 ENCOUNTER — Ambulatory Visit (INDEPENDENT_AMBULATORY_CARE_PROVIDER_SITE_OTHER): Payer: BC Managed Care – PPO | Admitting: Ophthalmology

## 2019-01-30 ENCOUNTER — Encounter (INDEPENDENT_AMBULATORY_CARE_PROVIDER_SITE_OTHER): Payer: Self-pay | Admitting: Ophthalmology

## 2019-01-30 ENCOUNTER — Other Ambulatory Visit: Payer: Self-pay

## 2019-01-30 DIAGNOSIS — H3581 Retinal edema: Secondary | ICD-10-CM | POA: Diagnosis not present

## 2019-01-30 DIAGNOSIS — I10 Essential (primary) hypertension: Secondary | ICD-10-CM | POA: Diagnosis not present

## 2019-01-30 DIAGNOSIS — H35033 Hypertensive retinopathy, bilateral: Secondary | ICD-10-CM

## 2019-01-30 DIAGNOSIS — E113413 Type 2 diabetes mellitus with severe nonproliferative diabetic retinopathy with macular edema, bilateral: Secondary | ICD-10-CM | POA: Diagnosis not present

## 2019-01-30 DIAGNOSIS — H25813 Combined forms of age-related cataract, bilateral: Secondary | ICD-10-CM

## 2019-01-30 MED ORDER — AFLIBERCEPT 2MG/0.05ML IZ SOLN FOR KALEIDOSCOPE
2.0000 mg | INTRAVITREAL | Status: AC | PRN
  Administered 2019-01-30: 16:00:00 2 mg via INTRAVITREAL

## 2019-02-21 NOTE — Progress Notes (Signed)
[facility] Note  02/27/2019     CHIEF COMPLAINT Patient presents for Retina Follow Up   HISTORY OF PRESENT ILLNESS: Philip Wilson is a 48 y.o. male who presents to the clinic today for:   HPI    Retina Follow Up    Patient presents with  Diabetic Retinopathy.  In both eyes.  This started weeks ago.  Severity is moderate.  Duration of weeks.  Since onset it is stable.  I, the attending physician,  performed the HPI with the patient and updated documentation appropriately.          Comments    Pt states his vision is stable in both eyes.  Denies eye pain or discomfort and denies new or worsening floaters or fol OU.       Last edited by Bernarda Caffey, MD on 02/27/2019 11:52 AM. (History)      Referring physician: Lillard Anes, MD [id number],  [place] [address]  HISTORICAL INFORMATION:   Selected notes from the MEDICAL RECORD NUMBER Referred by Dr. Ricki Hosea for diabetic retinopathy OU LEE: 05.28.19 (A. Hager) [BCVA: OD: 20/40 OS: 20/50+2] Ocular Hx-Diabetic Retinopathy PMH-DM    CURRENT MEDICATIONS: No current outpatient medications on file. (Ophthalmic Drugs)   Current Facility-Administered Medications (Ophthalmic Drugs)  Medication Route  . aflibercept (EYLEA) SOLN 2 mg Intravitreal  . aflibercept (EYLEA) SOLN 2 mg Intravitreal  . aflibercept (EYLEA) SOLN 2 mg Intravitreal  . aflibercept (EYLEA) SOLN 2 mg Intravitreal  . aflibercept (EYLEA) SOLN 2 mg Intravitreal  . aflibercept (EYLEA) SOLN 2 mg Intravitreal  . aflibercept (EYLEA) SOLN 2 mg Intravitreal  . aflibercept (EYLEA) SOLN 2 mg Intravitreal  . aflibercept (EYLEA) SOLN 2 mg Intravitreal  . aflibercept (EYLEA) SOLN 2 mg Intravitreal  . aflibercept (EYLEA) SOLN 2 mg Intravitreal   Current Outpatient Medications (Other)  Medication Sig  . ACCU-CHEK GUIDE test strip every morning. Use to test fasting blood glucose  . atorvastatin (LIPITOR)  40 MG tablet Take 40 mg by mouth daily at 6 PM.   . cloNIDine (CATAPRES) 0.1 MG tablet Take 0.1 mg by mouth 2 (two) times daily.   . furosemide (LASIX) 40 MG tablet Take 40 mg by mouth daily.  Marland Kitchen glimepiride (AMARYL) 4 MG tablet Take 4 mg by mouth daily with breakfast.   . HYDROcodone-acetaminophen (NORCO/VICODIN) 5-325 MG tablet Take 1 tablet by mouth every 4 (four) hours as needed for moderate pain.  Marland Kitchen labetalol (NORMODYNE) 200 MG tablet Take 200 mg by mouth 3 (three) times daily.   Marland Kitchen lisinopril (PRINIVIL,ZESTRIL) 20 MG tablet Take 20 mg by mouth 2 (two) times a day.   . metFORMIN (GLUCOPHAGE) 1000 MG tablet Take 1,000 mg by mouth daily.   . Multiple Vitamin (MULTIVITAMIN WITH MINERALS) TABS tablet Take 1 tablet by mouth daily.   Current Facility-Administered Medications (Other)  Medication Route  . Bevacizumab (AVASTIN) SOLN 1.25 mg Intravitreal  . Bevacizumab (AVASTIN) SOLN 1.25 mg Intravitreal  . Bevacizumab (AVASTIN) SOLN 1.25 mg Intravitreal  . Bevacizumab (AVASTIN) SOLN 1.25 mg Intravitreal  . Bevacizumab (AVASTIN) SOLN 1.25 mg Intravitreal  . Bevacizumab (AVASTIN) SOLN 1.25 mg Intravitreal  . Bevacizumab (AVASTIN) SOLN 1.25 mg Intravitreal      REVIEW OF SYSTEMS: ROS    Positive for: Genitourinary, Endocrine, Cardiovascular, Eyes   Negative for: Constitutional, Gastrointestinal, Neurological, Skin, Musculoskeletal, HENT, Respiratory, Psychiatric, Allergic/Imm, Heme/Lymph   Last edited by Doneen Poisson on 02/27/2019  9:47 AM. (History)       ALLERGIES No Known Allergies  PAST MEDICAL HISTORY Past Medical History:  Diagnosis Date  . Cataract    OU  . Chronic kidney disease 05/04/2018  . Diabetes 1.5, managed as type 2 ([place])   . Diabetic retinopathy ([place])    NPDR OU  . Hypertension   . Hypertensive retinopathy    OU   Past Surgical History:  Procedure Laterality Date  . AV FISTULA PLACEMENT Left 09/28/2018   Procedure: Creation of Left arm Radiocephalic  ARTERIOVENOUS (AV) FISTULA;  Surgeon: Marty Heck, MD;  Location: [facility];  Service: Vascular;  Laterality: Left;  Marland Kitchen MULTIPLE TOOTH EXTRACTIONS      FAMILY HISTORY Family History  Problem Relation Age of Onset  . Heart failure Mother     SOCIAL HISTORY Social History   Tobacco Use  . Smoking status: Former Research scientist (life sciences)  . Smokeless tobacco: Never Used  . Tobacco comment: quit 20 years ago  Substance Use Topics  . Alcohol use: Not Currently  . Drug use: Never         OPHTHALMIC EXAM:  Base Eye Exam    Visual Acuity (Snellen - Linear)      Right Left   Dist [place] 20/30 -2 20/30 -2   Dist ph [place] NI 20/25       Tonometry (Tonopen, 9:50 AM)      Right Left   Pressure 19 16       Pupils      Dark Light Shape React APD   Right 3 2 Round Brisk 0   Left 3 2 Round Brisk 0       Visual Fields      Left Right    Full Full       Extraocular Movement      Right Left    Full Full       Neuro/Psych    Oriented x3: Yes   Mood/Affect: Normal       Dilation    Both eyes: 1.0% Mydriacyl, 2.5% Phenylephrine @ 9:51 AM        Slit Lamp and Fundus Exam    Slit Lamp Exam      Right Left   Lids/Lashes Dermatochalasis - upper lid, mild Meibomian gland dysfunction Dermatochalasis - upper lid, mild Meibomian gland dysfunction   Conjunctiva/Sclera White and quiet White and quiet   Cornea Clear Clear   Anterior Chamber Deep and quiet, no cell or flare Deep and quiet, no cell or flare   Iris Round and dilated to 33mm Round and dilated to 75mm   Lens 1+ Nuclear sclerosis, 2+ Cortical cataract 1+ Nuclear sclerosis, 2+ Cortical cataract   Vitreous Mild Vitreous syneresis Mild Vitreous syneresis, old white VH inferiorly,       Fundus Exam      Right Left   Disc Pink and Sharp Pink and Sharp   C/D Ratio 0.2 0.2   Macula good foveal reflex, cystic changes improved ST macula. +MA with exudates - improved in appearance, + ERM good foveal reflex, persistent edema with exudates ST  to fovea - persistent, DBH/MA's persistent but improved   Vessels Tortuous, AV crossing changes, Vascular attenuation Tortuous, AV crossing changes   Periphery Attached, scattered MAs and DBH - mostly posterior Attached, scattered MAs and DBH -- mostly posterior -- all improved--very minimal now, Pigmentation          IMAGING AND PROCEDURES  Imaging and Procedures for @TODAY @  OCT, Retina - OU - Both Eyes       Right Eye Quality was good. Central Foveal Thickness: 364. Progression has improved. Findings include intraretinal fluid, outer retinal atrophy, retinal drusen , intraretinal hyper-reflective material, no SRF, abnormal foveal contour, epiretinal membrane (interval worsening of IRF and foveal contour, +ERM).   Left Eye Quality was good. Central Foveal Thickness: 260. Progression has been stable. Findings include intraretinal fluid, epiretinal membrane, no SRF, intraretinal hyper-reflective material, abnormal foveal contour (persistent IRF/DME).   Notes *Images captured and stored on drive  Diagnosis / Impression:  DME OU OD: interval improvement of IRF / foveal contour, +ERM OS: persistent IRF/DME -- no significant change from prior  Clinical management:  See below  Abbreviations: NFP - Normal foveal profile. CME - cystoid macular edema. PED - pigment epithelial detachment. IRF - intraretinal fluid. SRF - subretinal fluid. EZ - ellipsoid zone. ERM - epiretinal membrane. ORA - outer retinal atrophy. ORT - outer retinal tubulation. SRHM - subretinal hyper-reflective material        Intravitreal Injection, Pharmacologic Agent - OD - Right Eye       Time Out 02/27/2019. 9:54 AM. Confirmed correct patient, procedure, site, and patient consented.   Anesthesia Topical anesthesia was used. Anesthetic medications included Lidocaine 2%, Proparacaine 0.5%.   Procedure Preparation included 5% betadine to ocular surface, eyelid speculum. A 30 gauge needle was used.    Injection:  2 mg aflibercept Alfonse Flavors) SOLN   NDC: O5083423, [id number], Expiration date: 07/30/2019   Route: Intravitreal, Site: Right Eye, Waste: 0.05 mL  Post-op Post injection exam found visual acuity of at least counting fingers. The patient tolerated the procedure well. There were no complications. The patient received written and verbal post procedure care education.        Intravitreal Injection, Pharmacologic Agent - OS - Left Eye       Time Out 02/27/2019. 9:54 AM. Confirmed correct patient, procedure, site, and patient consented.   Anesthesia Topical anesthesia was used. Anesthetic medications included Lidocaine 2%, Proparacaine 0.5%.   Procedure Preparation included 5% betadine to ocular surface, eyelid speculum. A 30 gauge needle was used.   Injection:  2 mg aflibercept Alfonse Flavors) SOLN   NDC: O5083423, Lot: [id number], Expiration date: 09/29/2019   Route: Intravitreal, Site: Left Eye, Waste: 0.05 mL  Post-op Post injection exam found visual acuity of at least counting fingers. The patient tolerated the procedure well. There were no complications. The patient received written and verbal post procedure care education.                 ASSESSMENT/PLAN:    ICD-10-CM   1. Severe nonproliferative diabetic retinopathy of both eyes with macular edema associated with type 2 diabetes mellitus (HCC)  [id number] Intravitreal Injection, Pharmacologic Agent - OD - Right Eye    Intravitreal Injection, Pharmacologic Agent - OS - Left Eye    aflibercept (EYLEA) SOLN 2 mg    aflibercept (EYLEA) SOLN 2 mg  2. Retinal edema  H35.81 OCT, Retina - OU - Both Eyes  3. Essential hypertension  I10   4. Hypertensive retinopathy of both eyes  H35.033   5. Combined forms of age-related cataract of both eyes  H25.813     1, 2. Severe Non-proliferative diabetic retinopathy, both eyes  - initial exam showed massive central DME OU and scattered IRH and IRMA  - FA 6.5.19 with  patches of capillary nonperfusion; extensive Mas with late leakage OU; no frank NV  - FA 01.02.20,  shows improvement in late leaking microaneurysms OU  - S/P IVA OS #1 (06.05.19), #2 (07.08.19), #3 (07.08.19), #4 (09.03.19)  - S/P IVA OD #1 (06.07.19), #2 (07.08.19), #3 (07.08.19)  - S/P IVE OD #1 (09.03.19), #2 (10.03.19), #3 (11.04.19), #4 (12.02.19), #5 (01.02.20), #6 (02.07.20), #7 (03.16.20), #8 (05.20.20), #9 (06.22.20), #10 (07.27.20), # 11(09.09.10), # 12 (10.28.20)  - S/P IVE OS #1 (10.03.19), #2 (11.04.19), #3 (12.02.19), #4 (01.02.20), #5 (02.07.20), #6 (03.16.20), #7 (05.20.20), #8 (06.22.20),#9 (07.27.20), #10 (09.09.20), # 11 (10.28.20)  - OCT shows interval improvement in ST IRF/DME OD, persistent IRF/DME OS  - BCVA stable OD at 20/30 and stable OS at 20/25  - discussed findings and OCT  - recommend IVE OU today (11.25.20) -- OD #13, OS #12  - pt wishes to proceed  - RBA of procedure discussed, questions answered  - informed consent obtained and signed  - see procedure note -- tolerated well  - Eylea paperwork and benefits investigation started on 08.05.19 -- approved w/ Commercial assistance program -- approved for 2020  - f/u in 4 weeks -- DFE/OCT/possible IVE  3,4. Hypertensive retinopathy OU  - discussed importance of tight BP control  - has had some Bps in hypertensive emergency range (200s / 110s) -- today pt reports significant improvement  - discussed likely contribution to DME  - pt reports new diagnosis of renal insufficiency/failure which is complicating his HTN  - monitor             - BP management per nephrology, PCP and cardiology  5. Combined form age-related cataract OU-   - The symptoms of cataract, surgical options, and treatments and risks were discussed with patient.  - discussed diagnosis and progression  - not yet visually significant  - monitor for now   Ophthalmic Meds Ordered this visit:  Meds ordered this encounter  Medications  .  aflibercept (EYLEA) SOLN 2 mg  . aflibercept (EYLEA) SOLN 2 mg       Return in about 4 weeks (around 03/27/2019) for DFE, OCT.  There are no Patient Instructions on file for this visit.   Explained the diagnoses, plan, and follow up with the patient and they expressed understanding.  Patient expressed understanding of the importance of proper follow up care.   This document serves as a record of services personally performed by Gardiner Sleeper, MD, PhD. It was created on their behalf by Roselee Nova, COMT. The creation of this record is the provider's dictation and/or activities during the visit.  Electronically signed by: Roselee Nova, COMT 02/27/19 11:57 AM  Gardiner Sleeper, M.D., Ph.D. Diseases & Surgery of the Retina and [place] 02/27/2019    I have reviewed the above documentation for accuracy and completeness, and I agree with the above. Gardiner Sleeper, M.D., Ph.D. 02/27/19 11:57 AM    Abbreviations: M myopia (nearsighted); A astigmatism; H hyperopia (farsighted); P presbyopia; Mrx spectacle prescription;  CTL contact lenses; OD right eye; OS left eye; OU both eyes  XT exotropia; ET esotropia; PEK punctate epithelial keratitis; PEE punctate epithelial erosions; DES dry eye syndrome; MGD meibomian gland dysfunction; ATs artificial tears; PFAT's preservative free artificial tears; [place] nuclear sclerotic cataract; PSC posterior subcapsular cataract; ERM epi-retinal membrane; PVD posterior vitreous detachment; RD retinal detachment; DM diabetes mellitus; DR diabetic retinopathy; NPDR non-proliferative diabetic retinopathy; PDR proliferative diabetic retinopathy; CSME clinically significant macular edema; DME diabetic macular edema; dbh dot blot hemorrhages; CWS cotton wool spot; POAG primary  open angle glaucoma; C/D cup-to-disc ratio; HVF humphrey visual field; GVF goldmann visual field; OCT optical coherence tomography; IOP intraocular pressure; BRVO  Branch retinal vein occlusion; CRVO central retinal vein occlusion; CRAO central retinal artery occlusion; BRAO branch retinal artery occlusion; RT retinal tear; SB scleral buckle; PPV pars plana vitrectomy; VH Vitreous hemorrhage; PRP panretinal laser photocoagulation; IVK intravitreal kenalog; VMT vitreomacular traction; MH Macular hole;  NVD neovascularization of the disc; NVE neovascularization elsewhere; AREDS age related eye disease study; ARMD age related macular degeneration; POAG primary open angle glaucoma; EBMD epithelial/anterior basement membrane dystrophy; ACIOL anterior chamber intraocular lens; IOL intraocular lens; PCIOL posterior chamber intraocular lens; Phaco/IOL phacoemulsification with intraocular lens placement; [place] photorefractive keratectomy; LASIK laser assisted in situ keratomileusis; HTN hypertension; DM diabetes mellitus; COPD chronic obstructive pulmonary disease

## 2019-02-27 ENCOUNTER — Other Ambulatory Visit: Payer: Self-pay

## 2019-02-27 ENCOUNTER — Encounter (INDEPENDENT_AMBULATORY_CARE_PROVIDER_SITE_OTHER): Payer: BC Managed Care – PPO | Admitting: Ophthalmology

## 2019-02-27 ENCOUNTER — Ambulatory Visit (INDEPENDENT_AMBULATORY_CARE_PROVIDER_SITE_OTHER): Payer: BC Managed Care – PPO | Admitting: Ophthalmology

## 2019-02-27 ENCOUNTER — Encounter (INDEPENDENT_AMBULATORY_CARE_PROVIDER_SITE_OTHER): Payer: Self-pay | Admitting: Ophthalmology

## 2019-02-27 DIAGNOSIS — H3581 Retinal edema: Secondary | ICD-10-CM

## 2019-02-27 DIAGNOSIS — H35033 Hypertensive retinopathy, bilateral: Secondary | ICD-10-CM

## 2019-02-27 DIAGNOSIS — E113413 Type 2 diabetes mellitus with severe nonproliferative diabetic retinopathy with macular edema, bilateral: Secondary | ICD-10-CM

## 2019-02-27 DIAGNOSIS — H25813 Combined forms of age-related cataract, bilateral: Secondary | ICD-10-CM

## 2019-02-27 DIAGNOSIS — I10 Essential (primary) hypertension: Secondary | ICD-10-CM | POA: Diagnosis not present

## 2019-02-27 MED ORDER — AFLIBERCEPT 2MG/0.05ML IZ SOLN FOR KALEIDOSCOPE
2.0000 mg | INTRAVITREAL | Status: AC | PRN
  Administered 2019-02-27: 2 mg via INTRAVITREAL

## 2019-03-27 ENCOUNTER — Encounter (INDEPENDENT_AMBULATORY_CARE_PROVIDER_SITE_OTHER): Payer: BC Managed Care – PPO | Admitting: Ophthalmology

## 2019-03-27 DIAGNOSIS — Z5181 Encounter for therapeutic drug level monitoring: Secondary | ICD-10-CM | POA: Insufficient documentation

## 2019-03-27 DIAGNOSIS — Z79621 Long term (current) use of calcineurin inhibitor: Secondary | ICD-10-CM | POA: Insufficient documentation

## 2019-04-02 HISTORY — PX: KIDNEY TRANSPLANT: SHX239

## 2019-04-08 DIAGNOSIS — D849 Immunodeficiency, unspecified: Secondary | ICD-10-CM | POA: Insufficient documentation

## 2019-04-08 DIAGNOSIS — Z940 Kidney transplant status: Secondary | ICD-10-CM | POA: Insufficient documentation

## 2019-06-02 ENCOUNTER — Other Ambulatory Visit: Payer: Self-pay | Admitting: Legal Medicine

## 2019-06-17 DIAGNOSIS — Z794 Long term (current) use of insulin: Secondary | ICD-10-CM | POA: Insufficient documentation

## 2019-06-17 DIAGNOSIS — E119 Type 2 diabetes mellitus without complications: Secondary | ICD-10-CM | POA: Insufficient documentation

## 2019-07-09 ENCOUNTER — Ambulatory Visit (INDEPENDENT_AMBULATORY_CARE_PROVIDER_SITE_OTHER): Payer: BC Managed Care – PPO | Admitting: Ophthalmology

## 2019-07-09 ENCOUNTER — Other Ambulatory Visit: Payer: Self-pay

## 2019-07-09 ENCOUNTER — Encounter (INDEPENDENT_AMBULATORY_CARE_PROVIDER_SITE_OTHER): Payer: Self-pay | Admitting: Ophthalmology

## 2019-07-09 DIAGNOSIS — H35033 Hypertensive retinopathy, bilateral: Secondary | ICD-10-CM | POA: Diagnosis not present

## 2019-07-09 DIAGNOSIS — E113413 Type 2 diabetes mellitus with severe nonproliferative diabetic retinopathy with macular edema, bilateral: Secondary | ICD-10-CM

## 2019-07-09 DIAGNOSIS — I10 Essential (primary) hypertension: Secondary | ICD-10-CM | POA: Diagnosis not present

## 2019-07-09 DIAGNOSIS — H3581 Retinal edema: Secondary | ICD-10-CM

## 2019-07-09 DIAGNOSIS — H25813 Combined forms of age-related cataract, bilateral: Secondary | ICD-10-CM

## 2019-07-09 MED ORDER — AFLIBERCEPT 2MG/0.05ML IZ SOLN FOR KALEIDOSCOPE
2.0000 mg | INTRAVITREAL | Status: AC | PRN
  Administered 2019-07-09: 2 mg via INTRAVITREAL

## 2019-07-09 NOTE — Progress Notes (Signed)
[facility] Note  07/09/2019     CHIEF COMPLAINT Patient presents for Retina Follow Up   HISTORY OF PRESENT ILLNESS: Philip Wilson is a 49 y.o. male who presents to the clinic today for:   HPI    Retina Follow Up    Patient presents with  Diabetic Retinopathy.  In both eyes.  This started months ago.  Severity is moderate.  Duration of 4.5 months.  Since onset it is stable.  I, the attending physician,  performed the HPI with the patient and updated documentation appropriately.          Comments    48 y/o male pt here for 4.5 mo f/u for NPDR w/mac edema OU.  Feels VA OU may be gradually getting worse.  Had kidney transplant in 03/2019, and was told that may impact his vision some.  Denies pain, FOL, floaters.  No gtts.  BS 165 this a.m.  A1C 6.0.       Last edited by Bernarda Caffey, MD on 07/09/2019  3:27 PM. (History)    pt had a kidney transplant in December, he said he had a small set back with an infection, but everything is fine now, he states his blood pressure has been "perfect" since the transplant, he states he is on insulin now, he states he has been cleared by his drs to resume injections  Referring physician: Lillard Anes, MD [address] [place],  [place] [address]  HISTORICAL INFORMATION:   Selected notes from the MEDICAL RECORD NUMBER Referred by Dr. Ricki Paff for diabetic retinopathy OU    CURRENT MEDICATIONS: No current outpatient medications on file. (Ophthalmic Drugs)   Current Facility-Administered Medications (Ophthalmic Drugs)  Medication Route  . aflibercept (EYLEA) SOLN 2 mg Intravitreal  . aflibercept (EYLEA) SOLN 2 mg Intravitreal  . aflibercept (EYLEA) SOLN 2 mg Intravitreal  . aflibercept (EYLEA) SOLN 2 mg Intravitreal  . aflibercept (EYLEA) SOLN 2 mg Intravitreal  . aflibercept (EYLEA) SOLN 2 mg Intravitreal  . aflibercept (EYLEA) SOLN 2 mg Intravitreal  . aflibercept (EYLEA) SOLN 2 mg  Intravitreal  . aflibercept (EYLEA) SOLN 2 mg Intravitreal  . aflibercept (EYLEA) SOLN 2 mg Intravitreal  . aflibercept (EYLEA) SOLN 2 mg Intravitreal   Current Outpatient Medications (Other)  Medication Sig  . ACCU-CHEK GUIDE test strip every morning. Use to test fasting blood glucose  . AgaMatrix Ultra-Thin Lancets MISC 200 each by Misc.(Non-Drug; Combo Route) route 4 times daily. One touch delica  . aspirin 81 MG EC tablet Take by mouth.  Marland Kitchen atorvastatin (LIPITOR) 40 MG tablet TAKE 1 TABLET BY MOUTH DAILY  . B-D UF III MINI PEN NEEDLES 31G X 5 MM MISC   . Blood Glucose Monitoring Suppl (FIFTY50 GLUCOSE METER 2.0) w/Device KIT 1 each by Other route 4 times daily. Use as instructed one touch ultra  . cloNIDine (CATAPRES) 0.1 MG tablet Take 0.1 mg by mouth 2 (two) times daily.   . dapsone 25 MG tablet Take 50 mg by mouth daily.  . fluconazole (DIFLUCAN) 50 MG tablet   . furosemide (LASIX) 40 MG tablet Take 40 mg by mouth daily.  Marland Kitchen glimepiride (AMARYL) 4 MG tablet Take 4 mg by mouth daily with breakfast.   . HYDROcodone-acetaminophen (NORCO/VICODIN) 5-325 MG tablet Take 1 tablet by mouth every 4 (four) hours as needed for moderate pain.  . Insulin Glargine (BASAGLAR KWIKPEN) 100 UNIT/ML Inject into the skin.  Marland Kitchen insulin lispro (  HUMALOG) 100 UNIT/ML injection Inject 5 units with breakfast and dinner and 4 units with lunch plus sliding scale as follows: BG 100-150 (1 unit); 151-200 (3 unit); 201-250 (4 unit); 251-300 (5 unit); 251-300 (6 unit); 351-400 (8 unit)  . K Phos Mono-Sod Phos Di & Mono (PHOSPHA 250 NEUTRAL) 155-852-130 MG TABS   . labetalol (NORMODYNE) 200 MG tablet Take 200 mg by mouth 3 (three) times daily.   Marland Kitchen lisinopril (PRINIVIL,ZESTRIL) 20 MG tablet Take 20 mg by mouth 2 (two) times a day.   . magnesium oxide (MAG-OX) 400 MG tablet Take 1 tablet by mouth 2 (two) times daily.  . metFORMIN (GLUCOPHAGE) 1000 MG tablet Take 1,000 mg by mouth daily.   . Multiple Vitamin (MULTIVITAMIN  WITH MINERALS) TABS tablet Take 1 tablet by mouth daily.  . Multiple Vitamin (MULTIVITAMIN) capsule Take by mouth.  . mycophenolate (MYFORTIC) 180 MG EC tablet Take by mouth.  Marland Kitchen NIFEdipine (PROCARDIA-XL/NIFEDICAL-XL) 30 MG 24 hr tablet Take 30 mg by mouth daily.  Marland Kitchen NOVOLOG FLEXPEN 100 UNIT/ML FlexPen   . pantoprazole (PROTONIX) 40 MG tablet Take by mouth.  . predniSONE (DELTASONE) 5 MG tablet Take by mouth.  . sodium bicarbonate 650 MG tablet Take by mouth.  . sulfamethoxazole-trimethoprim (BACTRIM) 400-80 MG tablet   . Tacrolimus ER 1 MG TB24 Take by mouth.  . valGANciclovir (VALCYTE) 450 MG tablet Take by mouth.   Current Facility-Administered Medications (Other)  Medication Route  . Bevacizumab (AVASTIN) SOLN 1.25 mg Intravitreal  . Bevacizumab (AVASTIN) SOLN 1.25 mg Intravitreal  . Bevacizumab (AVASTIN) SOLN 1.25 mg Intravitreal  . Bevacizumab (AVASTIN) SOLN 1.25 mg Intravitreal  . Bevacizumab (AVASTIN) SOLN 1.25 mg Intravitreal  . Bevacizumab (AVASTIN) SOLN 1.25 mg Intravitreal  . Bevacizumab (AVASTIN) SOLN 1.25 mg Intravitreal      REVIEW OF SYSTEMS: ROS    Positive for: Neurological, Genitourinary, Endocrine, Eyes   Negative for: Constitutional, Gastrointestinal, Skin, Musculoskeletal, HENT, Cardiovascular, Respiratory, Psychiatric, Allergic/Imm, Heme/Lymph   Last edited by Matthew Folks, COA on 07/09/2019  2:47 PM. (History)       ALLERGIES No Known Allergies  PAST MEDICAL HISTORY Past Medical History:  Diagnosis Date  . Cataract    OU  . Chronic kidney disease 05/04/2018  . Diabetes 1.5, managed as type 2 ([place])   . Diabetic retinopathy ([place])    NPDR OU  . Hypertension   . Hypertensive retinopathy    OU   Past Surgical History:  Procedure Laterality Date  . AV FISTULA PLACEMENT Left 09/28/2018   Procedure: Creation of Left arm Radiocephalic ARTERIOVENOUS (AV) FISTULA;  Surgeon: Marty Heck, MD;  Location: [facility];  Service: Vascular;  Laterality:  Left;  Marland Kitchen MULTIPLE TOOTH EXTRACTIONS      FAMILY HISTORY Family History  Problem Relation Age of Onset  . Heart failure Mother     SOCIAL HISTORY Social History   Tobacco Use  . Smoking status: Former Research scientist (life sciences)  . Smokeless tobacco: Never Used  . Tobacco comment: quit 20 years ago  Substance Use Topics  . Alcohol use: Not Currently  . Drug use: Never         OPHTHALMIC EXAM:  Base Eye Exam    Visual Acuity (Snellen - Linear)      Right Left   Dist [place] 20/40 + 20/30 -2   Dist ph [place] NI 20/25 -2       Tonometry (Tonopen, 2:52 PM)      Right Left   Pressure 16 16  Pupils      Dark Light Shape React APD   Right 3 2 Round Brisk None   Left 3 2 Round Brisk None       Visual Fields (Counting fingers)      Left Right    Full Full       Extraocular Movement      Right Left    Full, Ortho Full, Ortho       Neuro/Psych    Oriented x3: Yes   Mood/Affect: Normal       Dilation    Both eyes: 1.0% Mydriacyl, 2.5% Phenylephrine @ 2:52 PM        Slit Lamp and Fundus Exam    Slit Lamp Exam      Right Left   Lids/Lashes Dermatochalasis - upper lid, mild Meibomian gland dysfunction Dermatochalasis - upper lid, mild Meibomian gland dysfunction   Conjunctiva/Sclera White and quiet White and quiet   Cornea Clear Clear   Anterior Chamber Deep and quiet, no cell or flare Deep and quiet, no cell or flare   Iris Round and dilated to 64m Round and dilated to 786m  Lens 1+ Nuclear sclerosis, 2+ Cortical cataract 1+ Nuclear sclerosis, 2+ Cortical cataract   Vitreous Mild Vitreous syneresis Mild Vitreous syneresis, old white VH inferiorly,       Fundus Exam      Right Left   Disc Pink and Sharp Pink and Sharp   C/D Ratio 0.2 0.2   Macula blunted foveal reflex, +ERM, +edema -- slightly increased from prior good foveal reflex, mild focal exudates superior to fovea, scattered MA, mild cystic changes superior macula   Vessels Tortuous, AV crossing changes, Vascular  attenuation Tortuous, AV crossing changes   Periphery Attached, scattered MAs and DBH - mostly posterior Attached, scattered MAs and DBH -- mostly posterior -- all improved--very minimal now, Pigmentation          IMAGING AND PROCEDURES  Imaging and Procedures for _0 @  OCT, Retina - OU - Both Eyes       Right Eye Quality was good. Central Foveal Thickness: 472. Progression has worsened. Findings include intraretinal fluid, outer retinal atrophy, retinal drusen , intraretinal hyper-reflective material, no SRF, abnormal foveal contour, epiretinal membrane (interval increase in IRF/DME, +ERM).   Left Eye Quality was good. Central Foveal Thickness: 273. Progression has improved. Findings include intraretinal fluid, epiretinal membrane, no SRF, intraretinal hyper-reflective material, abnormal foveal contour (Mild interval improvement in IRF).   Notes *Images captured and stored on drive  Diagnosis / Impression:  DME OU OD: interval increase in IRF/DME, +ERM OS: Mild interval improvement in IRF  Clinical management:  See below  Abbreviations: NFP - Normal foveal profile. CME - cystoid macular edema. PED - pigment epithelial detachment. IRF - intraretinal fluid. SRF - subretinal fluid. EZ - ellipsoid zone. ERM - epiretinal membrane. ORA - outer retinal atrophy. ORT - outer retinal tubulation. SRHM - subretinal hyper-reflective material        Intravitreal Injection, Pharmacologic Agent - OD - Right Eye       Time Out 07/09/2019. 4:11 PM. Confirmed correct patient, procedure, site, and patient consented.   Anesthesia Topical anesthesia was used. Anesthetic medications included Proparacaine 0.5%.   Procedure Preparation included 5% betadine to ocular surface. A (32g) needle was used.   Injection:  2 mg aflibercept (EAlfonse FlavorsSOLN   NDC: 6163016-010-93Lot: [id number] date: 12/30/2019   Route: Intravitreal, Site: Right Eye, Waste: 0.05 mL  Post-op Post injection  exam found visual acuity of at least counting fingers. The patient tolerated the procedure well. There were no complications. The patient received written and verbal post procedure care education.   Notes **SAMPLE MEDICATION ADMINISTERED**                ASSESSMENT/PLAN:    ICD-10-CM   1. 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 Retinal edema  H35.81 OCT, Retina - OU - Both Eyes  3. Essential hypertension  I10   4. Hypertensive retinopathy of both eyes  H35.033   5. Combined forms of age-related cataract of both eyes  H25.813     1, 2. Severe Non-proliferative diabetic retinopathy, both eyes  - pt is delayed to follow up - 5 months instead of 4 weeks due to having a kidney transplant in December 2020  - initial exam showed massive central DME OU and scattered IRH and IRMA  - FA 6.5.19 with patches of capillary nonperfusion; extensive Mas with late leakage OU; no frank NV  - FA 01.02.20, shows improvement in late leaking microaneurysms OU  - S/P IVA OS #1 (06.05.19), #2 (07.08.19), #3 (07.08.19), #4 (09.03.19)  - S/P IVA OD #1 (06.07.19), #2 (07.08.19), #3 (07.08.19)  - S/P IVE OD #1 (09.03.19), #2 (10.03.19), #3 (11.04.19), #4 (12.02.19), #5 (01.02.20), #6 (02.07.20), #7 (03.16.20), #8 (05.20.20), #9 (06.22.20), #10 (07.27.20), # 11(09.09.10), # 12 (10.28.20), #13 (11.25.20)  - S/P IVE OS #1 (10.03.19), #2 (11.04.19), #3 (12.02.19), #4 (01.02.20), #5 (02.07.20), #6 (03.16.20), #7 (05.20.20), #8 (06.22.20),#9 (07.27.20), #10 (09.09.20), # 11 (10.28.20), #12 (11.25.20)  - OCT shows OD: interval increase in IRF/DME, +ERM; OS: mild interval improvement in IRF  - BCVA decreased OD to 20/40 from at 20/30 and stable OS at 20/25  - discussed findings and OCT  - recommend IVE OD #14 today, 04.06.21 --  sample, as Eylea benefits have not been verified for 2021  - will hold off on injection OS today  - pt in agreement  - RBA of procedure discussed, questions answered  - informed consent obtained and signed  - see procedure note -- tolerated well  - Eylea paperwork and benefits investigation started on 08.05.19 -- approval for 2021 pending  - f/u in 4 weeks -- DFE/OCT/possible IVE  3,4. Hypertensive retinopathy OU  - discussed importance of tight BP control  - has had some Bps in hypertensive emergency range (200s / 110s)  - BP improved post kidney transplant  - discussed likely contribution to DME  - monitor             - BP management per nephrology, PCP and cardiology  5. Combined form age-related cataract OU-   - The symptoms of cataract, surgical options, and treatments and risks were discussed with patient.  - discussed diagnosis and progression  - not yet visually significant  - monitor for now   Ophthalmic Meds Ordered this visit:  Meds ordered this encounter  Medications  . aflibercept (EYLEA) SOLN 2 mg       Return in about 4 weeks (around 08/06/2019) for f/u NPDR OU, DFE, OCT.  There are no Patient Instructions on file for this visit.   Explained the diagnoses, plan, and follow up with the patient and they expressed understanding.  Patient expressed understanding of the importance of proper follow up  care.   This document serves as a record of services personally performed by Gardiner Sleeper, MD, PhD. It was created on their behalf by Estill Bakes, COT an ophthalmic technician. The creation of this record is the provider's dictation and/or activities during the visit.    Electronically signed by: Estill Bakes, COT 07/09/19 @ 9:56 PM  Gardiner Sleeper, M.D., Ph.D. Diseases & Surgery of the Retina and [place] 07/09/2019   I have reviewed the above documentation for accuracy and completeness, and I agree with the above. Gardiner Sleeper, M.D., Ph.D. 07/09/19 9:56 PM   Abbreviations: M myopia (nearsighted); A astigmatism; H hyperopia (farsighted); P presbyopia; Mrx spectacle prescription;  CTL contact lenses; OD right eye; OS left eye; OU both eyes  XT exotropia; ET esotropia; PEK punctate epithelial keratitis; PEE punctate epithelial erosions; DES dry eye syndrome; MGD meibomian gland dysfunction; ATs artificial tears; PFAT's preservative free artificial tears; [place] nuclear sclerotic cataract; PSC posterior subcapsular cataract; ERM epi-retinal membrane; PVD posterior vitreous detachment; RD retinal detachment; DM diabetes mellitus; DR diabetic retinopathy; NPDR non-proliferative diabetic retinopathy; PDR proliferative diabetic retinopathy; CSME clinically significant macular edema; DME diabetic macular edema; dbh dot blot hemorrhages; CWS cotton wool spot; POAG primary open angle glaucoma; C/D cup-to-disc ratio; HVF humphrey visual field; GVF goldmann visual field; OCT optical coherence tomography; IOP intraocular pressure; BRVO Branch retinal vein occlusion; CRVO central retinal vein occlusion; CRAO central retinal artery occlusion; BRAO branch retinal artery occlusion; RT retinal tear; SB scleral buckle; PPV pars plana vitrectomy; VH Vitreous hemorrhage; PRP panretinal laser photocoagulation; IVK intravitreal kenalog; VMT vitreomacular traction; MH Macular hole;  NVD neovascularization of the disc; NVE neovascularization elsewhere; AREDS age related eye disease study; ARMD age related macular degeneration; POAG primary open angle glaucoma; EBMD epithelial/anterior basement membrane dystrophy; ACIOL anterior chamber intraocular lens; IOL intraocular lens; PCIOL posterior chamber intraocular lens; Phaco/IOL phacoemulsification with intraocular lens placement; [place] photorefractive keratectomy; LASIK laser assisted in situ keratomileusis; HTN hypertension; DM diabetes mellitus; COPD chronic obstructive pulmonary disease

## 2019-07-18 DIAGNOSIS — K118 Other diseases of salivary glands: Secondary | ICD-10-CM

## 2019-07-18 HISTORY — DX: Other diseases of salivary glands: K11.8

## 2019-08-06 ENCOUNTER — Ambulatory Visit (INDEPENDENT_AMBULATORY_CARE_PROVIDER_SITE_OTHER): Payer: BC Managed Care – PPO | Admitting: Ophthalmology

## 2019-08-06 ENCOUNTER — Encounter (INDEPENDENT_AMBULATORY_CARE_PROVIDER_SITE_OTHER): Payer: Self-pay | Admitting: Ophthalmology

## 2019-08-06 DIAGNOSIS — H35033 Hypertensive retinopathy, bilateral: Secondary | ICD-10-CM

## 2019-08-06 DIAGNOSIS — B348 Other viral infections of unspecified site: Secondary | ICD-10-CM | POA: Insufficient documentation

## 2019-08-06 DIAGNOSIS — H25813 Combined forms of age-related cataract, bilateral: Secondary | ICD-10-CM

## 2019-08-06 DIAGNOSIS — I10 Essential (primary) hypertension: Secondary | ICD-10-CM

## 2019-08-06 DIAGNOSIS — H3581 Retinal edema: Secondary | ICD-10-CM

## 2019-08-06 DIAGNOSIS — E113413 Type 2 diabetes mellitus with severe nonproliferative diabetic retinopathy with macular edema, bilateral: Secondary | ICD-10-CM | POA: Diagnosis not present

## 2019-08-06 MED ORDER — AFLIBERCEPT 2MG/0.05ML IZ SOLN FOR KALEIDOSCOPE
2.0000 mg | INTRAVITREAL | Status: AC | PRN
  Administered 2019-08-06: 2 mg via INTRAVITREAL

## 2019-08-06 NOTE — Progress Notes (Signed)
[facility] Note  08/06/2019     CHIEF COMPLAINT Patient presents for Retina Follow Up   HISTORY OF PRESENT ILLNESS: Philip Wilson is a 49 y.o. male who presents to the clinic today for:   HPI    Retina Follow Up    Patient presents with  Diabetic Retinopathy.  In both eyes.  Severity is severe.  Duration of 4 weeks.  Since onset it is stable.  I, the attending physician,  performed the HPI with the patient and updated documentation appropriately.          Comments    4 week Exu eval OU>OS.  Patient states no new problems.  VA stable, it did decrease after kidney transplant but stable at this time OU.        Last edited by Bernarda Caffey, MD on 08/06/2019  4:36 PM. (History)    pt states vision is "about the same"   Referring physician: Lillard Anes, MD [address] [place],  [place] [address]  HISTORICAL INFORMATION:   Selected notes from the MEDICAL RECORD NUMBER Referred by Dr. Ricki Raphael for diabetic retinopathy OU    CURRENT MEDICATIONS: No current outpatient medications on file. (Ophthalmic Drugs)   Current Facility-Administered Medications (Ophthalmic Drugs)  Medication Route  . aflibercept (EYLEA) SOLN 2 mg Intravitreal  . aflibercept (EYLEA) SOLN 2 mg Intravitreal  . aflibercept (EYLEA) SOLN 2 mg Intravitreal  . aflibercept (EYLEA) SOLN 2 mg Intravitreal  . aflibercept (EYLEA) SOLN 2 mg Intravitreal  . aflibercept (EYLEA) SOLN 2 mg Intravitreal  . aflibercept (EYLEA) SOLN 2 mg Intravitreal  . aflibercept (EYLEA) SOLN 2 mg Intravitreal  . aflibercept (EYLEA) SOLN 2 mg Intravitreal  . aflibercept (EYLEA) SOLN 2 mg Intravitreal  . aflibercept (EYLEA) SOLN 2 mg Intravitreal   Current Outpatient Medications (Other)  Medication Sig  . ACCU-CHEK GUIDE test strip every morning. Use to test fasting blood glucose  . AgaMatrix Ultra-Thin Lancets MISC 200 each by Misc.(Non-Drug; Combo Route) route 4 times daily.  One touch delica  . aspirin 81 MG EC tablet Take by mouth.  Marland Kitchen atorvastatin (LIPITOR) 40 MG tablet TAKE 1 TABLET BY MOUTH DAILY  . B-D UF III MINI PEN NEEDLES 31G X 5 MM MISC   . Blood Glucose Monitoring Suppl (FIFTY50 GLUCOSE METER 2.0) w/Device KIT 1 each by Other route 4 times daily. Use as instructed one touch ultra  . cloNIDine (CATAPRES) 0.1 MG tablet Take 0.1 mg by mouth 2 (two) times daily.   . dapsone 25 MG tablet Take 50 mg by mouth daily.  . fluconazole (DIFLUCAN) 50 MG tablet   . furosemide (LASIX) 40 MG tablet Take 40 mg by mouth daily.  Marland Kitchen glimepiride (AMARYL) 4 MG tablet Take 4 mg by mouth daily with breakfast.   . HYDROcodone-acetaminophen (NORCO/VICODIN) 5-325 MG tablet Take 1 tablet by mouth every 4 (four) hours as needed for moderate pain.  . Insulin Glargine (BASAGLAR KWIKPEN) 100 UNIT/ML Inject into the skin.  Marland Kitchen insulin lispro (HUMALOG) 100 UNIT/ML injection Inject 5 units with breakfast and dinner and 4 units with lunch plus sliding scale as follows: BG 100-150 (1 unit); 151-200 (3 unit); 201-250 (4 unit); 251-300 (5 unit); 251-300 (6 unit); 351-400 (8 unit)  . K Phos Mono-Sod Phos Di & Mono (PHOSPHA 250 NEUTRAL) 155-852-130 MG TABS   . labetalol (NORMODYNE) 200 MG tablet Take 200 mg by mouth 3 (three) times daily.   Marland Kitchen lisinopril (PRINIVIL,ZESTRIL)  20 MG tablet Take 20 mg by mouth 2 (two) times a day.   . magnesium oxide (MAG-OX) 400 MG tablet Take 1 tablet by mouth 2 (two) times daily.  . metFORMIN (GLUCOPHAGE) 1000 MG tablet Take 1,000 mg by mouth daily.   . Multiple Vitamin (MULTIVITAMIN WITH MINERALS) TABS tablet Take 1 tablet by mouth daily.  . Multiple Vitamin (MULTIVITAMIN) capsule Take by mouth.  . mycophenolate (MYFORTIC) 180 MG EC tablet Take by mouth.  Marland Kitchen NIFEdipine (PROCARDIA-XL/NIFEDICAL-XL) 30 MG 24 hr tablet Take 30 mg by mouth daily.  Marland Kitchen NOVOLOG FLEXPEN 100 UNIT/ML FlexPen   . pantoprazole (PROTONIX) 40 MG tablet Take by mouth.  . predniSONE (DELTASONE) 5 MG  tablet Take by mouth.  . sodium bicarbonate 650 MG tablet Take by mouth.  . sulfamethoxazole-trimethoprim (BACTRIM) 400-80 MG tablet   . Tacrolimus ER 1 MG TB24 Take by mouth.  . valGANciclovir (VALCYTE) 450 MG tablet Take by mouth.   Current Facility-Administered Medications (Other)  Medication Route  . Bevacizumab (AVASTIN) SOLN 1.25 mg Intravitreal  . Bevacizumab (AVASTIN) SOLN 1.25 mg Intravitreal  . Bevacizumab (AVASTIN) SOLN 1.25 mg Intravitreal  . Bevacizumab (AVASTIN) SOLN 1.25 mg Intravitreal  . Bevacizumab (AVASTIN) SOLN 1.25 mg Intravitreal  . Bevacizumab (AVASTIN) SOLN 1.25 mg Intravitreal  . Bevacizumab (AVASTIN) SOLN 1.25 mg Intravitreal      REVIEW OF SYSTEMS: ROS    Positive for: Neurological, Genitourinary, Endocrine, Eyes   Negative for: Constitutional, Gastrointestinal, Skin, Musculoskeletal, HENT, Cardiovascular, Respiratory, Psychiatric, Allergic/Imm, Heme/Lymph   Last edited by Leonie Douglas, COA on 08/06/2019  2:18 PM. (History)       ALLERGIES No Known Allergies  PAST MEDICAL HISTORY Past Medical History:  Diagnosis Date  . Cataract    OU  . Chronic kidney disease 05/04/2018  . Diabetes 1.5, managed as type 2 ([place])   . Diabetic retinopathy ([place])    NPDR OU  . Hypertension   . Hypertensive retinopathy    OU   Past Surgical History:  Procedure Laterality Date  . AV FISTULA PLACEMENT Left 09/28/2018   Procedure: Creation of Left arm Radiocephalic ARTERIOVENOUS (AV) FISTULA;  Surgeon: Marty Heck, MD;  Location: [facility];  Service: Vascular;  Laterality: Left;  Marland Kitchen MULTIPLE TOOTH EXTRACTIONS      FAMILY HISTORY Family History  Problem Relation Age of Onset  . Heart failure Mother     SOCIAL HISTORY Social History   Tobacco Use  . Smoking status: Former Research scientist (life sciences)  . Smokeless tobacco: Never Used  . Tobacco comment: quit 20 years ago  Substance Use Topics  . Alcohol use: Not Currently  . Drug use: Never         OPHTHALMIC  EXAM:  Base Eye Exam    Visual Acuity (Snellen - Linear)      Right Left   Dist [place] 20/50 +1 20/40 +2   Dist ph [place] 20/40 +1 20/25 +2       Tonometry (Tonopen, 2:25 PM)      Right Left   Pressure 18 20       Pupils      Dark Light Shape React APD   Right 3 2 Round Brisk None   Left 3 2 Round Brisk None       Visual Fields      Left Right    Full Full       Extraocular Movement      Right Left    Full Full  Neuro/Psych    Oriented x3: Yes   Mood/Affect: Normal       Dilation    Both eyes: 1.0% Mydriacyl, 2.5% Phenylephrine @ 2:25 PM        Slit Lamp and Fundus Exam    Slit Lamp Exam      Right Left   Lids/Lashes Dermatochalasis - upper lid, mild Meibomian gland dysfunction Dermatochalasis - upper lid, mild Meibomian gland dysfunction   Conjunctiva/Sclera White and quiet White and quiet   Cornea Clear Clear   Anterior Chamber Deep and quiet, no cell or flare Deep and quiet, no cell or flare   Iris Round and dilated to 49m Round and dilated to 724m  Lens 1+ Nuclear sclerosis, 2+ Cortical cataract 1+ Nuclear sclerosis, 2+ Cortical cataract   Vitreous Mild Vitreous syneresis, Posterior vitreous detachment, Weiss ring Mild Vitreous syneresis, old white VH inferiorly       Fundus Exam      Right Left   Disc Pink and Sharp Pink and Sharp   C/D Ratio 0.2 0.3   Macula blunted foveal reflex, +ERM, +edema -- slightly improved from prior good foveal reflex, mild focal exudates superior to fovea - improving, scattered MA, mild cystic changes superior macula - improving   Vessels Tortuous, AV crossing changes, Vascular attenuation Tortuous, AV crossing changes   Periphery Attached, scattered MAs and DBH - mostly posterior, No RT/RD Attached, scattered MAs and DBH -- mostly posterior -- all improved--very minimal now, Pigmentation          IMAGING AND PROCEDURES  Imaging and Procedures for _0 @  OCT, Retina - OU - Both Eyes       Right Eye Quality was  good. Central Foveal Thickness: 390. Progression has improved. Findings include intraretinal fluid, retinal drusen , intraretinal hyper-reflective material, no SRF, abnormal foveal contour, epiretinal membrane (interval improvement in IRF/DME, +ERM).   Left Eye Quality was good. Central Foveal Thickness: 277. Progression has improved. Findings include intraretinal fluid, epiretinal membrane, no SRF, intraretinal hyper-reflective material, normal foveal contour (Mild interval improvement in IRF ST macula).   Notes *Images captured and stored on drive  Diagnosis / Impression:  DME OU OD: interval improvement in IRF/DME, +ERM OS: Mild interval improvement in IRF ST macula  Clinical management:  See below  Abbreviations: NFP - Normal foveal profile. CME - cystoid macular edema. PED - pigment epithelial detachment. IRF - intraretinal fluid. SRF - subretinal fluid. EZ - ellipsoid zone. ERM - epiretinal membrane. ORA - outer retinal atrophy. ORT - outer retinal tubulation. SRHM - subretinal hyper-reflective material        Intravitreal Injection, Pharmacologic Agent - OD - Right Eye       Time Out 08/06/2019. 1:59 PM. Confirmed correct patient, procedure, site, and patient consented.   Anesthesia Topical anesthesia was used. Anesthetic medications included Lidocaine 2%, Proparacaine 0.5%.   Procedure Preparation included 5% betadine to ocular surface, eyelid speculum. A (33g) needle was used.   Injection:  2 mg aflibercept (EAlfonse FlavorsSOLN   NDC: 6176283-151-76Lot: [id number] date: 01/29/2020   Route: Intravitreal, Site: Right Eye, Waste: 0.05 mL  Post-op Post injection exam found visual acuity of at least counting fingers. The patient tolerated the procedure well. There were no complications. The patient received written and verbal post procedure care education.   Notes **SAMPLE MEDICATION ADMINISTERED**                ASSESSMENT/PLAN:    ICD-10-CM   1. Severe  nonproliferative  diabetic retinopathy of both eyes with macular edema associated with type 2 diabetes mellitus (HCC)  O13.0865 Intravitreal Injection, Pharmacologic Agent - OD - Right Eye    aflibercept (EYLEA) SOLN 2 mg    CANCELED: Intravitreal Injection, Pharmacologic Agent - OS - Left Eye  2. Retinal edema  H35.81 OCT, Retina - OU - Both Eyes  3. Essential hypertension  I10   4. Hypertensive retinopathy of both eyes  H35.033   5. Combined forms of age-related cataract of both eyes  H25.813     1, 2. Severe Non-proliferative diabetic retinopathy, both eyes  - delayed follow up from Nov 2020 to April 2021 - 5 months instead of 4 weeks due to having a kidney transplant in December 2020  - initial exam showed massive central DME OU and scattered IRH and IRMA  - FA 6.5.19 with patches of capillary nonperfusion; extensive Mas with late leakage OU; no frank NV  - FA 01.02.20, shows improvement in late leaking microaneurysms OU  - S/P IVA OS #1 (06.05.19), #2 (07.08.19), #3 (07.08.19), #4 (09.03.19)  - S/P IVA OD #1 (06.07.19), #2 (07.08.19), #3 (07.08.19)  - S/P IVE OD #1 (09.03.19), #2 (10.03.19), #3 (11.04.19), #4 (12.02.19), #5 (01.02.20), #6 (02.07.20), #7 (03.16.20), #8 (05.20.20), #9 (06.22.20), #10 (07.27.20), #11(09.09.10), # 12 (10.28.20), #13 (11.25.20), #14 (04.06.21) - sample  - S/P IVE OS #1 (10.03.19), #2 (11.04.19), #3 (12.02.19), #4 (01.02.20), #5 (02.07.20), #6 (03.16.20), #7 (05.20.20), #8 (06.22.20),#9 (07.27.20), #10 (09.09.20), #11 (10.28.20), #12 (11.25.20)  - OCT shows OD: interval improvement in IRF/DME, +ERM; OS: mild interval improvement in IRF ST macula  - BCVA stable OU: OD 20/40,  OS 20/25  - recommend IVE OD #15 today, 05.04.21 -- sample, as Eylea benefits have not been verified for 2021  - will hold off on injection OS again today  - pt in agreement  - RBA of procedure discussed, questions answered  - informed consent obtained and signed  - see procedure note --  tolerated well  - Eylea paperwork and benefits investigation started on 08.05.19 -- approval for 2021 pending  - f/u in 4 weeks -- DFE/OCT/possible IVE  3,4. Hypertensive retinopathy OU  - discussed importance of tight BP control  - has had some Bps in hypertensive emergency range (200s / 110s)  - BP improved post kidney transplant  - discussed likely contribution to DME  - monitor             - BP management per nephrology, PCP and cardiology  5. Combined form age-related cataract OU-   - The symptoms of cataract, surgical options, and treatments and risks were discussed with patient.  - discussed diagnosis and progression  - not yet visually significant  - monitor for now   Ophthalmic Meds Ordered this visit:  Meds ordered this encounter  Medications  . aflibercept (EYLEA) SOLN 2 mg       Return in about 4 weeks (around 09/03/2019) for f/u NPDR OU, DFE, OCT.  There are no Patient Instructions on file for this visit.   Explained the diagnoses, plan, and follow up with the patient and they expressed understanding.  Patient expressed understanding of the importance of proper follow up care.   This document serves as a record of services personally performed by Gardiner Sleeper, MD, PhD. It was created on their behalf by Estill Bakes, COT an ophthalmic technician. The creation of this record is the provider's dictation and/or activities during the visit.    Electronically  signed by: Estill Bakes, COT 08/06/19 @ 4:36 PM   This document serves as a record of services personally performed by Gardiner Sleeper, MD, PhD. It was created on their behalf by Ernest Mallick, OA, an ophthalmic assistant. The creation of this record is the provider's dictation and/or activities during the visit.    Electronically signed by: Ernest Mallick, OA 05.04.2021 4:36 PM  Gardiner Sleeper, M.D., Ph.D. Diseases & Surgery of the Retina and [place] 08/06/2019   I have  reviewed the above documentation for accuracy and completeness, and I agree with the above. Gardiner Sleeper, M.D., Ph.D. 08/06/19 4:36 PM   Abbreviations: M myopia (nearsighted); A astigmatism; H hyperopia (farsighted); P presbyopia; Mrx spectacle prescription;  CTL contact lenses; OD right eye; OS left eye; OU both eyes  XT exotropia; ET esotropia; PEK punctate epithelial keratitis; PEE punctate epithelial erosions; DES dry eye syndrome; MGD meibomian gland dysfunction; ATs artificial tears; PFAT's preservative free artificial tears; [place] nuclear sclerotic cataract; PSC posterior subcapsular cataract; ERM epi-retinal membrane; PVD posterior vitreous detachment; RD retinal detachment; DM diabetes mellitus; DR diabetic retinopathy; NPDR non-proliferative diabetic retinopathy; PDR proliferative diabetic retinopathy; CSME clinically significant macular edema; DME diabetic macular edema; dbh dot blot hemorrhages; CWS cotton wool spot; POAG primary open angle glaucoma; C/D cup-to-disc ratio; HVF humphrey visual field; GVF goldmann visual field; OCT optical coherence tomography; IOP intraocular pressure; BRVO Branch retinal vein occlusion; CRVO central retinal vein occlusion; CRAO central retinal artery occlusion; BRAO branch retinal artery occlusion; RT retinal tear; SB scleral buckle; PPV pars plana vitrectomy; VH Vitreous hemorrhage; PRP panretinal laser photocoagulation; IVK intravitreal kenalog; VMT vitreomacular traction; MH Macular hole;  NVD neovascularization of the disc; NVE neovascularization elsewhere; AREDS age related eye disease study; ARMD age related macular degeneration; POAG primary open angle glaucoma; EBMD epithelial/anterior basement membrane dystrophy; ACIOL anterior chamber intraocular lens; IOL intraocular lens; PCIOL posterior chamber intraocular lens; Phaco/IOL phacoemulsification with intraocular lens placement; [place] photorefractive keratectomy; LASIK laser assisted in situ keratomileusis;  HTN hypertension; DM diabetes mellitus; COPD chronic obstructive pulmonary disease

## 2019-09-04 ENCOUNTER — Encounter (INDEPENDENT_AMBULATORY_CARE_PROVIDER_SITE_OTHER): Payer: BC Managed Care – PPO | Admitting: Ophthalmology

## 2019-09-18 NOTE — Progress Notes (Signed)
[facility] Note  09/20/2019     CHIEF COMPLAINT Patient presents for Retina Follow Up   HISTORY OF PRESENT ILLNESS: Philip Wilson is a 49 y.o. male who presents to the clinic today for:   HPI    Retina Follow Up    Patient presents with  Diabetic Retinopathy.  In both eyes.  This started months ago.  Severity is severe.  Duration of 6 weeks.  Since onset it is stable.  I, the attending physician,  performed the HPI with the patient and updated documentation appropriately.          Comments    49 y/o male pt here for 6 wk f/u for severe NPDR w/mac edema OU.  No change in [place] OU noticed.  Denies pain, FOL, floaters.  No gtts.  BS this a.m. 156.  A1C 5.6 2 wks ago.       Last edited by Bernarda Caffey, MD on 09/20/2019 12:40 PM. 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 [place],  [place] [address]  HISTORICAL INFORMATION:   Selected notes from the MEDICAL RECORD NUMBER Referred by Dr. Ricki Transue for diabetic retinopathy OU    CURRENT MEDICATIONS: No current outpatient medications on file. (Ophthalmic Drugs)   Current Facility-Administered Medications (Ophthalmic Drugs)  Medication Route  . aflibercept (EYLEA) SOLN 2 mg Intravitreal  . aflibercept (EYLEA) SOLN 2 mg Intravitreal  . aflibercept (EYLEA) SOLN 2 mg Intravitreal  . aflibercept (EYLEA) SOLN 2 mg Intravitreal  . aflibercept (EYLEA) SOLN 2 mg Intravitreal  . aflibercept (EYLEA) SOLN 2 mg Intravitreal  . aflibercept (EYLEA) SOLN 2 mg Intravitreal  . aflibercept (EYLEA) SOLN 2 mg Intravitreal  . aflibercept (EYLEA) SOLN 2 mg Intravitreal  . aflibercept (EYLEA) SOLN 2 mg Intravitreal  . aflibercept (EYLEA) SOLN 2 mg Intravitreal   Current Outpatient Medications (Other)  Medication Sig  . ACCU-CHEK GUIDE test strip every morning. Use to test fasting blood glucose   . AgaMatrix Ultra-Thin Lancets MISC 200 each by Misc.(Non-Drug; Combo Route) route 4 times daily. One touch delica  . aspirin 81 MG EC tablet Take by mouth.  Marland Kitchen atorvastatin (LIPITOR) 40 MG tablet TAKE 1 TABLET BY MOUTH DAILY  . B-D UF III MINI PEN NEEDLES 31G X 5 MM MISC   . Blood Glucose Monitoring Suppl (FIFTY50 GLUCOSE METER 2.0) w/Device KIT 1 each by Other route 4 times daily. Use as instructed one touch ultra  . cloNIDine (CATAPRES) 0.1 MG tablet Take 0.1 mg by mouth 2 (two) times daily.   . dapsone 25 MG tablet Take 50 mg by mouth daily.  . fluconazole (DIFLUCAN) 50 MG tablet   . furosemide (LASIX) 40 MG tablet Take 40 mg by mouth daily.  Marland Kitchen glimepiride (AMARYL) 4 MG tablet Take 4 mg by mouth daily with breakfast.   . HYDROcodone-acetaminophen (NORCO/VICODIN) 5-325 MG tablet Take 1 tablet by mouth every 4 (four) hours as needed for moderate pain.  . Insulin Glargine (BASAGLAR KWIKPEN) 100 UNIT/ML Inject into the skin.  Marland Kitchen insulin lispro (HUMALOG) 100 UNIT/ML injection Inject 5 units with breakfast and dinner and 4 units with lunch plus sliding scale as follows: BG 100-150 (1 unit); 151-200 (3 unit); 201-250 (4 unit); 251-300 (5 unit); 251-300 (6 unit); 351-400 (8 unit)  . K Phos Mono-Sod Phos Di & Mono (PHOSPHA  250 NEUTRAL) 155-852-130 MG TABS   . labetalol (NORMODYNE) 200 MG tablet Take 200 mg by mouth 3 (three) times daily.   Marland Kitchen lisinopril (PRINIVIL,ZESTRIL) 20 MG tablet Take 20 mg by mouth 2 (two) times a day.   Marland Kitchen LOKELMA 10 g PACK packet Take 1 packet by mouth daily.  . magnesium oxide (MAG-OX) 400 MG tablet Take 1 tablet by mouth 2 (two) times daily.  . metFORMIN (GLUCOPHAGE-XR) 500 MG 24 hr tablet Take 500 mg by mouth 2 (two) times daily.  . Multiple Vitamin (MULTIVITAMIN WITH MINERALS) TABS tablet Take 1 tablet by mouth daily.  . mycophenolate (MYFORTIC) 180 MG EC tablet Take by mouth.  Marland Kitchen NIFEdipine (PROCARDIA-XL/NIFEDICAL-XL) 30 MG 24 hr tablet Take 30 mg by mouth daily.  Marland Kitchen  NOVOLOG FLEXPEN 100 UNIT/ML FlexPen   . oxyCODONE (OXY IR/ROXICODONE) 5 MG immediate release tablet Take 5 mg by mouth every 4 (four) hours.  . pantoprazole (PROTONIX) 40 MG tablet Take by mouth.  . predniSONE (DELTASONE) 5 MG tablet Take by mouth.  . sodium bicarbonate 650 MG tablet Take by mouth.  . sulfamethoxazole-trimethoprim (BACTRIM) 400-80 MG tablet   . Tacrolimus ER 1 MG TB24 Take by mouth.  . valGANciclovir (VALCYTE) 450 MG tablet Take by mouth.  . metFORMIN (GLUCOPHAGE) 1000 MG tablet Take 1,000 mg by mouth daily.  (Patient not taking: Reported on 09/20/2019)  . Multiple Vitamin (MULTIVITAMIN) capsule Take by mouth. (Patient not taking: Reported on 09/20/2019)   Current Facility-Administered Medications (Other)  Medication Route  . Bevacizumab (AVASTIN) SOLN 1.25 mg Intravitreal  . Bevacizumab (AVASTIN) SOLN 1.25 mg Intravitreal  . Bevacizumab (AVASTIN) SOLN 1.25 mg Intravitreal  . Bevacizumab (AVASTIN) SOLN 1.25 mg Intravitreal  . Bevacizumab (AVASTIN) SOLN 1.25 mg Intravitreal  . Bevacizumab (AVASTIN) SOLN 1.25 mg Intravitreal  . Bevacizumab (AVASTIN) SOLN 1.25 mg Intravitreal      REVIEW OF SYSTEMS: ROS    Positive for: Neurological, Genitourinary, Endocrine, Eyes   Negative for: Constitutional, Gastrointestinal, Skin, Musculoskeletal, HENT, Cardiovascular, Respiratory, Psychiatric, Allergic/Imm, Heme/Lymph   Last edited by Matthew Folks, COA on 09/20/2019  8:03 AM. (History)       ALLERGIES No Known Allergies  PAST MEDICAL HISTORY Past Medical History:  Diagnosis Date  . Cataract    OU  . Chronic kidney disease 05/04/2018  . Diabetes 1.5, managed as type 2 ([place])   . Diabetic retinopathy ([place])    NPDR OU  . Hypertension   . Hypertensive retinopathy    OU   Past Surgical History:  Procedure Laterality Date  . AV FISTULA PLACEMENT Left 09/28/2018   Procedure: Creation of Left arm Radiocephalic ARTERIOVENOUS (AV) FISTULA;  Surgeon: Marty Heck,  MD;  Location: [facility];  Service: Vascular;  Laterality: Left;  Marland Kitchen MULTIPLE TOOTH EXTRACTIONS      FAMILY HISTORY Family History  Problem Relation Age of Onset  . Heart failure Mother     SOCIAL HISTORY Social History   Tobacco Use  . Smoking status: Former Research scientist (life sciences)  . Smokeless tobacco: Never Used  . Tobacco comment: quit 20 years ago  Vaping Use  . Vaping Use: Never used  Substance Use Topics  . Alcohol use: Not Currently  . Drug use: Never         OPHTHALMIC EXAM:  Base Eye Exam    Visual Acuity (Snellen - Linear)      Right Left   Dist [place] 20/40 - 20/40 +2   Dist ph  20/40 + 20/20 -2  Tonometry (Tonopen, 8:06 AM)      Right Left   Pressure 18 20       Pupils      Dark Light Shape React APD   Right 3 2 Round Brisk None   Left 3 2 Round Brisk None       Visual Fields (Counting fingers)      Left Right    Full Full       Extraocular Movement      Right Left    Full, Ortho Full, Ortho       Neuro/Psych    Oriented x3: Yes   Mood/Affect: Normal       Dilation    Both eyes: 1.0% Mydriacyl, 2.5% Phenylephrine @ 8:06 AM        Slit Lamp and Fundus Exam    Slit Lamp Exam      Right Left   Lids/Lashes Dermatochalasis - upper lid, mild Meibomian gland dysfunction Dermatochalasis - upper lid, mild Meibomian gland dysfunction   Conjunctiva/Sclera White and quiet White and quiet   Cornea Clear Clear   Anterior Chamber Deep and quiet, no cell or flare Deep and quiet, no cell or flare   Iris Round and dilated to 32m Round and dilated to 765m  Lens 1+ Nuclear sclerosis, 2+ Cortical cataract 1+ Nuclear sclerosis, 2+ Cortical cataract   Vitreous Mild Vitreous syneresis, Posterior vitreous detachment, Weiss ring Mild Vitreous syneresis, old white VH inferiorly       Fundus Exam      Right Left   Disc Pink and Sharp Pink and Sharp   C/D Ratio 0.2 0.3   Macula blunted foveal reflex, +ERM, persistent edema  good foveal reflex, mild focal exudates  superior to fovea - resolved, scattered MA, mild cystic changes superior macula - improving   Vessels Tortuous, AV crossing changes, Vascular attenuation Tortuous, AV crossing changes   Periphery Attached, scattered MAs and DBH - mostly posterior, No RT/RD Attached, scattered MAs and DBH -- mostly posterior -- all improved--very minimal now, Pigmentation          IMAGING AND PROCEDURES  Imaging and Procedures for _0 @  OCT, Retina - OU - Both Eyes       Right Eye Quality was good. Central Foveal Thickness: 372. Progression has been stable. Findings include intraretinal fluid, retinal drusen , intraretinal hyper-reflective material, no SRF, abnormal foveal contour, epiretinal membrane (Persistent cystic changes, +ERM).   Left Eye Quality was good. Central Foveal Thickness: 270. Progression has improved. Findings include intraretinal fluid, epiretinal membrane, no SRF, intraretinal hyper-reflective material, normal foveal contour (Mild interval improvement in IRF ST macula).   Notes *Images captured and stored on drive  Diagnosis / Impression:  DME OU OD: persistent cystic changes, +ERM OS: Mild interval improvement in IRF ST macula  Clinical management:  See below  Abbreviations: NFP - Normal foveal profile. CME - cystoid macular edema. PED - pigment epithelial detachment. IRF - intraretinal fluid. SRF - subretinal fluid. EZ - ellipsoid zone. ERM - epiretinal membrane. ORA - outer retinal atrophy. ORT - outer retinal tubulation. SRHM - subretinal hyper-reflective material        Intravitreal Injection, Pharmacologic Agent - OD - Right Eye       Time Out 09/20/2019. 8:35 AM. Confirmed correct patient, procedure, site, and patient consented.   Anesthesia Topical anesthesia was used. Anesthetic medications included Lidocaine 2%, Proparacaine 0.5%.   Procedure Preparation included 5% betadine to ocular surface, eyelid speculum. A (32g) needle  was used.   Injection:  2  mg aflibercept Alfonse Flavors) SOLN   NDC: [id number], Lot: [id number], Expiration date: 12/30/2019   Route: Intravitreal, Site: Right Eye, Waste: 0.05 mL  Post-op Post injection exam found visual acuity of at least counting fingers. The patient tolerated the procedure well. There were no complications. The patient received written and verbal post procedure care education.                 ASSESSMENT/PLAN:    ICD-10-CM   1. 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 Retinal edema  H35.81 OCT, Retina - OU - Both Eyes  3. Essential hypertension  I10   4. Hypertensive retinopathy of both eyes  H35.033   5. Combined forms of age-related cataract of both eyes  H25.813     1, 2. Severe Non-proliferative diabetic retinopathy, both eyes  - delayed follow up from Nov 2020 to April 2021 - 5 months instead of 4 weeks due to having a kidney transplant in December 2020  - initial exam showed massive central DME OU and scattered IRH and IRMA  - FA 6.5.19 with patches of capillary nonperfusion; extensive Mas with late leakage OU; no frank NV  - FA 01.02.20, shows improvement in late leaking microaneurysms OU  - S/P IVA OS #1 (06.05.19), #2 (07.08.19), #3 (07.08.19), #4 (09.03.19)  - S/P IVA OD #1 (06.07.19), #2 (07.08.19), #3 (07.08.19)  - S/P IVE OD #1 (09.03.19), #2 (10.03.19), #3 (11.04.19), #4 (12.02.19), #5 (01.02.20), #6 (02.07.20), #7 (03.16.20), #8 (05.20.20), #9 (06.22.20), #10 (07.27.20), #11(09.09.10), # 12 (10.28.20), #13 (11.25.20), #14 (04.06.21) - sample, #15 (05.04.21) - sample  - S/P IVE OS #1 (10.03.19), #2 (11.04.19), #3 (12.02.19), #4 (01.02.20), #5 (02.07.20), #6 (03.16.20), #7 (05.20.20), #8 (06.22.20),#9 (07.27.20), #10 (09.09.20), #11 (10.28.20), #12 (11.25.20)  - OCT shows OD shows persistent  cystic changes, +ERM; OS: mild interval improvement in IRF ST macula  - BCVA: OD stable at 20/40,  OS improved to 20/20 from 20/25  - recommend IVE OD #16 today, 06.18.21   - will hold off on injection OS again today -- IRF improving without therapy and BCVA 20/20  - pt in agreement  - RBA of procedure discussed, questions answered  - informed consent obtained and signed  - see procedure note -- tolerated well  - Eylea paperwork and benefits investigation started on 08.05.19 -- approved for 2021   - f/u in 6 weeks -- DFE/OCT/possible IVE  3,4. Hypertensive retinopathy OU  - discussed importance of tight BP control  - has had some Bps in hypertensive emergency range (200s / 110s)  - BP improved post kidney transplant  - discussed likely contribution to DME  - monitor             - BP management per nephrology, PCP and cardiology  5. Combined form age-related cataract OU-   - The symptoms of cataract, surgical options, and treatments and risks were discussed with patient.  - discussed diagnosis and progression  - not yet visually significant  - monitor for now   Ophthalmic Meds Ordered this visit:  Meds ordered this encounter  Medications  . aflibercept (EYLEA) SOLN 2 mg       Return in about 6 weeks (around 11/01/2019) for f/u NPDR OU, DFE, OCT.  There are no Patient  Instructions on file for this visit.   Explained the diagnoses, plan, and follow up with the patient and they expressed understanding.  Patient expressed understanding of the importance of proper follow up care.   This document serves as a record of services personally performed by Gardiner Sleeper, MD, PhD. It was created on their behalf by Ernest Mallick, OA, an ophthalmic assistant. The creation of this record is the provider's dictation and/or activities during the visit.    Electronically signed by: Ernest Mallick, OA 06.16.2021 12:42 PM  Gardiner Sleeper, M.D., Ph.D. Diseases & Surgery of the Retina and  Vitreous [facility]  I have reviewed the above documentation for accuracy and completeness, and I agree with the above. Gardiner Sleeper, M.D., Ph.D. 09/20/19 12:42 PM   Abbreviations: M myopia (nearsighted); A astigmatism; H hyperopia (farsighted); P presbyopia; Mrx spectacle prescription;  CTL contact lenses; OD right eye; OS left eye; OU both eyes  XT exotropia; ET esotropia; PEK punctate epithelial keratitis; PEE punctate epithelial erosions; DES dry eye syndrome; MGD meibomian gland dysfunction; ATs artificial tears; PFAT's preservative free artificial tears; [place] nuclear sclerotic cataract; PSC posterior subcapsular cataract; ERM epi-retinal membrane; PVD posterior vitreous detachment; RD retinal detachment; DM diabetes mellitus; DR diabetic retinopathy; NPDR non-proliferative diabetic retinopathy; PDR proliferative diabetic retinopathy; CSME clinically significant macular edema; DME diabetic macular edema; dbh dot blot hemorrhages; CWS cotton wool spot; POAG primary open angle glaucoma; C/D cup-to-disc ratio; HVF humphrey visual field; GVF goldmann visual field; OCT optical coherence tomography; IOP intraocular pressure; BRVO Branch retinal vein occlusion; CRVO central retinal vein occlusion; CRAO central retinal artery occlusion; BRAO branch retinal artery occlusion; RT retinal tear; SB scleral buckle; PPV pars plana vitrectomy; VH Vitreous hemorrhage; PRP panretinal laser photocoagulation; IVK intravitreal kenalog; VMT vitreomacular traction; MH Macular hole;  NVD neovascularization of the disc; NVE neovascularization elsewhere; AREDS age related eye disease study; ARMD age related macular degeneration; POAG primary open angle glaucoma; EBMD epithelial/anterior basement membrane dystrophy; ACIOL anterior chamber intraocular lens; IOL intraocular lens; PCIOL posterior chamber intraocular lens; Phaco/IOL phacoemulsification with intraocular lens placement; [place] photorefractive  keratectomy; LASIK laser assisted in situ keratomileusis; HTN hypertension; DM diabetes mellitus; COPD chronic obstructive pulmonary disease

## 2019-09-20 ENCOUNTER — Ambulatory Visit (INDEPENDENT_AMBULATORY_CARE_PROVIDER_SITE_OTHER): Payer: BC Managed Care – PPO | Admitting: Ophthalmology

## 2019-09-20 ENCOUNTER — Other Ambulatory Visit: Payer: Self-pay

## 2019-09-20 ENCOUNTER — Encounter (INDEPENDENT_AMBULATORY_CARE_PROVIDER_SITE_OTHER): Payer: Self-pay | Admitting: Ophthalmology

## 2019-09-20 DIAGNOSIS — H3581 Retinal edema: Secondary | ICD-10-CM | POA: Diagnosis not present

## 2019-09-20 DIAGNOSIS — H35033 Hypertensive retinopathy, bilateral: Secondary | ICD-10-CM | POA: Diagnosis not present

## 2019-09-20 DIAGNOSIS — E113413 Type 2 diabetes mellitus with severe nonproliferative diabetic retinopathy with macular edema, bilateral: Secondary | ICD-10-CM

## 2019-09-20 DIAGNOSIS — I10 Essential (primary) hypertension: Secondary | ICD-10-CM

## 2019-09-20 DIAGNOSIS — H25813 Combined forms of age-related cataract, bilateral: Secondary | ICD-10-CM

## 2019-09-20 MED ORDER — AFLIBERCEPT 2MG/0.05ML IZ SOLN FOR KALEIDOSCOPE
2.0000 mg | INTRAVITREAL | Status: AC | PRN
  Administered 2019-09-20: 2 mg via INTRAVITREAL

## 2019-10-12 IMAGING — US US BIOPSY
1 series · 8 of 8 positions shown · non-contrast
Comparison: none

INDICATION: Chronic kidney disease, hypertension, diabetes and worsening renal
function.

[Series 1: us biopsy · 8 of 8 slices shown]
[im 1/8]
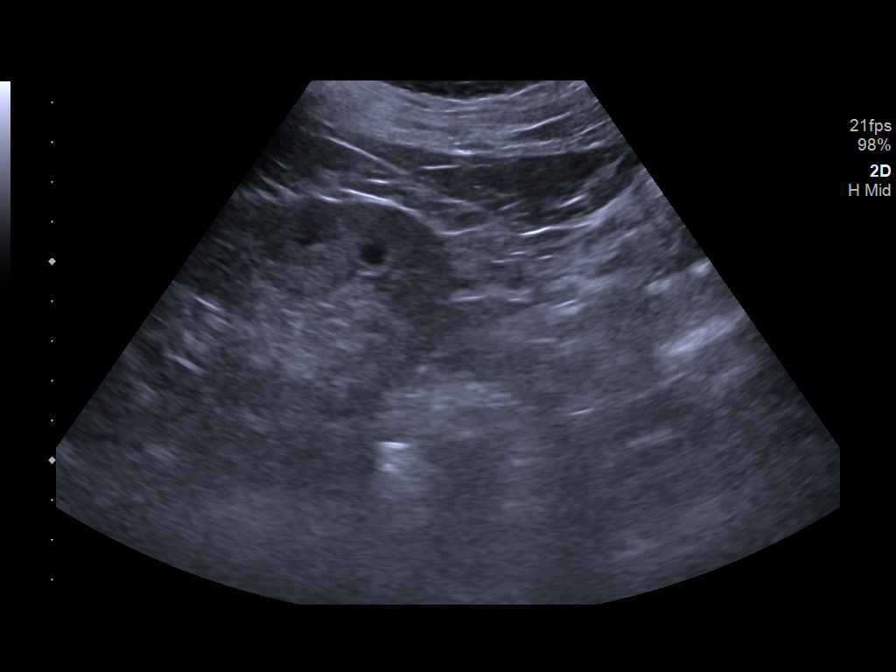
[im 2/8]
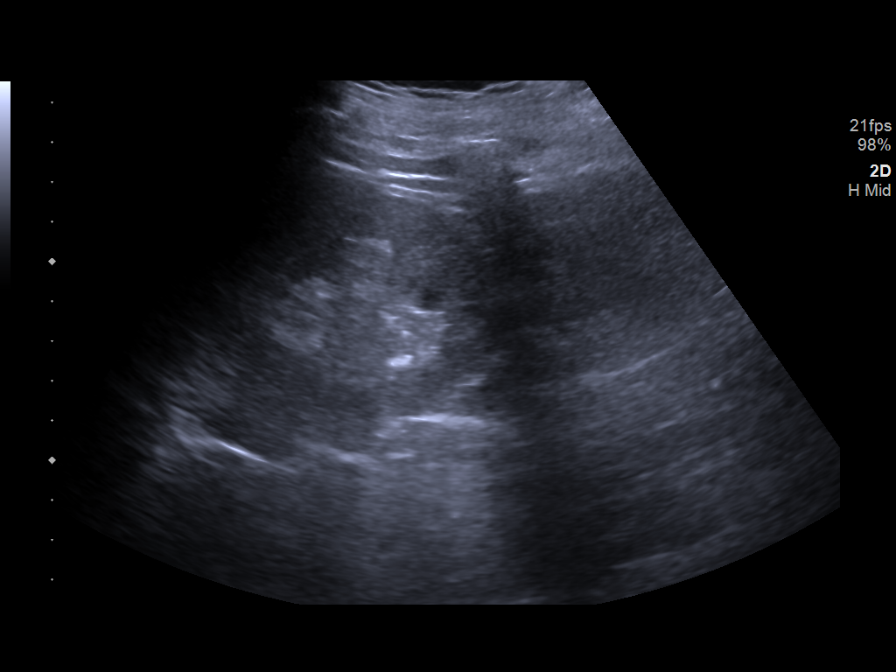
[im 3/8]
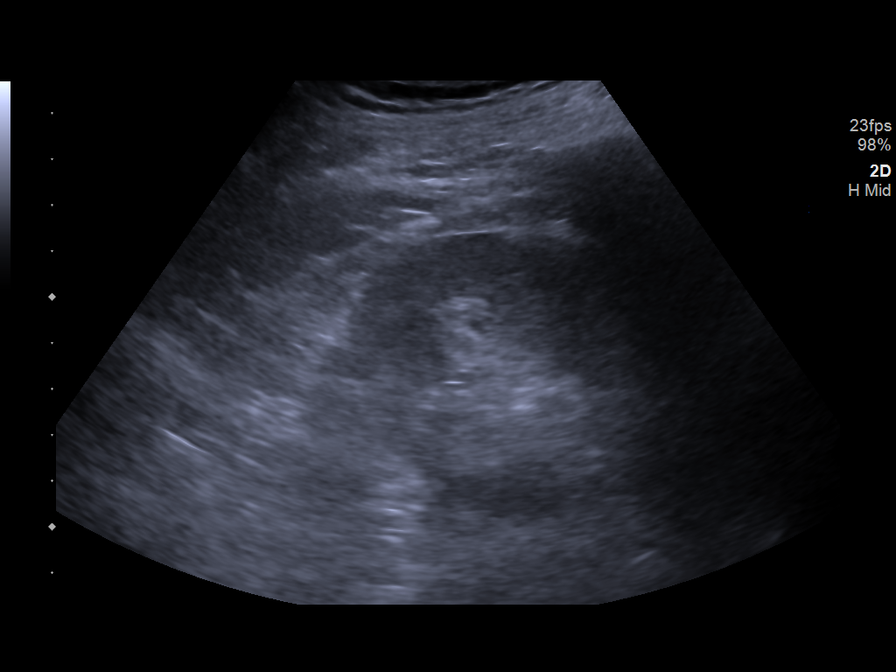
[im 4/8]
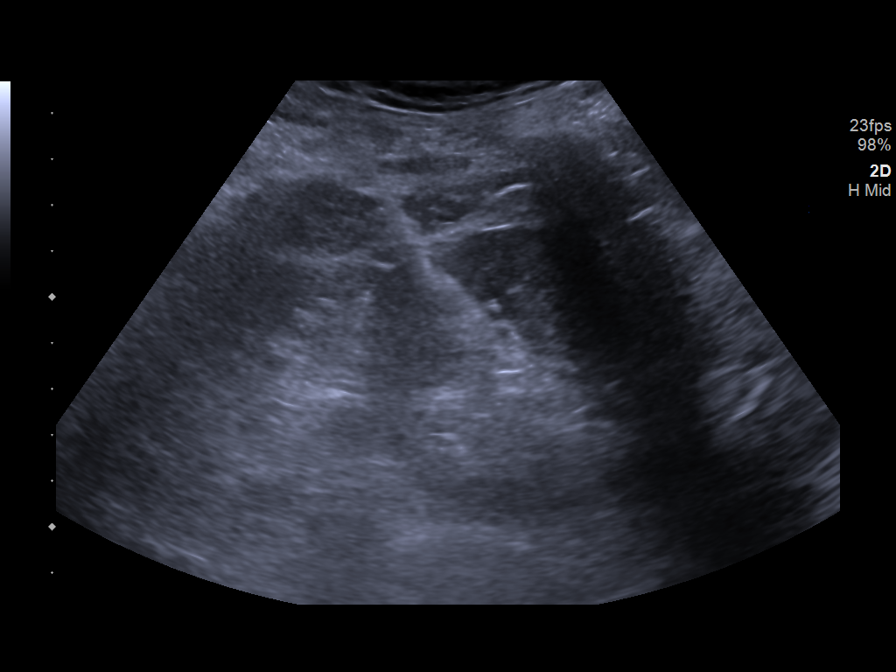
[im 5/8]
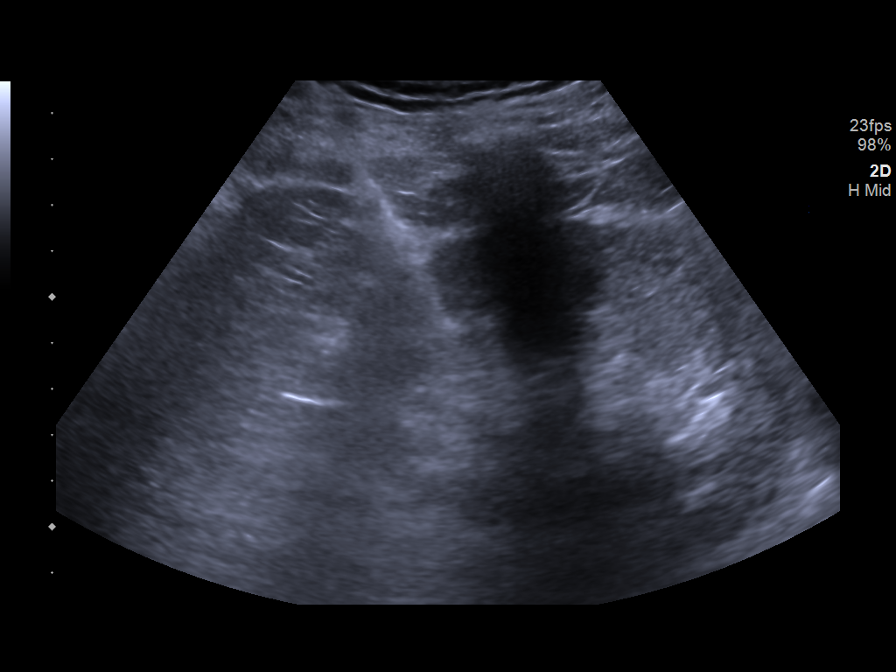
[im 6/8]
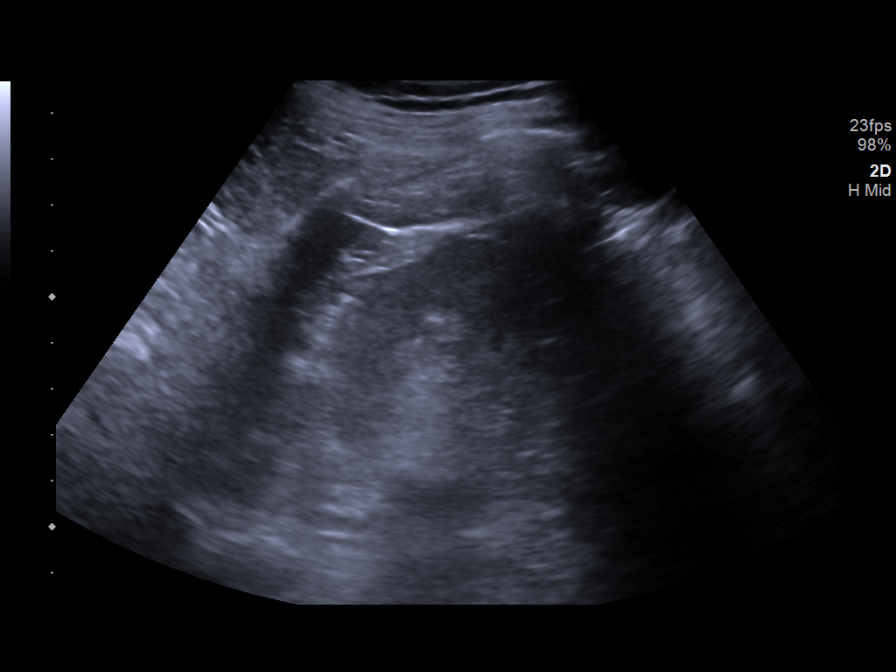
[im 7/8]
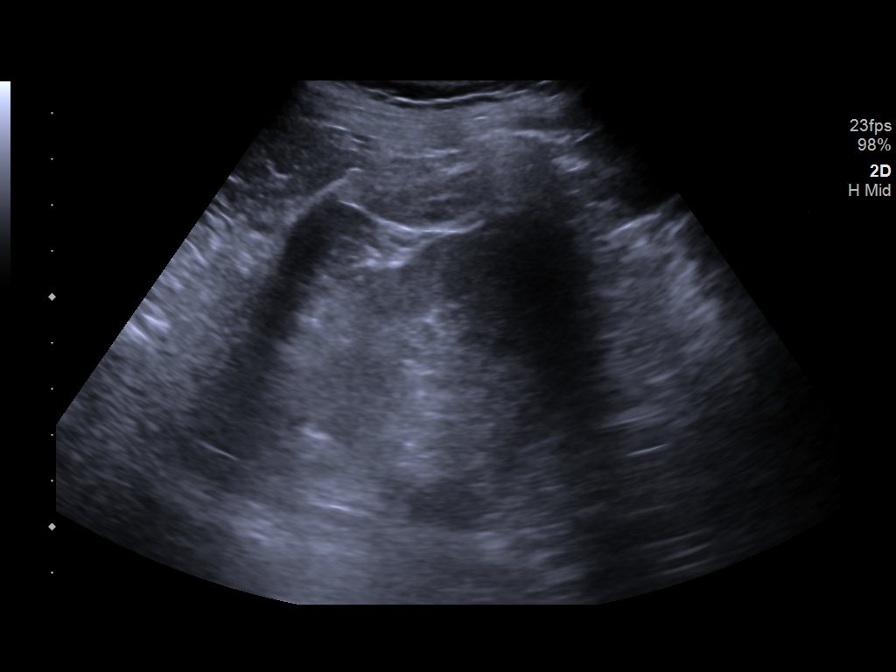
[im 8/8]
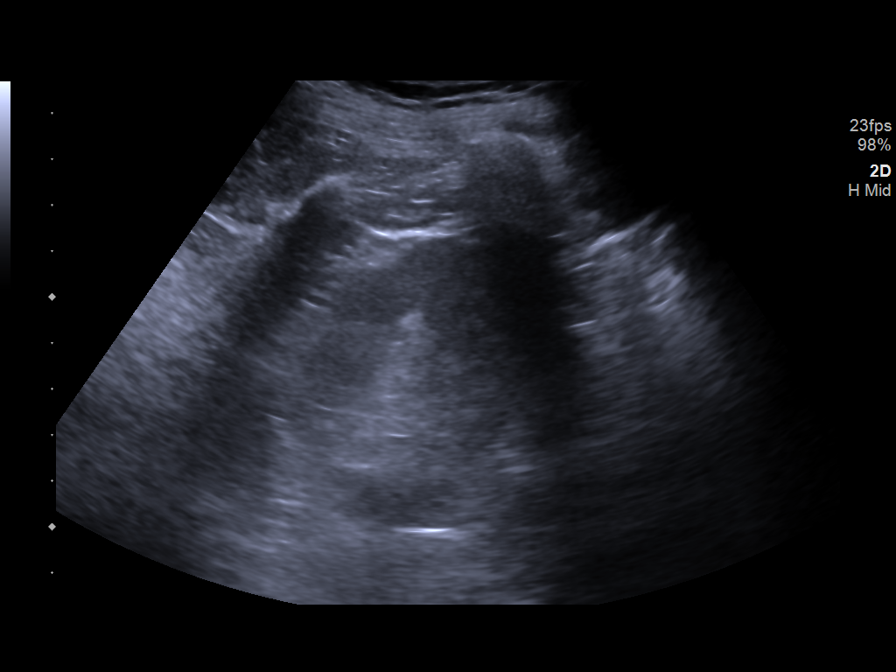

[8 of 8 positions shown; findings below may reference images not displayed]

EXAM:
ULTRASOUND GUIDED CORE BIOPSY OF RIGHT KIDNEY

MEDICATIONS:
None.

ANESTHESIA/SEDATION:
Fentanyl 100 mcg IV; Versed 2.0 mg IV

Moderate Sedation Time:  12 minutes.

The patient was continuously monitored during the procedure by the
interventional radiology nurse under my direct supervision.

PROCEDURE:
The procedure, risks, benefits, and alternatives were explained to
the patient. Questions regarding the procedure were encouraged and
answered. The patient understands and consents to the procedure. A
time-out was performed prior to initiating the procedure.

Ultrasound was initially performed with the patient prone in order
to localize both kidneys. The right flank region was prepped with
chlorhexidine in a sterile fashion, and a sterile drape was applied
covering the operative field. A sterile gown and sterile gloves were
used for the procedure. Local anesthesia was provided with 1%
Lidocaine.

A 16 gauge core needle biopsy device was utilized in obtaining 2
separate core biopsy samples at the level of lower pole cortex of
the right kidney. Samples were submitted in saline. Additional
ultrasound was performed after completion biopsy.

COMPLICATIONS:
None immediate.
FINDINGS: Both kidneys were well visualized by ultrasound. The right lower
pole cortex was closer to the skin and better visualized by
ultrasound compared to the left. Intact and solid core biopsy
samples were obtained. Ultrasound shows no immediate evidence of
bleeding.
IMPRESSION: Ultrasound-guided core biopsy performed of the right kidney at the
level of lower pole cortex.

## 2019-10-23 NOTE — Progress Notes (Addendum)
[facility] Note  11/01/2019     CHIEF COMPLAINT Patient presents for Retina Follow Up   HISTORY OF PRESENT ILLNESS: Philip Wilson is a 49 y.o. male who presents to the clinic today for:   HPI    Retina Follow Up    Patient presents with  Diabetic Retinopathy.  In both eyes.  Severity is moderate.  Duration of 6 weeks.  Since onset it is stable.  I, the attending physician,  performed the HPI with the patient and updated documentation appropriately.          Comments    Patient states vision the same OU. BS was 166 this am. Last a1c was less than 6.        Last edited by Bernarda Caffey, MD on 11/01/2019  8:42 AM. (History)      Referring physician: Lillard Anes, MD [address] [place],  [place] [address]  HISTORICAL INFORMATION:   Selected notes from the MEDICAL RECORD NUMBER Referred by Dr. Ricki Toenjes for diabetic retinopathy OU    CURRENT MEDICATIONS: No current outpatient medications on file. (Ophthalmic Drugs)   Current Facility-Administered Medications (Ophthalmic Drugs)  Medication Route   aflibercept (EYLEA) SOLN 2 mg Intravitreal   aflibercept (EYLEA) SOLN 2 mg Intravitreal   aflibercept (EYLEA) SOLN 2 mg Intravitreal   aflibercept (EYLEA) SOLN 2 mg Intravitreal   aflibercept (EYLEA) SOLN 2 mg Intravitreal   aflibercept (EYLEA) SOLN 2 mg Intravitreal   aflibercept (EYLEA) SOLN 2 mg Intravitreal   aflibercept (EYLEA) SOLN 2 mg Intravitreal   aflibercept (EYLEA) SOLN 2 mg Intravitreal   aflibercept (EYLEA) SOLN 2 mg Intravitreal   aflibercept (EYLEA) SOLN 2 mg Intravitreal   Current Outpatient Medications (Other)  Medication Sig   ACCU-CHEK GUIDE test strip every morning. Use to test fasting blood glucose   AgaMatrix Ultra-Thin Lancets MISC 200 each by Misc.(Non-Drug; Combo Route) route 4 times daily. One touch delica   aspirin 81 MG EC tablet Take by mouth.   atorvastatin (LIPITOR) 40  MG tablet TAKE 1 TABLET BY MOUTH DAILY   B-D UF III MINI PEN NEEDLES 31G X 5 MM MISC    Blood Glucose Monitoring Suppl (FIFTY50 GLUCOSE METER 2.0) w/Device KIT 1 each by Other route 4 times daily. Use as instructed one touch ultra   cloNIDine (CATAPRES) 0.1 MG tablet Take 0.1 mg by mouth 2 (two) times daily.    dapsone 25 MG tablet Take 50 mg by mouth daily.   fluconazole (DIFLUCAN) 50 MG tablet    furosemide (LASIX) 40 MG tablet Take 40 mg by mouth daily.   glimepiride (AMARYL) 4 MG tablet Take 4 mg by mouth daily with breakfast.    HYDROcodone-acetaminophen (NORCO/VICODIN) 5-325 MG tablet Take 1 tablet by mouth every 4 (four) hours as needed for moderate pain.   Insulin Glargine (BASAGLAR KWIKPEN) 100 UNIT/ML Inject into the skin.   insulin lispro (HUMALOG) 100 UNIT/ML injection Inject 5 units with breakfast and dinner and 4 units with lunch plus sliding scale as follows: BG 100-150 (1 unit); 151-200 (3 unit); 201-250 (4 unit); 251-300 (5 unit); 251-300 (6 unit); 351-400 (8 unit)   K Phos Mono-Sod Phos Di & Mono (PHOSPHA 250 NEUTRAL) [phone] MG TABS    labetalol (NORMODYNE) 200 MG tablet Take 200 mg by mouth 3 (three) times daily.    lisinopril (PRINIVIL,ZESTRIL) 20 MG tablet Take 20 mg by mouth 2 (two) times a day.  LOKELMA 10 g PACK packet Take 1 packet by mouth daily.   magnesium oxide (MAG-OX) 400 MG tablet Take 1 tablet by mouth 2 (two) times daily.   metFORMIN (GLUCOPHAGE-XR) 500 MG 24 hr tablet Take 500 mg by mouth 2 (two) times daily.   Multiple Vitamin (MULTIVITAMIN WITH MINERALS) TABS tablet Take 1 tablet by mouth daily.   mycophenolate (MYFORTIC) 180 MG EC tablet Take by mouth.   NIFEdipine (PROCARDIA-XL/NIFEDICAL-XL) 30 MG 24 hr tablet Take 30 mg by mouth daily.   NOVOLOG FLEXPEN 100 UNIT/ML FlexPen    oxyCODONE (OXY IR/ROXICODONE) 5 MG immediate release tablet Take 5 mg by mouth every 4 (four) hours.   pantoprazole (PROTONIX) 40 MG tablet Take by  mouth.   predniSONE (DELTASONE) 5 MG tablet Take by mouth.   sodium bicarbonate 650 MG tablet Take by mouth.   sulfamethoxazole-trimethoprim (BACTRIM) 400-80 MG tablet    Tacrolimus ER 1 MG TB24 Take by mouth.   valGANciclovir (VALCYTE) 450 MG tablet Take by mouth.   metFORMIN (GLUCOPHAGE) 1000 MG tablet Take 1,000 mg by mouth daily.  (Patient not taking: Reported on 09/20/2019)   Multiple Vitamin (MULTIVITAMIN) capsule Take by mouth. (Patient not taking: Reported on 09/20/2019)   Current Facility-Administered Medications (Other)  Medication Route   Bevacizumab (AVASTIN) SOLN 1.25 mg Intravitreal   Bevacizumab (AVASTIN) SOLN 1.25 mg Intravitreal   Bevacizumab (AVASTIN) SOLN 1.25 mg Intravitreal   Bevacizumab (AVASTIN) SOLN 1.25 mg Intravitreal   Bevacizumab (AVASTIN) SOLN 1.25 mg Intravitreal   Bevacizumab (AVASTIN) SOLN 1.25 mg Intravitreal   Bevacizumab (AVASTIN) SOLN 1.25 mg Intravitreal      REVIEW OF SYSTEMS: ROS    Positive for: Neurological, Genitourinary, Endocrine, Eyes   Negative for: Constitutional, Gastrointestinal, Skin, Musculoskeletal, HENT, Cardiovascular, Respiratory, Psychiatric, Allergic/Imm, Heme/Lymph   Last edited by Roselee Nova D, COT on 11/01/2019  8:13 AM. (History)       ALLERGIES No Known Allergies  PAST MEDICAL HISTORY Past Medical History:  Diagnosis Date   Cataract    OU   Chronic kidney disease 05/04/2018   Diabetes 1.5, managed as type 2 (HCC)    Diabetic retinopathy ([place])    NPDR OU   Hypertension    Hypertensive retinopathy    OU   Past Surgical History:  Procedure Laterality Date   AV FISTULA PLACEMENT Left 09/28/2018   Procedure: Creation of Left arm Radiocephalic ARTERIOVENOUS (AV) FISTULA;  Surgeon: Marty Heck, MD;  Location: [facility] OR;  Service: Vascular;  Laterality: Left;   MULTIPLE TOOTH EXTRACTIONS      FAMILY HISTORY Family History  Problem Relation Age of Onset   Heart failure Mother      SOCIAL HISTORY Social History   Tobacco Use   Smoking status: Former Smoker   Smokeless tobacco: Never Used   Tobacco comment: quit 20 years ago  Vaping Use   Vaping Use: Never used  Substance Use Topics   Alcohol use: Not Currently   Drug use: Never         OPHTHALMIC EXAM:  Base Eye Exam    Visual Acuity (Snellen - Linear)      Right Left   Dist [place] 20/40 +2 20/30 -2   Dist ph [place] 20/30 -1 20/25 +1       Tonometry (Tonopen, 8:22 AM)      Right Left   Pressure 18 19       Pupils      Dark Light Shape React APD   Right 3 2  Round Brisk None   Left 3 2 Round Brisk None       Visual Fields (Counting fingers)      Left Right    Full Full       Extraocular Movement      Right Left    Full, Ortho Full, Ortho       Neuro/Psych    Oriented x3: Yes   Mood/Affect: Normal       Dilation    Both eyes: 1.0% Mydriacyl, 2.5% Phenylephrine @ 8:22 AM        Slit Lamp and Fundus Exam    Slit Lamp Exam      Right Left   Lids/Lashes Dermatochalasis - upper lid, mild Meibomian gland dysfunction Dermatochalasis - upper lid, mild Meibomian gland dysfunction   Conjunctiva/Sclera White and quiet White and quiet   Cornea Clear Clear   Anterior Chamber Deep and quiet, no cell or flare Deep and quiet, no cell or flare   Iris Round and dilated to 78m Round and dilated to 753m  Lens 1+ Nuclear sclerosis, 2+ Cortical cataract 1+ Nuclear sclerosis, 2+ Cortical cataract   Vitreous Mild Vitreous syneresis, Posterior vitreous detachment, Weiss ring Mild Vitreous syneresis, old white VH inferiorly       Fundus Exam      Right Left   Disc Pink and Sharp Pink and Sharp   C/D Ratio 0.2 0.3   Macula blunted foveal reflex, +ERM, interval improvement in edema good foveal reflex,scattered MA, mild cystic changes superior macula - persistent   Vessels Tortuous, AV crossing changes, Vascular attenuation Tortuous, AV crossing changes   Periphery Attached, scattered MAs and DBH  - mostly posterior, No RT/RD Attached, scattered MAs and DBH -- mostly posterior -- all improved--very minimal now, Pigmentation          IMAGING AND PROCEDURES  Imaging and Procedures for _0 @  OCT, Retina - OU - Both Eyes       Right Eye Quality was good. Central Foveal Thickness: 355. Progression has improved. Findings include intraretinal fluid, retinal drusen , intraretinal hyper-reflective material, no SRF, abnormal foveal contour, epiretinal membrane (Interval improvement in IRF.  +ERM).   Left Eye Quality was good. Central Foveal Thickness: 265. Progression has been stable. Findings include intraretinal fluid, epiretinal membrane, no SRF, intraretinal hyper-reflective material, normal foveal contour (Persistent IRF slightly improved ST macula).   Notes *Images captured and stored on drive  Diagnosis / Impression:  DME OU OD: Interval improvement in IRF.  +ERM OS: Persistent IRF slightly improved ST macula  Clinical management:  See below  Abbreviations: NFP - Normal foveal profile. CME - cystoid macular edema. PED - pigment epithelial detachment. IRF - intraretinal fluid. SRF - subretinal fluid. EZ - ellipsoid zone. ERM - epiretinal membrane. ORA - outer retinal atrophy. ORT - outer retinal tubulation. SRHM - subretinal hyper-reflective material        Intravitreal Injection, Pharmacologic Agent - OD - Right Eye       Time Out 11/01/2019. 8:42 AM. Confirmed correct patient, procedure, site, and patient consented.   Anesthesia Topical anesthesia was used. Anesthetic medications included Lidocaine 2%, Proparacaine 0.5%.   Procedure Preparation included 5% betadine to ocular surface, eyelid speculum. A (32g) needle was used.   Injection:  2 mg aflibercept (EAlfonse FlavorsSOLN   NDC: [id number]: [id number] date: 01/03/2020   Route: Intravitreal, Site: Right Eye, Waste: 0.05 mL  Post-op Post injection exam found visual acuity of at least counting  fingers.  The patient tolerated the procedure well. There were no complications. The patient received written and verbal post procedure care education.                 ASSESSMENT/PLAN:    ICD-10-CM   1. 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 Retinal edema  H35.81 OCT, Retina - OU - Both Eyes  3. Essential hypertension  I10   4. Hypertensive retinopathy of both eyes  H35.033   5. Combined forms of age-related cataract of both eyes  H25.813     1, 2. Severe Non-proliferative diabetic retinopathy, both eyes  - delayed follow up from Nov 2020 to April 2021 - 5 months instead of 4 weeks due to having a kidney transplant in December 2020  - initial exam showed massive central DME OU and scattered IRH and IRMA  - FA 6.5.19 with patches of capillary nonperfusion; extensive Mas with late leakage OU; no frank NV  - FA 01.02.20, shows improvement in late leaking microaneurysms OU  - S/P IVA OS #1 (06.05.19), #2 (07.08.19), #3 (07.08.19), #4 (09.03.19)  - S/P IVA OD #1 (06.07.19), #2 (07.08.19), #3 (07.08.19)  - S/P IVE OD #1 (09.03.19), #2 (10.03.19), #3 (11.04.19), #4 (12.02.19), #5 (01.02.20), #6 (02.07.20), #7 (03.16.20), #8 (05.20.20), #9 (06.22.20), #10 (07.27.20), #11(09.09.10), # 12 (10.28.20), #13 (11.25.20), #14 (04.06.21) - sample, #15 (05.04.21) - sample, #16 (06.18.21)  - S/P IVE OS #1 (10.03.19), #2 (11.04.19), #3 (12.02.19), #4 (01.02.20), #5 (02.07.20), #6 (03.16.20), #7 (05.20.20), #8 (06.22.20),#9 (07.27.20), #10 (09.09.20), #11 (10.28.20), #12 (11.25.20)  - OCT shows OD shows interval improvement in IRF, +ERM; OS: mild interval improvement in IRF ST macula  - BCVA: OD 20/30,  OS 20/25  - recommend IVE OD #17 today, 07.30.21   - will hold off on injection OS again today -- IRF improving  without therapy and BCVA 20/25  - pt in agreement  - RBA of procedure discussed, questions answered  - informed consent obtained and signed  - see procedure note -- tolerated well  - Eylea paperwork and benefits investigation started on 08.05.19 -- approved for 2021   - f/u in 6 weeks -- DFE/OCT/possible IVE  3,4. Hypertensive retinopathy OU  - discussed importance of tight BP control  - has had some Bps in hypertensive emergency range (200s / 110s)  - BP improved post kidney transplant  - discussed likely contribution to DME  - monitor             - BP management per nephrology, PCP and cardiology  5. Combined form age-related cataract OU-   - The symptoms of cataract, surgical options, and treatments and risks were discussed with patient.  - discussed diagnosis and progression  - not yet visually significant  - monitor for now   Ophthalmic Meds Ordered this visit:  Meds ordered this encounter  Medications   aflibercept (EYLEA) SOLN 2 mg       Return in about 6 weeks (around 12/13/2019) for 6 wk f/u for NPDR OU w/DFE/OCT/likely inj,.  There are no Patient Instructions on file for this visit.   Explained the diagnoses, plan, and follow up with the patient and they expressed understanding.  Patient expressed understanding of the importance of proper follow up care.   This document serves as a record of services  personally performed by Gardiner Sleeper, MD, PhD. It was created on their behalf by San Jetty. Owens Shark, OA an ophthalmic technician. The creation of this record is the provider's dictation and/or activities during the visit.    Electronically signed by: San Jetty. Owens Shark, [place] 07.21.2021 2:26 AM  Gardiner Sleeper, M.D., Ph.D. Diseases & Surgery of the Retina and Vitreous [facility]  I have reviewed the above documentation for accuracy and completeness, and I agree with the above. Gardiner Sleeper, M.D., Ph.D. 11/03/19 2:30 AM   Abbreviations: M  myopia (nearsighted); A astigmatism; H hyperopia (farsighted); P presbyopia; Mrx spectacle prescription;  CTL contact lenses; OD right eye; OS left eye; OU both eyes  XT exotropia; ET esotropia; PEK punctate epithelial keratitis; PEE punctate epithelial erosions; DES dry eye syndrome; MGD meibomian gland dysfunction; ATs artificial tears; PFAT's preservative free artificial tears; [place] nuclear sclerotic cataract; PSC posterior subcapsular cataract; ERM epi-retinal membrane; PVD posterior vitreous detachment; RD retinal detachment; DM diabetes mellitus; DR diabetic retinopathy; NPDR non-proliferative diabetic retinopathy; PDR proliferative diabetic retinopathy; CSME clinically significant macular edema; DME diabetic macular edema; dbh dot blot hemorrhages; CWS cotton wool spot; POAG primary open angle glaucoma; C/D cup-to-disc ratio; HVF humphrey visual field; GVF goldmann visual field; OCT optical coherence tomography; IOP intraocular pressure; BRVO Branch retinal vein occlusion; CRVO central retinal vein occlusion; CRAO central retinal artery occlusion; BRAO branch retinal artery occlusion; RT retinal tear; SB scleral buckle; PPV pars plana vitrectomy; VH Vitreous hemorrhage; PRP panretinal laser photocoagulation; IVK intravitreal kenalog; VMT vitreomacular traction; MH Macular hole;  NVD neovascularization of the disc; NVE neovascularization elsewhere; AREDS age related eye disease study; ARMD age related macular degeneration; POAG primary open angle glaucoma; EBMD epithelial/anterior basement membrane dystrophy; ACIOL anterior chamber intraocular lens; IOL intraocular lens; PCIOL posterior chamber intraocular lens; Phaco/IOL phacoemulsification with intraocular lens placement; [place] photorefractive keratectomy; LASIK laser assisted in situ keratomileusis; HTN hypertension; DM diabetes mellitus; COPD chronic obstructive pulmonary disease

## 2019-11-01 ENCOUNTER — Ambulatory Visit (INDEPENDENT_AMBULATORY_CARE_PROVIDER_SITE_OTHER): Payer: BC Managed Care – PPO | Admitting: Ophthalmology

## 2019-11-01 ENCOUNTER — Encounter (INDEPENDENT_AMBULATORY_CARE_PROVIDER_SITE_OTHER): Payer: Self-pay | Admitting: Ophthalmology

## 2019-11-01 ENCOUNTER — Other Ambulatory Visit: Payer: Self-pay

## 2019-11-01 DIAGNOSIS — E113413 Type 2 diabetes mellitus with severe nonproliferative diabetic retinopathy with macular edema, bilateral: Secondary | ICD-10-CM

## 2019-11-01 DIAGNOSIS — H35033 Hypertensive retinopathy, bilateral: Secondary | ICD-10-CM | POA: Diagnosis not present

## 2019-11-01 DIAGNOSIS — I10 Essential (primary) hypertension: Secondary | ICD-10-CM | POA: Diagnosis not present

## 2019-11-01 DIAGNOSIS — H3581 Retinal edema: Secondary | ICD-10-CM

## 2019-11-01 DIAGNOSIS — H25813 Combined forms of age-related cataract, bilateral: Secondary | ICD-10-CM

## 2019-11-03 MED ORDER — AFLIBERCEPT 2MG/0.05ML IZ SOLN FOR KALEIDOSCOPE
2.0000 mg | INTRAVITREAL | Status: AC | PRN
  Administered 2019-11-03: 2 mg via INTRAVITREAL

## 2019-12-13 ENCOUNTER — Encounter (INDEPENDENT_AMBULATORY_CARE_PROVIDER_SITE_OTHER): Payer: Self-pay | Admitting: Ophthalmology

## 2019-12-13 ENCOUNTER — Encounter (INDEPENDENT_AMBULATORY_CARE_PROVIDER_SITE_OTHER): Payer: Medicare Other | Admitting: Ophthalmology

## 2019-12-13 ENCOUNTER — Other Ambulatory Visit: Payer: Self-pay

## 2019-12-13 ENCOUNTER — Ambulatory Visit (INDEPENDENT_AMBULATORY_CARE_PROVIDER_SITE_OTHER): Payer: Medicare Other | Admitting: Ophthalmology

## 2019-12-13 DIAGNOSIS — H3581 Retinal edema: Secondary | ICD-10-CM

## 2019-12-13 DIAGNOSIS — H25813 Combined forms of age-related cataract, bilateral: Secondary | ICD-10-CM

## 2019-12-13 DIAGNOSIS — E113413 Type 2 diabetes mellitus with severe nonproliferative diabetic retinopathy with macular edema, bilateral: Secondary | ICD-10-CM

## 2019-12-13 DIAGNOSIS — I10 Essential (primary) hypertension: Secondary | ICD-10-CM | POA: Diagnosis not present

## 2019-12-13 DIAGNOSIS — H35033 Hypertensive retinopathy, bilateral: Secondary | ICD-10-CM | POA: Diagnosis not present

## 2019-12-13 MED ORDER — AFLIBERCEPT 2MG/0.05ML IZ SOLN FOR KALEIDOSCOPE
2.0000 mg | INTRAVITREAL | Status: AC | PRN
Start: 2019-12-13 — End: 2019-12-13
  Administered 2019-12-13: 2 mg via INTRAVITREAL

## 2019-12-13 NOTE — Progress Notes (Signed)
[facility] Note  12/13/2019     CHIEF COMPLAINT Patient presents for Retina Follow Up   HISTORY OF PRESENT ILLNESS: Philip Wilson is a 49 y.o. male who presents to the clinic today for:   HPI    Retina Follow Up    Patient presents with  Diabetic Retinopathy.  In both eyes.  Severity is moderate.  Duration of 6 weeks.  Since onset it is stable.  I, the attending physician,  performed the HPI with the patient and updated documentation appropriately.          Comments    Patient states vision the same OU. BS 240 by continuous meter, read in office. Last a1c was 7.0, checked around a month or two ago.       Last edited by Bernarda Caffey, MD on 12/13/2019  9:39 PM. (History)    pt states no new health or vision concerns  Referring physician: Phylliss Blakes, OD [place],  [place] [id number]  HISTORICAL INFORMATION:   Selected notes from the MEDICAL RECORD NUMBER Referred by Dr. Ricki Lindenbaum for diabetic retinopathy OU    CURRENT MEDICATIONS: No current outpatient medications on file. (Ophthalmic Drugs)   Current Facility-Administered Medications (Ophthalmic Drugs)  Medication Route  . aflibercept (EYLEA) SOLN 2 mg Intravitreal  . aflibercept (EYLEA) SOLN 2 mg Intravitreal  . aflibercept (EYLEA) SOLN 2 mg Intravitreal  . aflibercept (EYLEA) SOLN 2 mg Intravitreal  . aflibercept (EYLEA) SOLN 2 mg Intravitreal  . aflibercept (EYLEA) SOLN 2 mg Intravitreal  . aflibercept (EYLEA) SOLN 2 mg Intravitreal  . aflibercept (EYLEA) SOLN 2 mg Intravitreal  . aflibercept (EYLEA) SOLN 2 mg Intravitreal  . aflibercept (EYLEA) SOLN 2 mg Intravitreal  . aflibercept (EYLEA) SOLN 2 mg Intravitreal   Current Outpatient Medications (Other)  Medication Sig  . ACCU-CHEK GUIDE test strip every morning. Use to test fasting blood glucose  . AgaMatrix Ultra-Thin Lancets MISC 200 each by Misc.(Non-Drug; Combo Route) route 4 times daily. One touch delica   . aspirin 81 MG EC tablet Take by mouth.  Marland Kitchen atorvastatin (LIPITOR) 40 MG tablet TAKE 1 TABLET BY MOUTH DAILY  . B-D UF III MINI PEN NEEDLES 31G X 5 MM MISC   . Blood Glucose Monitoring Suppl (FIFTY50 GLUCOSE METER 2.0) w/Device KIT 1 each by Other route 4 times daily. Use as instructed one touch ultra  . cloNIDine (CATAPRES) 0.1 MG tablet Take 0.1 mg by mouth 2 (two) times daily.   . dapsone 25 MG tablet Take 50 mg by mouth daily.  . fluconazole (DIFLUCAN) 50 MG tablet   . furosemide (LASIX) 40 MG tablet Take 40 mg by mouth daily.  Marland Kitchen glimepiride (AMARYL) 4 MG tablet Take 4 mg by mouth daily with breakfast.   . HYDROcodone-acetaminophen (NORCO/VICODIN) 5-325 MG tablet Take 1 tablet by mouth every 4 (four) hours as needed for moderate pain.  . Insulin Glargine (BASAGLAR KWIKPEN) 100 UNIT/ML Inject into the skin.  Marland Kitchen insulin lispro (HUMALOG) 100 UNIT/ML injection Inject 5 units with breakfast and dinner and 4 units with lunch plus sliding scale as follows: BG 100-150 (1 unit); 151-200 (3 unit); 201-250 (4 unit); 251-300 (5 unit); 251-300 (6 unit); 351-400 (8 unit)  . K Phos Mono-Sod Phos Di & Mono (PHOSPHA 250 NEUTRAL) 155-852-130 MG TABS   . labetalol (NORMODYNE) 200 MG tablet Take 200 mg by mouth 3 (three) times daily.   Marland Kitchen lisinopril (PRINIVIL,ZESTRIL) 20 MG tablet  Take 20 mg by mouth 2 (two) times a day.   Marland Kitchen LOKELMA 10 g PACK packet Take 1 packet by mouth daily.  . magnesium oxide (MAG-OX) 400 MG tablet Take 1 tablet by mouth 2 (two) times daily.  . metFORMIN (GLUCOPHAGE-XR) 500 MG 24 hr tablet Take 500 mg by mouth 2 (two) times daily.  . Multiple Vitamin (MULTIVITAMIN WITH MINERALS) TABS tablet Take 1 tablet by mouth daily.  . mycophenolate (MYFORTIC) 180 MG EC tablet Take by mouth.  Marland Kitchen NIFEdipine (PROCARDIA-XL/NIFEDICAL-XL) 30 MG 24 hr tablet Take 30 mg by mouth daily.  Marland Kitchen NOVOLOG FLEXPEN 100 UNIT/ML FlexPen   . oxyCODONE (OXY IR/ROXICODONE) 5 MG immediate release tablet Take 5 mg by mouth  every 4 (four) hours.  . pantoprazole (PROTONIX) 40 MG tablet Take by mouth.  . predniSONE (DELTASONE) 5 MG tablet Take by mouth.  . sodium bicarbonate 650 MG tablet Take by mouth.  . sulfamethoxazole-trimethoprim (BACTRIM) 400-80 MG tablet   . Tacrolimus ER 1 MG TB24 Take by mouth.  . valGANciclovir (VALCYTE) 450 MG tablet Take by mouth.  . metFORMIN (GLUCOPHAGE) 1000 MG tablet Take 1,000 mg by mouth daily.  (Patient not taking: Reported on 09/20/2019)  . Multiple Vitamin (MULTIVITAMIN) capsule Take by mouth. (Patient not taking: Reported on 09/20/2019)   Current Facility-Administered Medications (Other)  Medication Route  . Bevacizumab (AVASTIN) SOLN 1.25 mg Intravitreal  . Bevacizumab (AVASTIN) SOLN 1.25 mg Intravitreal  . Bevacizumab (AVASTIN) SOLN 1.25 mg Intravitreal  . Bevacizumab (AVASTIN) SOLN 1.25 mg Intravitreal  . Bevacizumab (AVASTIN) SOLN 1.25 mg Intravitreal  . Bevacizumab (AVASTIN) SOLN 1.25 mg Intravitreal  . Bevacizumab (AVASTIN) SOLN 1.25 mg Intravitreal      REVIEW OF SYSTEMS: ROS    Positive for: Neurological, Genitourinary, Endocrine, Eyes   Negative for: Constitutional, Gastrointestinal, Skin, Musculoskeletal, HENT, Cardiovascular, Respiratory, Psychiatric, Allergic/Imm, Heme/Lymph   Last edited by Roselee Nova D, COT on 12/13/2019  2:32 PM. (History)       ALLERGIES No Known Allergies  PAST MEDICAL HISTORY Past Medical History:  Diagnosis Date  . Cataract    OU  . Chronic kidney disease 05/04/2018  . Diabetes 1.5, managed as type 2 ([place])   . Diabetic retinopathy ([place])    NPDR OU  . Hypertension   . Hypertensive retinopathy    OU   Past Surgical History:  Procedure Laterality Date  . AV FISTULA PLACEMENT Left 09/28/2018   Procedure: Creation of Left arm Radiocephalic ARTERIOVENOUS (AV) FISTULA;  Surgeon: Marty Heck, MD;  Location: [facility];  Service: Vascular;  Laterality: Left;  Marland Kitchen MULTIPLE TOOTH EXTRACTIONS      FAMILY HISTORY Family  History  Problem Relation Age of Onset  . Heart failure Mother     SOCIAL HISTORY Social History   Tobacco Use  . Smoking status: Former Research scientist (life sciences)  . Smokeless tobacco: Never Used  . Tobacco comment: quit 20 years ago  Vaping Use  . Vaping Use: Never used  Substance Use Topics  . Alcohol use: Not Currently  . Drug use: Never         OPHTHALMIC EXAM:  Base Eye Exam    Visual Acuity (Snellen - Linear)      Right Left   Dist [place] 20/50 +2 20/40 +2   Dist ph [place] 20/40 +2 20/25 +2       Tonometry (Tonopen, 2:42 PM)      Right Left   Pressure 22 22       Pupils  Dark Light Shape React APD   Right 3 2 Round Brisk None   Left 3 2 Round Brisk None       Visual Fields (Counting fingers)      Left Right    Full Full       Extraocular Movement      Right Left    Full, Ortho Full, Ortho       Neuro/Psych    Oriented x3: Yes   Mood/Affect: Normal       Dilation    Both eyes: 1.0% Mydriacyl, 2.5% Phenylephrine @ 2:42 PM        Slit Lamp and Fundus Exam    Slit Lamp Exam      Right Left   Lids/Lashes Dermatochalasis - upper lid, mild Meibomian gland dysfunction Dermatochalasis - upper lid, mild Meibomian gland dysfunction   Conjunctiva/Sclera White and quiet White and quiet   Cornea Clear Clear   Anterior Chamber Deep and quiet, no cell or flare Deep and quiet, no cell or flare   Iris Round and dilated to 21m Round and dilated to 761m  Lens 1+ Nuclear sclerosis, 2+ Cortical cataract 1+ Nuclear sclerosis, 2+ Cortical cataract   Vitreous Mild Vitreous syneresis, Posterior vitreous detachment, Weiss ring Mild Vitreous syneresis, old white VH inferiorly       Fundus Exam      Right Left   Disc Pink and Sharp Pink and Sharp   C/D Ratio 0.2 0.3   Macula blunted foveal reflex, +ERM greatest superior to fovea, ?progression, persistent cystic changes Flat, good foveal reflex, scattered MA, trace cystic changes temporal macula - improved   Vessels Tortuous, AV  crossing changes, Vascular attenuation Tortuous, AV crossing changes   Periphery Attached, scattered MAs and DBH - mostly posterior, No RT/RD Attached, scattered MAs and DBH -- mostly posterior -- all improved--very minimal now, Pigmentation        Refraction    Manifest Refraction (Auto)      Sphere Cylinder Axis Dist VA   Right -0.25 +1.00 180 20/40   Left -1.25 +0.50 038 20/30          IMAGING AND PROCEDURES  Imaging and Procedures for '@TODAY' @  OCT, Retina - OU - Both Eyes       Right Eye Quality was good. Central Foveal Thickness: 348. Progression has been stable. Findings include intraretinal fluid, retinal drusen , intraretinal hyper-reflective material, no SRF, abnormal foveal contour, epiretinal membrane (persistent IRF; +ERM).   Left Eye Quality was good. Central Foveal Thickness: 269. Progression has improved. Findings include intraretinal fluid, epiretinal membrane, no SRF, intraretinal hyper-reflective material, normal foveal contour (Persistent IRF slightly improved ST macula).   Notes *Images captured and stored on drive  Diagnosis / Impression:  DME OU OD: persistent IRF, +ERM OS: Persistent IRF slightly improved ST macula  Clinical management:  See below  Abbreviations: NFP - Normal foveal profile. CME - cystoid macular edema. PED - pigment epithelial detachment. IRF - intraretinal fluid. SRF - subretinal fluid. EZ - ellipsoid zone. ERM - epiretinal membrane. ORA - outer retinal atrophy. ORT - outer retinal tubulation. SRHM - subretinal hyper-reflective material        Intravitreal Injection, Pharmacologic Agent - OD - Right Eye       Time Out 12/13/2019. 4:00 PM. Confirmed correct patient, procedure, site, and patient consented.   Anesthesia Topical anesthesia was used. Anesthetic medications included Lidocaine 2%, Proparacaine 0.5%.   Procedure Preparation included 5% betadine to ocular surface, eyelid  speculum. A (32g) needle was used.    Injection:  2 mg aflibercept Alfonse Flavors) SOLN   NDC: A3590391, Lot: [id number], Expiration date: 03/04/2020   Route: Intravitreal, Site: Right Eye, Waste: 0.05 mL  Post-op Post injection exam found visual acuity of at least counting fingers. The patient tolerated the procedure well. There were no complications. The patient received written and verbal post procedure care education. Post injection medications were not given.                 ASSESSMENT/PLAN:    ICD-10-CM   1. 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 Retinal edema  H35.81 OCT, Retina - OU - Both Eyes  3. Essential hypertension  I10   4. Hypertensive retinopathy of both eyes  H35.033   5. Combined forms of age-related cataract of both eyes  H25.813     1, 2. Severe Non-proliferative diabetic retinopathy, both eyes  - delayed follow up from Nov 2020 to April 2021 - 5 months instead of 4 weeks due to having a kidney transplant in December 2020  - initial exam showed massive central DME OU and scattered IRH and IRMA  - FA 6.5.19 with patches of capillary nonperfusion; extensive Mas with late leakage OU; no frank NV  - FA 01.02.20, shows improvement in late leaking microaneurysms OU  - S/P IVA OS #1 (06.05.19), #2 (07.08.19), #3 (07.08.19), #4 (09.03.19)  - S/P IVA OD #1 (06.07.19), #2 (07.08.19), #3 (07.08.19)  - S/P IVE OD #1 (09.03.19), #2 (10.03.19), #3 (11.04.19), #4 (12.02.19), #5 (01.02.20), #6 (02.07.20), #7 (03.16.20), #8 (05.20.20), #9 (06.22.20), #10 (07.27.20), #11(09.09.10), # 12 (10.28.20), #13 (11.25.20), #14 (04.06.21) - sample, #15 (05.04.21) - sample, #16 (06.18.21), #17 *07.30.21)  - S/P IVE OS #1 (10.03.19), #2 (11.04.19), #3 (12.02.19), #4 (01.02.20), #5 (02.07.20), #6 (03.16.20), #7 (05.20.20), #8 (06.22.20),#9  (07.27.20), #10 (09.09.20), #11 (10.28.20), #12 (11.25.20)  - OCT shows OD shows persistent IRF, +ERM; OS: mild interval improvement in IRF ST macula  - BCVA: OD 20/40 (down from 20/30),  OS 20/25  - recommend IVE OD #18 today, 09.10.21, w/ dec in interval to 4-5 wks   - will hold off on injection OS again today -- IRF improving without therapy and BCVA 20/25  - pt in agreement  - RBA of procedure discussed, questions answered  - informed consent obtained and signed  - see procedure note -- tolerated well  - Eylea paperwork and benefits investigation started on 08.05.19 -- approved for 2021   - f/u in 4-5 weeks -- DFE/OCT/possible IVE  3,4. Hypertensive retinopathy OU  - discussed importance of tight BP control  - had some Bps in hypertensive emergency range (200s / 110s)  - BP improved post kidney transplant  - discussed likely contribution to DME  - monitor             - BP management per nephrology, PCP and cardiology  5. Combined form age-related cataract OU-   - The symptoms of cataract, surgical options, and treatments and risks were discussed with patient.  - discussed diagnosis and progression  - not yet visually significant  - monitor for now   Ophthalmic Meds Ordered this visit:  Meds ordered this encounter  Medications  . aflibercept (EYLEA) SOLN 2 mg       Return for  f/u 4-5 weeks, NPDR OU, DFE, OCT.  There are no Patient Instructions on file for this visit.   Explained the diagnoses, plan, and follow up with the patient and they expressed understanding.  Patient expressed understanding of the importance of proper follow up care.   This document serves as a record of services personally performed by Gardiner Sleeper, MD, PhD. It was created on their behalf by San Jetty. Owens Shark, OA an ophthalmic technician. The creation of this record is the provider's dictation and/or activities during the visit.    Electronically signed by: San Jetty. Owens Shark, [place] 09.10.2021 9:58  PM  Gardiner Sleeper, M.D., Ph.D. Diseases & Surgery of the Retina and Vitreous [facility]  I have reviewed the above documentation for accuracy and completeness, and I agree with the above. Gardiner Sleeper, M.D., Ph.D. 12/13/19 9:58 PM   Abbreviations: M myopia (nearsighted); A astigmatism; H hyperopia (farsighted); P presbyopia; Mrx spectacle prescription;  CTL contact lenses; OD right eye; OS left eye; OU both eyes  XT exotropia; ET esotropia; PEK punctate epithelial keratitis; PEE punctate epithelial erosions; DES dry eye syndrome; MGD meibomian gland dysfunction; ATs artificial tears; PFAT's preservative free artificial tears; [place] nuclear sclerotic cataract; PSC posterior subcapsular cataract; ERM epi-retinal membrane; PVD posterior vitreous detachment; RD retinal detachment; DM diabetes mellitus; DR diabetic retinopathy; NPDR non-proliferative diabetic retinopathy; PDR proliferative diabetic retinopathy; CSME clinically significant macular edema; DME diabetic macular edema; dbh dot blot hemorrhages; CWS cotton wool spot; POAG primary open angle glaucoma; C/D cup-to-disc ratio; HVF humphrey visual field; GVF goldmann visual field; OCT optical coherence tomography; IOP intraocular pressure; BRVO Branch retinal vein occlusion; CRVO central retinal vein occlusion; CRAO central retinal artery occlusion; BRAO branch retinal artery occlusion; RT retinal tear; SB scleral buckle; PPV pars plana vitrectomy; VH Vitreous hemorrhage; PRP panretinal laser photocoagulation; IVK intravitreal kenalog; VMT vitreomacular traction; MH Macular hole;  NVD neovascularization of the disc; NVE neovascularization elsewhere; AREDS age related eye disease study; ARMD age related macular degeneration; POAG primary open angle glaucoma; EBMD epithelial/anterior basement membrane dystrophy; ACIOL anterior chamber intraocular lens; IOL intraocular lens; PCIOL posterior chamber intraocular lens; Phaco/IOL  phacoemulsification with intraocular lens placement; [place] photorefractive keratectomy; LASIK laser assisted in situ keratomileusis; HTN hypertension; DM diabetes mellitus; COPD chronic obstructive pulmonary disease

## 2020-01-17 ENCOUNTER — Encounter (INDEPENDENT_AMBULATORY_CARE_PROVIDER_SITE_OTHER): Payer: Medicare Other | Admitting: Ophthalmology

## 2020-01-17 ENCOUNTER — Encounter (INDEPENDENT_AMBULATORY_CARE_PROVIDER_SITE_OTHER): Payer: BC Managed Care – PPO | Admitting: Ophthalmology

## 2020-01-17 DIAGNOSIS — E113413 Type 2 diabetes mellitus with severe nonproliferative diabetic retinopathy with macular edema, bilateral: Secondary | ICD-10-CM

## 2020-01-17 DIAGNOSIS — H3581 Retinal edema: Secondary | ICD-10-CM

## 2020-01-17 DIAGNOSIS — I10 Essential (primary) hypertension: Secondary | ICD-10-CM

## 2020-01-17 DIAGNOSIS — H35033 Hypertensive retinopathy, bilateral: Secondary | ICD-10-CM

## 2020-01-17 DIAGNOSIS — H25813 Combined forms of age-related cataract, bilateral: Secondary | ICD-10-CM

## 2020-01-31 ENCOUNTER — Encounter (INDEPENDENT_AMBULATORY_CARE_PROVIDER_SITE_OTHER): Payer: BC Managed Care – PPO | Admitting: Ophthalmology

## 2020-01-31 DIAGNOSIS — I10 Essential (primary) hypertension: Secondary | ICD-10-CM

## 2020-01-31 DIAGNOSIS — H35033 Hypertensive retinopathy, bilateral: Secondary | ICD-10-CM

## 2020-01-31 DIAGNOSIS — H3581 Retinal edema: Secondary | ICD-10-CM

## 2020-01-31 DIAGNOSIS — H25813 Combined forms of age-related cataract, bilateral: Secondary | ICD-10-CM

## 2020-01-31 DIAGNOSIS — E113413 Type 2 diabetes mellitus with severe nonproliferative diabetic retinopathy with macular edema, bilateral: Secondary | ICD-10-CM

## 2020-02-11 NOTE — Progress Notes (Addendum)
[facility] Note  02/14/2020     CHIEF COMPLAINT Patient presents for Retina Follow Up   HISTORY OF PRESENT ILLNESS: Philip Wilson is a 49 y.o. male who presents to the clinic today for:   HPI    Retina Follow Up    Patient presents with  Diabetic Retinopathy.  In both eyes.  This started 9 weeks ago.  I, the attending physician,  performed the HPI with the patient and updated documentation appropriately.          Comments    Patient here for 9 weeks retina follow up for NPDR OU. Patient states vision about the same. One eye is deteriorating. No eye pain.       Last edited by Bernarda Caffey, MD on 02/14/2020  9:47 PM. (History)      Referring physician: Lillard Anes, MD [address] [place],  [place] [address]  HISTORICAL INFORMATION:   Selected notes from the MEDICAL RECORD NUMBER Referred by Dr. Ricki Hanel for diabetic retinopathy OU   CURRENT MEDICATIONS: No current outpatient medications on file. (Ophthalmic Drugs)   Current Facility-Administered Medications (Ophthalmic Drugs)  Medication Route  . aflibercept (EYLEA) SOLN 2 mg Intravitreal  . aflibercept (EYLEA) SOLN 2 mg Intravitreal  . aflibercept (EYLEA) SOLN 2 mg Intravitreal  . aflibercept (EYLEA) SOLN 2 mg Intravitreal  . aflibercept (EYLEA) SOLN 2 mg Intravitreal  . aflibercept (EYLEA) SOLN 2 mg Intravitreal  . aflibercept (EYLEA) SOLN 2 mg Intravitreal  . aflibercept (EYLEA) SOLN 2 mg Intravitreal  . aflibercept (EYLEA) SOLN 2 mg Intravitreal  . aflibercept (EYLEA) SOLN 2 mg Intravitreal  . aflibercept (EYLEA) SOLN 2 mg Intravitreal   Current Outpatient Medications (Other)  Medication Sig  . ACCU-CHEK GUIDE test strip every morning. Use to test fasting blood glucose  . AgaMatrix Ultra-Thin Lancets MISC 200 each by Misc.(Non-Drug; Combo Route) route 4 times daily. One touch delica  . aspirin 81 MG EC tablet Take by mouth.  Marland Kitchen atorvastatin  (LIPITOR) 40 MG tablet TAKE 1 TABLET BY MOUTH DAILY  . B-D UF III MINI PEN NEEDLES 31G X 5 MM MISC   . Blood Glucose Monitoring Suppl (FIFTY50 GLUCOSE METER 2.0) w/Device KIT 1 each by Other route 4 times daily. Use as instructed one touch ultra  . cloNIDine (CATAPRES) 0.1 MG tablet Take 0.1 mg by mouth 2 (two) times daily.   . dapsone 25 MG tablet Take 50 mg by mouth daily.  . fluconazole (DIFLUCAN) 50 MG tablet   . furosemide (LASIX) 40 MG tablet Take 40 mg by mouth daily.  Marland Kitchen glimepiride (AMARYL) 4 MG tablet Take 4 mg by mouth daily with breakfast.   . HYDROcodone-acetaminophen (NORCO/VICODIN) 5-325 MG tablet Take 1 tablet by mouth every 4 (four) hours as needed for moderate pain.  . Insulin Glargine (BASAGLAR KWIKPEN) 100 UNIT/ML Inject into the skin.  Marland Kitchen insulin lispro (HUMALOG) 100 UNIT/ML injection Inject 5 units with breakfast and dinner and 4 units with lunch plus sliding scale as follows: BG 100-150 (1 unit); 151-200 (3 unit); 201-250 (4 unit); 251-300 (5 unit); 251-300 (6 unit); 351-400 (8 unit)  . K Phos Mono-Sod Phos Di & Mono (PHOSPHA 250 NEUTRAL) 155-852-130 MG TABS   . labetalol (NORMODYNE) 200 MG tablet Take 200 mg by mouth 3 (three) times daily.   Marland Kitchen lisinopril (PRINIVIL,ZESTRIL) 20 MG tablet Take 20 mg by mouth 2 (two) times a day.   Marland Kitchen LOKELMA 10 g PACK  packet Take 1 packet by mouth daily.  . magnesium oxide (MAG-OX) 400 MG tablet Take 1 tablet by mouth 2 (two) times daily.  . metFORMIN (GLUCOPHAGE) 1000 MG tablet Take 1,000 mg by mouth daily.  (Patient not taking: Reported on 09/20/2019)  . metFORMIN (GLUCOPHAGE-XR) 500 MG 24 hr tablet Take 500 mg by mouth 2 (two) times daily.  . Multiple Vitamin (MULTIVITAMIN WITH MINERALS) TABS tablet Take 1 tablet by mouth daily.  . Multiple Vitamin (MULTIVITAMIN) capsule Take by mouth. (Patient not taking: Reported on 09/20/2019)  . mycophenolate (MYFORTIC) 180 MG EC tablet Take by mouth.  Marland Kitchen NIFEdipine (PROCARDIA-XL/NIFEDICAL-XL) 30 MG 24 hr  tablet Take 30 mg by mouth daily.  Marland Kitchen NOVOLOG FLEXPEN 100 UNIT/ML FlexPen   . oxyCODONE (OXY IR/ROXICODONE) 5 MG immediate release tablet Take 5 mg by mouth every 4 (four) hours.  . pantoprazole (PROTONIX) 40 MG tablet Take by mouth.  . predniSONE (DELTASONE) 5 MG tablet Take by mouth.  . sodium bicarbonate 650 MG tablet Take by mouth.  . sulfamethoxazole-trimethoprim (BACTRIM) 400-80 MG tablet   . Tacrolimus ER 1 MG TB24 Take by mouth.  . valGANciclovir (VALCYTE) 450 MG tablet Take by mouth.   Current Facility-Administered Medications (Other)  Medication Route  . Bevacizumab (AVASTIN) SOLN 1.25 mg Intravitreal  . Bevacizumab (AVASTIN) SOLN 1.25 mg Intravitreal  . Bevacizumab (AVASTIN) SOLN 1.25 mg Intravitreal  . Bevacizumab (AVASTIN) SOLN 1.25 mg Intravitreal  . Bevacizumab (AVASTIN) SOLN 1.25 mg Intravitreal  . Bevacizumab (AVASTIN) SOLN 1.25 mg Intravitreal  . Bevacizumab (AVASTIN) SOLN 1.25 mg Intravitreal      REVIEW OF SYSTEMS: ROS    Positive for: Neurological, Genitourinary, Endocrine, Eyes   Negative for: Constitutional, Gastrointestinal, Skin, Musculoskeletal, HENT, Cardiovascular, Respiratory, Psychiatric, Allergic/Imm, Heme/Lymph   Last edited by Theodore Demark, COA on 02/14/2020  2:30 PM. (History)       ALLERGIES No Known Allergies  PAST MEDICAL HISTORY Past Medical History:  Diagnosis Date  . Cataract    OU  . Chronic kidney disease 05/04/2018  . Diabetes 1.5, managed as type 2 ([place])   . Diabetic retinopathy ([place])    NPDR OU  . Hypertension   . Hypertensive retinopathy    OU   Past Surgical History:  Procedure Laterality Date  . AV FISTULA PLACEMENT Left 09/28/2018   Procedure: Creation of Left arm Radiocephalic ARTERIOVENOUS (AV) FISTULA;  Surgeon: Marty Heck, MD;  Location: [facility];  Service: Vascular;  Laterality: Left;  Marland Kitchen MULTIPLE TOOTH EXTRACTIONS      FAMILY HISTORY Family History  Problem Relation Age of Onset  . Heart failure  Mother     SOCIAL HISTORY Social History   Tobacco Use  . Smoking status: Former Research scientist (life sciences)  . Smokeless tobacco: Never Used  . Tobacco comment: quit 20 years ago  Vaping Use  . Vaping Use: Never used  Substance Use Topics  . Alcohol use: Not Currently  . Drug use: Never         OPHTHALMIC EXAM:  Base Eye Exam    Visual Acuity (Snellen - Linear)      Right Left   Dist [place] 20/40 -2 20/40 -1   Dist ph [place] 20/30 +2 20/25 -1       Tonometry (Tonopen, 2:26 PM)      Right Left   Pressure 23 23  squeezing       Pupils      Dark Light Shape React APD   Right 3 2 Round Brisk  None   Left 3 2 Round Brisk None       Visual Fields (Counting fingers)      Left Right    Full Full       Extraocular Movement      Right Left    Full Full       Neuro/Psych    Oriented x3: Yes   Mood/Affect: Normal       Dilation    Both eyes: 1.0% Mydriacyl, 2.5% Phenylephrine @ 2:26 PM        Slit Lamp and Fundus Exam    Slit Lamp Exam      Right Left   Lids/Lashes Dermatochalasis - upper lid, mild Meibomian gland dysfunction Dermatochalasis - upper lid, mild Meibomian gland dysfunction   Conjunctiva/Sclera White and quiet White and quiet   Cornea Clear Clear   Anterior Chamber Deep and quiet, no cell or flare Deep and quiet, no cell or flare   Iris Round and dilated to 28m Round and dilated to 765m  Lens 1+ Nuclear sclerosis, 2+ Cortical cataract 1+ Nuclear sclerosis, 2+ Cortical cataract   Vitreous Mild Vitreous syneresis, Posterior vitreous detachment, Weiss ring Mild Vitreous syneresis, old white VH inferiorly       Fundus Exam      Right Left   Disc Pink and Sharp, mild pallor Pink and Sharp   C/D Ratio 0.2 0.3   Macula blunted foveal reflex, +ERM greatest superior to fovea, persistent cystic changes Flat, good foveal reflex, scattered MA, trace cystic changes temporal macula - improved   Vessels Tortuous, AV crossing changes, Vascular attenuation Tortuous, AV crossing  changes   Periphery Attached, scattered MAs and DBH - mostly posterior, No RT/RD Attached, rare MA          IMAGING AND PROCEDURES  Imaging and Procedures for '@TODAY' @  OCT, Retina - OU - Both Eyes       Right Eye Quality was good. Central Foveal Thickness: 363. Progression has worsened. Findings include intraretinal fluid, retinal drusen , intraretinal hyper-reflective material, no SRF, abnormal foveal contour, epiretinal membrane (Mild interval increase in IRF; +ERM).   Left Eye Quality was good. Central Foveal Thickness: 260. Progression has improved. Findings include intraretinal fluid, epiretinal membrane, no SRF, intraretinal hyper-reflective material, normal foveal contour (Persistent IRF slightly improved ST macula).   Notes *Images captured and stored on drive  Diagnosis / Impression:  DME OU OD: Mild interval increase in IRF; +ERM OS: Persistent IRF slightly improved ST macula  Clinical management:  See below  Abbreviations: NFP - Normal foveal profile. CME - cystoid macular edema. PED - pigment epithelial detachment. IRF - intraretinal fluid. SRF - subretinal fluid. EZ - ellipsoid zone. ERM - epiretinal membrane. ORA - outer retinal atrophy. ORT - outer retinal tubulation. SRHM - subretinal hyper-reflective material        Intravitreal Injection, Pharmacologic Agent - OD - Right Eye       Time Out 02/14/2020. 3:31 PM. Confirmed correct patient, procedure, site, and patient consented.   Anesthesia Topical anesthesia was used. Anesthetic medications included Lidocaine 2%, Proparacaine 0.5%.   Procedure Preparation included 5% betadine to ocular surface, eyelid speculum. A (32g) needle was used.   Injection:  2 mg aflibercept (EAlfonse FlavorsSOLN   NDC: [id number]: [id number] date: 05/05/2020   Route: Intravitreal, Site: Right Eye, Waste: 0.05 mL  Post-op Post injection exam found visual acuity of at least counting fingers. The patient tolerated  the procedure well. There were no  complications. The patient received written and verbal post procedure care education. Post injection medications were not given.                 ASSESSMENT/PLAN:    ICD-10-CM   1. Severe nonproliferative diabetic retinopathy of both eyes with macular edema associated with type 2 diabetes mellitus (HCC)  E56.3149 Intravitreal Injection, Pharmacologic Agent - OD - Right Eye    aflibercept (EYLEA) SOLN 2 mg  2. Retinal edema  H35.81 OCT, Retina - OU - Both Eyes  3. Essential hypertension  I10   4. Hypertensive retinopathy of both eyes  H35.033   5. Combined forms of age-related cataract of both eyes  H25.813     1, 2. Severe Non-proliferative diabetic retinopathy, both eyes  - Delayed f/u, 8 wks instead of 4-5 wks  - initial exam showed massive central DME OU and scattered IRH and IRMA  - FA 6.5.19 with patches of capillary nonperfusion; extensive Mas with late leakage OU; no frank NV  - FA 01.02.20, shows improvement in late leaking microaneurysms OU  - S/P IVA OS #1 (06.05.19), #2 (07.08.19), #3 (07.08.19), #4 (09.03.19)  - S/P IVA OD #1 (06.07.19), #2 (07.08.19), #3 (07.08.19)  - S/P IVE OD #1 (09.03.19), #2 (10.03.19), #3 (11.04.19), #4 (12.02.19), #5 (01.02.20), #6 (02.07.20), #7 (03.16.20), #8 (05.20.20), #9 (06.22.20), #10 (07.27.20), #11(09.09.10), # 12 (10.28.20), #13 (11.25.20), #14 (04.06.21) - sample, #15 (05.04.21) - sample, #16 (06.18.21), #17 (07.30.21), #18 (09.10.21)  - S/P IVE OS #1 (10.03.19), #2 (11.04.19), #3 (12.02.19), #4 (01.02.20), #5 (02.07.20), #6 (03.16.20), #7 (05.20.20), #8 (06.22.20),#9 (07.27.20), #10 (09.09.20), #11 (10.28.20), #12 (11.25.20)  - OCT shows OD shows persistent IRF, +ERM; OS: mild interval improvement in IRF ST macula  - BCVA: OD 20/30,  OS 20/25  - recommend IVE OD #19 today, 11.16.21  - will hold off on injection OS again today -- IRF improving without therapy and BCVA 20/25  - pt in agreement  - RBA of  procedure discussed, questions answered  - informed consent obtained and signed  - see procedure note -- tolerated well  - Eylea paperwork and benefits investigation started on 08.05.19 -- approved for 2021   - f/u in 6-8 weeks -- DFE/OCT/possible IVE  3,4. Hypertensive retinopathy OU  - discussed importance of tight BP control  - had some Bps in hypertensive emergency range (200s / 110s)  - BP improved post kidney transplant  - discussed likely contribution to DME  - monitor             - BP management per nephrology, PCP and cardiology  5. Combined form age-related cataract OU-   - The symptoms of cataract, surgical options, and treatments and risks were discussed with patient.  - discussed diagnosis and progression  - not yet visually significant  - monitor for now  Ophthalmic Meds Ordered this visit:  Meds ordered this encounter  Medications  . aflibercept (EYLEA) SOLN 2 mg       Return for 6-8 wk f/u for NPDR OU w/DFE&OCT.  There are no Patient Instructions on file for this visit.   Explained the diagnoses, plan, and follow up with the patient and they expressed understanding.  Patient expressed understanding of the importance of proper follow up care.   This document serves as a record of services personally performed by Gardiner Sleeper, MD, PhD. It was created on their behalf by San Jetty. Owens Shark, OA an ophthalmic technician. The creation of this record is  the provider's dictation and/or activities during the visit.    Electronically signed by: San Jetty. Owens Shark, [place] 11.09.2021 9:52 PM   This document serves as a record of services personally performed by Gardiner Sleeper, MD, PhD. It was created on their behalf by Estill Bakes, COT an ophthalmic technician. The creation of this record is the provider's dictation and/or activities during the visit.    Electronically signed by: Estill Bakes, [place] 11.12.21 @ 9:52 PM  Gardiner Sleeper, M.D., Ph.D. Diseases & Surgery of the  Retina and Vitreous [facility]  I have reviewed the above documentation for accuracy and completeness, and I agree with the above. Gardiner Sleeper, M.D., Ph.D. 02/14/20 9:52 PM   Abbreviations: M myopia (nearsighted); A astigmatism; H hyperopia (farsighted); P presbyopia; Mrx spectacle prescription;  CTL contact lenses; OD right eye; OS left eye; OU both eyes  XT exotropia; ET esotropia; PEK punctate epithelial keratitis; PEE punctate epithelial erosions; DES dry eye syndrome; MGD meibomian gland dysfunction; ATs artificial tears; PFAT's preservative free artificial tears; [place] nuclear sclerotic cataract; PSC posterior subcapsular cataract; ERM epi-retinal membrane; PVD posterior vitreous detachment; RD retinal detachment; DM diabetes mellitus; DR diabetic retinopathy; NPDR non-proliferative diabetic retinopathy; PDR proliferative diabetic retinopathy; CSME clinically significant macular edema; DME diabetic macular edema; dbh dot blot hemorrhages; CWS cotton wool spot; POAG primary open angle glaucoma; C/D cup-to-disc ratio; HVF humphrey visual field; GVF goldmann visual field; OCT optical coherence tomography; IOP intraocular pressure; BRVO Branch retinal vein occlusion; CRVO central retinal vein occlusion; CRAO central retinal artery occlusion; BRAO branch retinal artery occlusion; RT retinal tear; SB scleral buckle; PPV pars plana vitrectomy; VH Vitreous hemorrhage; PRP panretinal laser photocoagulation; IVK intravitreal kenalog; VMT vitreomacular traction; MH Macular hole;  NVD neovascularization of the disc; NVE neovascularization elsewhere; AREDS age related eye disease study; ARMD age related macular degeneration; POAG primary open angle glaucoma; EBMD epithelial/anterior basement membrane dystrophy; ACIOL anterior chamber intraocular lens; IOL intraocular lens; PCIOL posterior chamber intraocular lens; Phaco/IOL phacoemulsification with intraocular lens placement; [place]  photorefractive keratectomy; LASIK laser assisted in situ keratomileusis; HTN hypertension; DM diabetes mellitus; COPD chronic obstructive pulmonary disease

## 2020-02-14 ENCOUNTER — Other Ambulatory Visit: Payer: Self-pay

## 2020-02-14 ENCOUNTER — Ambulatory Visit (INDEPENDENT_AMBULATORY_CARE_PROVIDER_SITE_OTHER): Payer: BC Managed Care – PPO | Admitting: Ophthalmology

## 2020-02-14 ENCOUNTER — Encounter (INDEPENDENT_AMBULATORY_CARE_PROVIDER_SITE_OTHER): Payer: Self-pay | Admitting: Ophthalmology

## 2020-02-14 DIAGNOSIS — H35033 Hypertensive retinopathy, bilateral: Secondary | ICD-10-CM

## 2020-02-14 DIAGNOSIS — E113413 Type 2 diabetes mellitus with severe nonproliferative diabetic retinopathy with macular edema, bilateral: Secondary | ICD-10-CM | POA: Diagnosis not present

## 2020-02-14 DIAGNOSIS — H3581 Retinal edema: Secondary | ICD-10-CM | POA: Diagnosis not present

## 2020-02-14 DIAGNOSIS — H25813 Combined forms of age-related cataract, bilateral: Secondary | ICD-10-CM

## 2020-02-14 DIAGNOSIS — I10 Essential (primary) hypertension: Secondary | ICD-10-CM | POA: Diagnosis not present

## 2020-02-14 MED ORDER — AFLIBERCEPT 2MG/0.05ML IZ SOLN FOR KALEIDOSCOPE
2.0000 mg | INTRAVITREAL | Status: AC | PRN
  Administered 2020-02-14: 2 mg via INTRAVITREAL

## 2020-04-08 NOTE — Progress Notes (Signed)
[facility] Note  04/10/2020     CHIEF COMPLAINT Patient presents for Retina Follow Up   HISTORY OF PRESENT ILLNESS: Philip Wilson is a 50 y.o. male who presents to the clinic today for:   HPI    Retina Follow Up    Patient presents with  Diabetic Retinopathy.  In both eyes.  This started 8 weeks ago.  I, the attending physician,  performed the HPI with the patient and updated documentation appropriately.          Comments    Patient here for 8 weeks retina follow up for NPDR OU. Patient states vision doing ok.  Can't tell any difference. No eye pain.        Last edited by Bernarda Caffey, MD on 04/10/2020  4:21 PM. (History)     Patient states vision the same OU.   Referring physician: Lillard Anes, MD [address] [place],  [place] [address]  HISTORICAL INFORMATION:   Selected notes from the MEDICAL RECORD NUMBER Referred by Dr. Ricki Ammar for diabetic retinopathy OU   CURRENT MEDICATIONS: No current outpatient medications on file. (Ophthalmic Drugs)   Current Facility-Administered Medications (Ophthalmic Drugs)  Medication Route  . aflibercept (EYLEA) SOLN 2 mg Intravitreal  . aflibercept (EYLEA) SOLN 2 mg Intravitreal  . aflibercept (EYLEA) SOLN 2 mg Intravitreal  . aflibercept (EYLEA) SOLN 2 mg Intravitreal  . aflibercept (EYLEA) SOLN 2 mg Intravitreal  . aflibercept (EYLEA) SOLN 2 mg Intravitreal  . aflibercept (EYLEA) SOLN 2 mg Intravitreal  . aflibercept (EYLEA) SOLN 2 mg Intravitreal  . aflibercept (EYLEA) SOLN 2 mg Intravitreal  . aflibercept (EYLEA) SOLN 2 mg Intravitreal  . aflibercept (EYLEA) SOLN 2 mg Intravitreal   Current Outpatient Medications (Other)  Medication Sig  . ACCU-CHEK GUIDE test strip every morning. Use to test fasting blood glucose  . AgaMatrix Ultra-Thin Lancets MISC 200 each by Misc.(Non-Drug; Combo Route) route 4 times daily. One touch delica  . aspirin 81 MG EC tablet Take by  mouth.  Marland Kitchen atorvastatin (LIPITOR) 40 MG tablet TAKE 1 TABLET BY MOUTH DAILY  . B-D UF III MINI PEN NEEDLES 31G X 5 MM MISC   . Blood Glucose Monitoring Suppl (FIFTY50 GLUCOSE METER 2.0) w/Device KIT 1 each by Other route 4 times daily. Use as instructed one touch ultra  . cloNIDine (CATAPRES) 0.1 MG tablet Take 0.1 mg by mouth 2 (two) times daily.   . dapsone 25 MG tablet Take 50 mg by mouth daily.  . fluconazole (DIFLUCAN) 50 MG tablet   . furosemide (LASIX) 40 MG tablet Take 40 mg by mouth daily.  Marland Kitchen glimepiride (AMARYL) 4 MG tablet Take 4 mg by mouth daily with breakfast.   . HYDROcodone-acetaminophen (NORCO/VICODIN) 5-325 MG tablet Take 1 tablet by mouth every 4 (four) hours as needed for moderate pain.  . Insulin Glargine (BASAGLAR KWIKPEN) 100 UNIT/ML Inject into the skin.  Marland Kitchen insulin lispro (HUMALOG) 100 UNIT/ML injection Inject 5 units with breakfast and dinner and 4 units with lunch plus sliding scale as follows: BG 100-150 (1 unit); 151-200 (3 unit); 201-250 (4 unit); 251-300 (5 unit); 251-300 (6 unit); 351-400 (8 unit)  . K Phos Mono-Sod Phos Di & Mono (PHOSPHA 250 NEUTRAL) 155-852-130 MG TABS   . labetalol (NORMODYNE) 200 MG tablet Take 200 mg by mouth 3 (three) times daily.   Marland Kitchen lisinopril (PRINIVIL,ZESTRIL) 20 MG tablet Take 20 mg by mouth 2 (two) times a  day.   Marland Kitchen LOKELMA 10 g PACK packet Take 1 packet by mouth daily.  . magnesium oxide (MAG-OX) 400 MG tablet Take 1 tablet by mouth 2 (two) times daily.  . metFORMIN (GLUCOPHAGE) 1000 MG tablet Take 1,000 mg by mouth daily.  (Patient not taking: Reported on 09/20/2019)  . metFORMIN (GLUCOPHAGE-XR) 500 MG 24 hr tablet Take 500 mg by mouth 2 (two) times daily.  . Multiple Vitamin (MULTIVITAMIN WITH MINERALS) TABS tablet Take 1 tablet by mouth daily.  . Multiple Vitamin (MULTIVITAMIN) capsule Take by mouth. (Patient not taking: Reported on 09/20/2019)  . mycophenolate (MYFORTIC) 180 MG EC tablet Take by mouth.  Marland Kitchen NIFEdipine  (PROCARDIA-XL/NIFEDICAL-XL) 30 MG 24 hr tablet Take 30 mg by mouth daily.  Marland Kitchen NOVOLOG FLEXPEN 100 UNIT/ML FlexPen   . oxyCODONE (OXY IR/ROXICODONE) 5 MG immediate release tablet Take 5 mg by mouth every 4 (four) hours.  . pantoprazole (PROTONIX) 40 MG tablet Take by mouth.  . predniSONE (DELTASONE) 5 MG tablet Take by mouth.  . sodium bicarbonate 650 MG tablet Take by mouth.  . sulfamethoxazole-trimethoprim (BACTRIM) 400-80 MG tablet   . Tacrolimus ER 1 MG TB24 Take by mouth.  . valGANciclovir (VALCYTE) 450 MG tablet Take by mouth.   Current Facility-Administered Medications (Other)  Medication Route  . Bevacizumab (AVASTIN) SOLN 1.25 mg Intravitreal  . Bevacizumab (AVASTIN) SOLN 1.25 mg Intravitreal  . Bevacizumab (AVASTIN) SOLN 1.25 mg Intravitreal  . Bevacizumab (AVASTIN) SOLN 1.25 mg Intravitreal  . Bevacizumab (AVASTIN) SOLN 1.25 mg Intravitreal  . Bevacizumab (AVASTIN) SOLN 1.25 mg Intravitreal  . Bevacizumab (AVASTIN) SOLN 1.25 mg Intravitreal      REVIEW OF SYSTEMS: ROS    Positive for: Neurological, Genitourinary, Endocrine, Eyes   Negative for: Constitutional, Gastrointestinal, Skin, Musculoskeletal, HENT, Cardiovascular, Respiratory, Psychiatric, Allergic/Imm, Heme/Lymph   Last edited by Theodore Demark, COA on 04/10/2020  2:34 PM. (History)       ALLERGIES No Known Allergies  PAST MEDICAL HISTORY Past Medical History:  Diagnosis Date  . Cataract    OU  . Chronic kidney disease 05/04/2018  . Diabetes 1.5, managed as type 2 ([place])   . Diabetic retinopathy ([place])    NPDR OU  . Hypertension   . Hypertensive retinopathy    OU   Past Surgical History:  Procedure Laterality Date  . AV FISTULA PLACEMENT Left 09/28/2018   Procedure: Creation of Left arm Radiocephalic ARTERIOVENOUS (AV) FISTULA;  Surgeon: Marty Heck, MD;  Location: [facility];  Service: Vascular;  Laterality: Left;  Marland Kitchen MULTIPLE TOOTH EXTRACTIONS      FAMILY HISTORY Family History  Problem  Relation Age of Onset  . Heart failure Mother     SOCIAL HISTORY Social History   Tobacco Use  . Smoking status: Former Research scientist (life sciences)  . Smokeless tobacco: Never Used  . Tobacco comment: quit 20 years ago  Vaping Use  . Vaping Use: Never used  Substance Use Topics  . Alcohol use: Not Currently  . Drug use: Never         OPHTHALMIC EXAM:  Base Eye Exam    Visual Acuity (Snellen - Linear)      Right Left   Dist [place] 20/30 -2 20/40 -2   Dist ph  NI 20/20 -1       Tonometry (Tonopen, 2:31 PM)      Right Left   Pressure 20 22       Pupils      Dark Light Shape React APD  Right 3 2 Round Brisk None   Left 3 2 Round Brisk None       Visual Fields (Counting fingers)      Left Right    Full Full       Extraocular Movement      Right Left    Full Full       Neuro/Psych    Oriented x3: Yes   Mood/Affect: Normal       Dilation    Both eyes: 1.0% Mydriacyl, 2.5% Phenylephrine @ 2:31 PM        Slit Lamp and Fundus Exam    Slit Lamp Exam      Right Left   Lids/Lashes Dermatochalasis - upper lid, mild Meibomian gland dysfunction Dermatochalasis - upper lid, mild Meibomian gland dysfunction   Conjunctiva/Sclera White and quiet White and quiet   Cornea Clear Clear   Anterior Chamber Deep and quiet, no cell or flare Deep and quiet, no cell or flare   Iris Round and dilated to 103m Round and dilated to 736m  Lens 1+ Nuclear sclerosis, 2+ Cortical cataract 1+ Nuclear sclerosis, 2+ Cortical cataract   Vitreous Mild Vitreous syneresis, Posterior vitreous detachment, Weiss ring Mild Vitreous syneresis, old white VH inferiorly       Fundus Exam      Right Left   Disc Pink and Sharp, mild pallor Pink and Sharp   C/D Ratio 0.2 0.3   Macula blunted foveal reflex, +ERM greatest superior to fovea, persistent cystic changes Flat, good foveal reflex, scattered MA, trace cystic changes temporal macula - improved   Vessels Tortuous, AV crossing changes, Vascular attenuation  Tortuous, AV crossing changes   Periphery Attached, scattered MAs and DBH - mostly posterior, No RT/RD Attached, rare MA          IMAGING AND PROCEDURES  Imaging and Procedures for '@TODAY' @  OCT, Retina - OU - Both Eyes       Right Eye Quality was good. Central Foveal Thickness: 367. Progression has been stable. Findings include intraretinal fluid, retinal drusen , intraretinal hyper-reflective material, no SRF, abnormal foveal contour, epiretinal membrane, macular pucker (Persistent cystic changes/IRF; +ERM).   Left Eye Quality was good. Central Foveal Thickness: 2623. Progression has been stable. Findings include intraretinal fluid, epiretinal membrane, no SRF, intraretinal hyper-reflective material, normal foveal contour (Persistent IRF ST macula).   Notes *Images captured and stored on drive  Diagnosis / Impression:  DME OU OD: Persistent cystic changes/IRF; +ERM OS: Persistent IRF ST macula -- non central  Clinical management:  See below  Abbreviations: NFP - Normal foveal profile. CME - cystoid macular edema. PED - pigment epithelial detachment. IRF - intraretinal fluid. SRF - subretinal fluid. EZ - ellipsoid zone. ERM - epiretinal membrane. ORA - outer retinal atrophy. ORT - outer retinal tubulation. SRHM - subretinal hyper-reflective material        Intravitreal Injection, Pharmacologic Agent - OD - Right Eye       Time Out 04/10/2020. 3:13 PM. Confirmed correct patient, procedure, site, and patient consented.   Anesthesia Topical anesthesia was used. Anesthetic medications included Lidocaine 2%, Proparacaine 0.5%.   Procedure Preparation included 5% betadine to ocular surface, eyelid speculum. A (32 g) needle was used.   Injection:  2 mg aflibercept (EAlfonse FlavorsSOLN   NDC: [id number]: [id number] date: 08/01/2020   Route: Intravitreal, Site: Right Eye, Waste: 0.05 mL  Post-op Post injection exam found visual acuity of at least counting  fingers. The patient tolerated the  procedure well. There were no complications. The patient received written and verbal post procedure care education. Post injection medications were not given.                 ASSESSMENT/PLAN:    ICD-10-CM   1. Severe nonproliferative diabetic retinopathy of both eyes with macular edema associated with type 2 diabetes mellitus (HCC)  Z61.0960 Intravitreal Injection, Pharmacologic Agent - OD - Right Eye    aflibercept (EYLEA) SOLN 2 mg  2. Retinal edema  H35.81 OCT, Retina - OU - Both Eyes  3. Essential hypertension  I10   4. Hypertensive retinopathy of both eyes  H35.033   5. Combined forms of age-related cataract of both eyes  H25.813     1, 2. Severe Non-proliferative diabetic retinopathy, both eyes  - initial exam showed massive central DME OU and scattered IRH and IRMA  - FA 6.5.19 with patches of capillary nonperfusion; extensive Mas with late leakage OU; no frank NV  - FA 01.02.20, shows improvement in late leaking microaneurysms OU  - S/P IVA OS #1 (06.05.19), #2 (07.08.19), #3 (07.08.19), #4 (09.03.19)  - S/P IVA OD #1 (06.07.19), #2 (07.08.19), #3 (07.08.19)  - S/P IVE OD #1 (09.03.19), #2 (10.03.19), #3 (11.04.19), #4 (12.02.19), #5 (01.02.20), #6 (02.07.20), #7 (03.16.20), #8 (05.20.20), #9 (06.22.20), #10 (07.27.20), #11(09.09.10), # 12 (10.28.20), #13 (11.25.20), #14 (04.06.21) - sample, #15 (05.04.21) - sample, #16 (06.18.21), #17 (07.30.21), #18 (09.10.21), #19 (11.16.21)  - S/P IVE OS #1 (10.03.19), #2 (11.04.19), #3 (12.02.19), #4 (01.02.20), #5 (02.07.20), #6 (03.16.20), #7 (05.20.20), #8 (06.22.20),#9 (07.27.20), #10 (09.09.20), #11 (10.28.20), #12 (11.25.20)  - OCT shows OD shows persistent IRF, +ERM at 8 wk interval; OS: mild interval improvement in IRF ST macula -- non central  - BCVA: OD 20/30-2 (stable),  OS 20/20-1 (slightly improved from 20/25-1)  - recommend IVE OD #20 today, 01.07.22 with f/u in 6-8 wks (stable edema)  -  will hold off on injection OS again today -- IRF improving without therapy and BCVA 20/20-1  - pt in agreement  - RBA of procedure discussed, questions answered  - informed consent obtained and signed  - see procedure note -- tolerated well  - Eylea paperwork and benefits investigation started on 08.05.19 -- approved for 2021   - f/u in 6-8 weeks -- DFE/OCT/possible IVE -- decrease interval if edema or vision worsens  3,4. Hypertensive retinopathy OU  - discussed importance of tight BP control  - had some Bps in hypertensive emergency range (200s / 110s)  - BP improved post kidney transplant  - discussed likely contribution to DME  - monitor             - BP management per nephrology, PCP and cardiology  5. Combined form age-related cataract OU-   - The symptoms of cataract, surgical options, and treatments and risks were discussed with patient.  - discussed diagnosis and progression  - not yet visually significant  - monitor for now  Ophthalmic Meds Ordered this visit:  Meds ordered this encounter  Medications  . aflibercept (EYLEA) SOLN 2 mg       Return for 6-8 weeks DFE, OCT.  There are no Patient Instructions on file for this visit.   Explained the diagnoses, plan, and follow up with the patient and they expressed understanding.  Patient expressed understanding of the importance of proper follow up care.   This document serves as a record of services personally performed by Gardiner Sleeper, MD,  PhD. It was created on their behalf by San Jetty. Owens Shark, OA an ophthalmic technician. The creation of this record is the provider's dictation and/or activities during the visit.    Electronically signed by: San Jetty. Owens Shark, [place] 01.05.2022 4:24 PM   This document serves as a record of services personally performed by Gardiner Sleeper, MD, PhD. It was created on their behalf by Roselee Nova, COMT. The creation of this record is the provider's dictation and/or activities during the  visit.  Electronically signed by: Roselee Nova, COMT 04/10/20 4:24 PM  Gardiner Sleeper, M.D., Ph.D. Diseases & Surgery of the Retina and Vitreous [facility]  I have reviewed the above documentation for accuracy and completeness, and I agree with the above. Gardiner Sleeper, M.D., Ph.D. 04/10/20 4:24 PM   Abbreviations: M myopia (nearsighted); A astigmatism; H hyperopia (farsighted); P presbyopia; Mrx spectacle prescription;  CTL contact lenses; OD right eye; OS left eye; OU both eyes  XT exotropia; ET esotropia; PEK punctate epithelial keratitis; PEE punctate epithelial erosions; DES dry eye syndrome; MGD meibomian gland dysfunction; ATs artificial tears; PFAT's preservative free artificial tears; [place] nuclear sclerotic cataract; PSC posterior subcapsular cataract; ERM epi-retinal membrane; PVD posterior vitreous detachment; RD retinal detachment; DM diabetes mellitus; DR diabetic retinopathy; NPDR non-proliferative diabetic retinopathy; PDR proliferative diabetic retinopathy; CSME clinically significant macular edema; DME diabetic macular edema; dbh dot blot hemorrhages; CWS cotton wool spot; POAG primary open angle glaucoma; C/D cup-to-disc ratio; HVF humphrey visual field; GVF goldmann visual field; OCT optical coherence tomography; IOP intraocular pressure; BRVO Branch retinal vein occlusion; CRVO central retinal vein occlusion; CRAO central retinal artery occlusion; BRAO branch retinal artery occlusion; RT retinal tear; SB scleral buckle; PPV pars plana vitrectomy; VH Vitreous hemorrhage; PRP panretinal laser photocoagulation; IVK intravitreal kenalog; VMT vitreomacular traction; MH Macular hole;  NVD neovascularization of the disc; NVE neovascularization elsewhere; AREDS age related eye disease study; ARMD age related macular degeneration; POAG primary open angle glaucoma; EBMD epithelial/anterior basement membrane dystrophy; ACIOL anterior chamber intraocular lens; IOL  intraocular lens; PCIOL posterior chamber intraocular lens; Phaco/IOL phacoemulsification with intraocular lens placement; [place] photorefractive keratectomy; LASIK laser assisted in situ keratomileusis; HTN hypertension; DM diabetes mellitus; COPD chronic obstructive pulmonary disease

## 2020-04-10 ENCOUNTER — Other Ambulatory Visit: Payer: Self-pay

## 2020-04-10 ENCOUNTER — Ambulatory Visit (INDEPENDENT_AMBULATORY_CARE_PROVIDER_SITE_OTHER): Payer: BC Managed Care – PPO | Admitting: Ophthalmology

## 2020-04-10 ENCOUNTER — Encounter (INDEPENDENT_AMBULATORY_CARE_PROVIDER_SITE_OTHER): Payer: Self-pay | Admitting: Ophthalmology

## 2020-04-10 DIAGNOSIS — H35033 Hypertensive retinopathy, bilateral: Secondary | ICD-10-CM | POA: Diagnosis not present

## 2020-04-10 DIAGNOSIS — I10 Essential (primary) hypertension: Secondary | ICD-10-CM | POA: Diagnosis not present

## 2020-04-10 DIAGNOSIS — E113413 Type 2 diabetes mellitus with severe nonproliferative diabetic retinopathy with macular edema, bilateral: Secondary | ICD-10-CM

## 2020-04-10 DIAGNOSIS — H3581 Retinal edema: Secondary | ICD-10-CM | POA: Diagnosis not present

## 2020-04-10 DIAGNOSIS — H25813 Combined forms of age-related cataract, bilateral: Secondary | ICD-10-CM

## 2020-04-10 MED ORDER — AFLIBERCEPT 2MG/0.05ML IZ SOLN FOR KALEIDOSCOPE
2.0000 mg | INTRAVITREAL | Status: AC | PRN
  Administered 2020-04-10: 2 mg via INTRAVITREAL

## 2020-05-25 NOTE — Progress Notes (Shared)
[facility] Note  05/29/2020     CHIEF COMPLAINT Patient presents for No chief complaint on file.   HISTORY OF PRESENT ILLNESS: Philip Wilson is a 50 y.o. male who presents to the clinic today for:    Patient states vision the same OU.   Referring physician: Lillard Anes, MD [address] [place],  [place] [address]  HISTORICAL INFORMATION:   Selected notes from the MEDICAL RECORD NUMBER Referred by Dr. Ricki Fuhrmann for diabetic retinopathy OU   CURRENT MEDICATIONS: No current outpatient medications on file. (Ophthalmic Drugs)   Current Facility-Administered Medications (Ophthalmic Drugs)  Medication Route  . aflibercept (EYLEA) SOLN 2 mg Intravitreal  . aflibercept (EYLEA) SOLN 2 mg Intravitreal  . aflibercept (EYLEA) SOLN 2 mg Intravitreal  . aflibercept (EYLEA) SOLN 2 mg Intravitreal  . aflibercept (EYLEA) SOLN 2 mg Intravitreal  . aflibercept (EYLEA) SOLN 2 mg Intravitreal  . aflibercept (EYLEA) SOLN 2 mg Intravitreal  . aflibercept (EYLEA) SOLN 2 mg Intravitreal  . aflibercept (EYLEA) SOLN 2 mg Intravitreal  . aflibercept (EYLEA) SOLN 2 mg Intravitreal  . aflibercept (EYLEA) SOLN 2 mg Intravitreal   Current Outpatient Medications (Other)  Medication Sig  . ACCU-CHEK GUIDE test strip every morning. Use to test fasting blood glucose  . AgaMatrix Ultra-Thin Lancets MISC 200 each by Misc.(Non-Drug; Combo Route) route 4 times daily. One touch delica  . aspirin 81 MG EC tablet Take by mouth.  Marland Kitchen atorvastatin (LIPITOR) 40 MG tablet TAKE 1 TABLET BY MOUTH DAILY  . B-D UF III MINI PEN NEEDLES 31G X 5 MM MISC   . Blood Glucose Monitoring Suppl (FIFTY50 GLUCOSE METER 2.0) w/Device KIT 1 each by Other route 4 times daily. Use as instructed one touch ultra  . cloNIDine (CATAPRES) 0.1 MG tablet Take 0.1 mg by mouth 2 (two) times daily.   . dapsone 25 MG tablet Take 50 mg by mouth daily.  . fluconazole (DIFLUCAN) 50 MG tablet    . furosemide (LASIX) 40 MG tablet Take 40 mg by mouth daily.  Marland Kitchen glimepiride (AMARYL) 4 MG tablet Take 4 mg by mouth daily with breakfast.   . HYDROcodone-acetaminophen (NORCO/VICODIN) 5-325 MG tablet Take 1 tablet by mouth every 4 (four) hours as needed for moderate pain.  . Insulin Glargine (BASAGLAR KWIKPEN) 100 UNIT/ML Inject into the skin.  Marland Kitchen insulin lispro (HUMALOG) 100 UNIT/ML injection Inject 5 units with breakfast and dinner and 4 units with lunch plus sliding scale as follows: BG 100-150 (1 unit); 151-200 (3 unit); 201-250 (4 unit); 251-300 (5 unit); 251-300 (6 unit); 351-400 (8 unit)  . K Phos Mono-Sod Phos Di & Mono (PHOSPHA 250 NEUTRAL) 155-852-130 MG TABS   . labetalol (NORMODYNE) 200 MG tablet Take 200 mg by mouth 3 (three) times daily.   Marland Kitchen lisinopril (PRINIVIL,ZESTRIL) 20 MG tablet Take 20 mg by mouth 2 (two) times a day.   Marland Kitchen LOKELMA 10 g PACK packet Take 1 packet by mouth daily.  . magnesium oxide (MAG-OX) 400 MG tablet Take 1 tablet by mouth 2 (two) times daily.  . metFORMIN (GLUCOPHAGE) 1000 MG tablet Take 1,000 mg by mouth daily.  (Patient not taking: Reported on 09/20/2019)  . metFORMIN (GLUCOPHAGE-XR) 500 MG 24 hr tablet Take 500 mg by mouth 2 (two) times daily.  . Multiple Vitamin (MULTIVITAMIN WITH MINERALS) TABS tablet Take 1 tablet by mouth daily.  . Multiple Vitamin (MULTIVITAMIN) capsule Take by mouth. (Patient not taking: Reported on  09/20/2019)  . mycophenolate (MYFORTIC) 180 MG EC tablet Take by mouth.  Marland Kitchen NIFEdipine (PROCARDIA-XL/NIFEDICAL-XL) 30 MG 24 hr tablet Take 30 mg by mouth daily.  Marland Kitchen NOVOLOG FLEXPEN 100 UNIT/ML FlexPen   . oxyCODONE (OXY IR/ROXICODONE) 5 MG immediate release tablet Take 5 mg by mouth every 4 (four) hours.  . pantoprazole (PROTONIX) 40 MG tablet Take by mouth.  . predniSONE (DELTASONE) 5 MG tablet Take by mouth.  . sodium bicarbonate 650 MG tablet Take by mouth.  . sulfamethoxazole-trimethoprim (BACTRIM) 400-80 MG tablet   . Tacrolimus ER 1  MG TB24 Take by mouth.  . valGANciclovir (VALCYTE) 450 MG tablet Take by mouth.   Current Facility-Administered Medications (Other)  Medication Route  . Bevacizumab (AVASTIN) SOLN 1.25 mg Intravitreal  . Bevacizumab (AVASTIN) SOLN 1.25 mg Intravitreal  . Bevacizumab (AVASTIN) SOLN 1.25 mg Intravitreal  . Bevacizumab (AVASTIN) SOLN 1.25 mg Intravitreal  . Bevacizumab (AVASTIN) SOLN 1.25 mg Intravitreal  . Bevacizumab (AVASTIN) SOLN 1.25 mg Intravitreal  . Bevacizumab (AVASTIN) SOLN 1.25 mg Intravitreal      REVIEW OF SYSTEMS:    ALLERGIES No Known Allergies  PAST MEDICAL HISTORY Past Medical History:  Diagnosis Date  . Cataract    OU  . Chronic kidney disease 05/04/2018  . Diabetes 1.5, managed as type 2 ([place])   . Diabetic retinopathy ([place])    NPDR OU  . Hypertension   . Hypertensive retinopathy    OU   Past Surgical History:  Procedure Laterality Date  . AV FISTULA PLACEMENT Left 09/28/2018   Procedure: Creation of Left arm Radiocephalic ARTERIOVENOUS (AV) FISTULA;  Surgeon: Marty Heck, MD;  Location: [facility];  Service: Vascular;  Laterality: Left;  Marland Kitchen MULTIPLE TOOTH EXTRACTIONS      FAMILY HISTORY Family History  Problem Relation Age of Onset  . Heart failure Mother     SOCIAL HISTORY Social History   Tobacco Use  . Smoking status: Former Research scientist (life sciences)  . Smokeless tobacco: Never Used  . Tobacco comment: quit 20 years ago  Vaping Use  . Vaping Use: Never used  Substance Use Topics  . Alcohol use: Not Currently  . Drug use: Never         OPHTHALMIC EXAM:  Not recorded     IMAGING AND PROCEDURES  Imaging and Procedures for _0 @           ASSESSMENT/PLAN:    ICD-10-CM   1. Severe nonproliferative diabetic retinopathy of both eyes with macular edema associated with type 2 diabetes mellitus ([place])  W40.9735   2. Retinal edema  H35.81   3. Essential hypertension  I10   4. Hypertensive retinopathy of both eyes  H35.033   5. Combined  forms of age-related cataract of both eyes  H25.813     1, 2. Severe Non-proliferative diabetic retinopathy, both eyes  - initial exam showed massive central DME OU and scattered IRH and IRMA  - FA 6.5.19 with patches of capillary nonperfusion; extensive Mas with late leakage OU; no frank NV  - FA 01.02.20, shows improvement in late leaking microaneurysms OU  - S/P IVA OS #1 (06.05.19), #2 (07.08.19), #3 (07.08.19), #4 (09.03.19)  - S/P IVA OD #1 (06.07.19), #2 (07.08.19), #3 (07.08.19)  - S/P IVE OD #1 (09.03.19), #2 (10.03.19), #3 (11.04.19), #4 (12.02.19), #5 (01.02.20), #6 (02.07.20), #7 (03.16.20), #8 (05.20.20), #9 (06.22.20), #10 (07.27.20), #11(09.09.10), # 12 (10.28.20), #13 (11.25.20), #14 (04.06.21) - sample, #15 (05.04.21) - sample, #16 (06.18.21), #17 (07.30.21), #18 (09.10.21), #19 (11.16.21), #20 (  01.07.22)  - S/P IVE OS #1 (10.03.19), #2 (11.04.19), #3 (12.02.19), #4 (01.02.20), #5 (02.07.20), #6 (03.16.20), #7 (05.20.20), #8 (06.22.20),#9 (07.27.20), #10 (09.09.20), #11 (10.28.20), #12 (11.25.20)  - OCT shows OD shows persistent IRF, +ERM at 8 wk interval; OS: mild interval improvement in IRF ST macula -- non central  - BCVA: OD 20/30-2 (stable),  OS 20/20-1 (slightly improved from 20/25-1)  - recommend IVE OD #21 today, 02.25.22 with f/u in 6-8 wks (stable edema)  - will hold off on injection OS again today -- IRF improving without therapy and BCVA 20/20-1  - pt in agreement  - RBA of procedure discussed, questions answered  - informed consent obtained and signed  - see procedure note -- tolerated well  - [facility] paperwork and benefits investigation started on 08.05.19 -- approved for 2021   - f/u in 6-8 weeks -- DFE/OCT/possible IVE -- decrease interval if edema or vision worsens  3,4. Hypertensive retinopathy OU  - discussed importance of tight BP control  - had some Bps in hypertensive emergency range (200s / 110s)  - BP improved post kidney transplant  - discussed likely  contribution to DME  - monitor             - BP management per nephrology, PCP and cardiology  5. Combined form age-related cataract OU-   - The symptoms of cataract, surgical options, and treatments and risks were discussed with patient.  - discussed diagnosis and progression  - not yet visually significant  - monitor for now  Ophthalmic Meds Ordered this visit:  No orders of the defined types were placed in this encounter.      No follow-ups on file.  There are no Patient Instructions on file for this visit.   Explained the diagnoses, plan, and follow up with the patient and they expressed understanding.  Patient expressed understanding of the importance of proper follow up care.   This document serves as a record of services personally performed by Gardiner Sleeper, MD, PhD. It was created on their behalf by San Jetty. Owens Shark, OA an ophthalmic technician. The creation of this record is the provider's dictation and/or activities during the visit.    Electronically signed by: San Jetty. [place], [place] 02.21.2022 1:22 PM  Gardiner Sleeper, M.D., Ph.D. Diseases & Surgery of the Retina and Vitreous [facility]: M myopia (nearsighted); A astigmatism; H hyperopia (farsighted); P presbyopia; Mrx spectacle prescription;  CTL contact lenses; OD right eye; OS left eye; OU both eyes  XT exotropia; ET esotropia; PEK punctate epithelial keratitis; PEE punctate epithelial erosions; DES dry eye syndrome; MGD meibomian gland dysfunction; ATs artificial tears; PFAT's preservative free artificial tears; [place] nuclear sclerotic cataract; PSC posterior subcapsular cataract; ERM epi-retinal membrane; PVD posterior vitreous detachment; RD retinal detachment; DM diabetes mellitus; DR diabetic retinopathy; NPDR non-proliferative diabetic retinopathy; PDR proliferative diabetic retinopathy; CSME clinically significant macular edema; DME diabetic macular edema; dbh dot blot  hemorrhages; CWS cotton wool spot; POAG primary open angle glaucoma; C/D cup-to-disc ratio; HVF humphrey visual field; GVF goldmann visual field; OCT optical coherence tomography; IOP intraocular pressure; BRVO Branch retinal vein occlusion; CRVO central retinal vein occlusion; CRAO central retinal artery occlusion; BRAO branch retinal artery occlusion; RT retinal tear; SB scleral buckle; PPV pars plana vitrectomy; VH Vitreous hemorrhage; PRP panretinal laser photocoagulation; IVK intravitreal kenalog; VMT vitreomacular traction; MH Macular hole;  NVD neovascularization of the disc; NVE neovascularization elsewhere; AREDS age related eye disease  study; ARMD age related macular degeneration; POAG primary open angle glaucoma; EBMD epithelial/anterior basement membrane dystrophy; ACIOL anterior chamber intraocular lens; IOL intraocular lens; PCIOL posterior chamber intraocular lens; Phaco/IOL phacoemulsification with intraocular lens placement; [place] photorefractive keratectomy; LASIK laser assisted in situ keratomileusis; HTN hypertension; DM diabetes mellitus; COPD chronic obstructive pulmonary disease

## 2020-05-29 ENCOUNTER — Encounter (INDEPENDENT_AMBULATORY_CARE_PROVIDER_SITE_OTHER): Payer: Medicare Other | Admitting: Ophthalmology

## 2020-05-29 DIAGNOSIS — H35033 Hypertensive retinopathy, bilateral: Secondary | ICD-10-CM

## 2020-05-29 DIAGNOSIS — I10 Essential (primary) hypertension: Secondary | ICD-10-CM

## 2020-05-29 DIAGNOSIS — H3581 Retinal edema: Secondary | ICD-10-CM

## 2020-05-29 DIAGNOSIS — H25813 Combined forms of age-related cataract, bilateral: Secondary | ICD-10-CM

## 2020-05-29 DIAGNOSIS — E113413 Type 2 diabetes mellitus with severe nonproliferative diabetic retinopathy with macular edema, bilateral: Secondary | ICD-10-CM

## 2020-06-05 NOTE — Progress Notes (Incomplete)
[facility] Note  06/12/2020     CHIEF COMPLAINT Patient presents for No chief complaint on file.   HISTORY OF PRESENT ILLNESS: Philip Wilson is a 50 y.o. male who presents to the clinic today for:    Patient states vision the same OU.   Referring physician: Lillard Anes, MD [address] [place],  [place] [address]  HISTORICAL INFORMATION:   Selected notes from the MEDICAL RECORD NUMBER Referred by Dr. Ricki Ruhlman for diabetic retinopathy OU   CURRENT MEDICATIONS: No current outpatient medications on file. (Ophthalmic Drugs)   Current Facility-Administered Medications (Ophthalmic Drugs)  Medication Route  . aflibercept (EYLEA) SOLN 2 mg Intravitreal  . aflibercept (EYLEA) SOLN 2 mg Intravitreal  . aflibercept (EYLEA) SOLN 2 mg Intravitreal  . aflibercept (EYLEA) SOLN 2 mg Intravitreal  . aflibercept (EYLEA) SOLN 2 mg Intravitreal  . aflibercept (EYLEA) SOLN 2 mg Intravitreal  . aflibercept (EYLEA) SOLN 2 mg Intravitreal  . aflibercept (EYLEA) SOLN 2 mg Intravitreal  . aflibercept (EYLEA) SOLN 2 mg Intravitreal  . aflibercept (EYLEA) SOLN 2 mg Intravitreal  . aflibercept (EYLEA) SOLN 2 mg Intravitreal   Current Outpatient Medications (Other)  Medication Sig  . ACCU-CHEK GUIDE test strip every morning. Use to test fasting blood glucose  . AgaMatrix Ultra-Thin Lancets MISC 200 each by Misc.(Non-Drug; Combo Route) route 4 times daily. One touch delica  . aspirin 81 MG EC tablet Take by mouth.  Marland Kitchen atorvastatin (LIPITOR) 40 MG tablet TAKE 1 TABLET BY MOUTH DAILY  . B-D UF III MINI PEN NEEDLES 31G X 5 MM MISC   . Blood Glucose Monitoring Suppl (FIFTY50 GLUCOSE METER 2.0) w/Device KIT 1 each by Other route 4 times daily. Use as instructed one touch ultra  . cloNIDine (CATAPRES) 0.1 MG tablet Take 0.1 mg by mouth 2 (two) times daily.   . dapsone 25 MG tablet Take 50 mg by mouth daily.  . fluconazole (DIFLUCAN) 50 MG tablet    . furosemide (LASIX) 40 MG tablet Take 40 mg by mouth daily.  Marland Kitchen glimepiride (AMARYL) 4 MG tablet Take 4 mg by mouth daily with breakfast.   . HYDROcodone-acetaminophen (NORCO/VICODIN) 5-325 MG tablet Take 1 tablet by mouth every 4 (four) hours as needed for moderate pain.  . Insulin Glargine (BASAGLAR KWIKPEN) 100 UNIT/ML Inject into the skin.  Marland Kitchen insulin lispro (HUMALOG) 100 UNIT/ML injection Inject 5 units with breakfast and dinner and 4 units with lunch plus sliding scale as follows: BG 100-150 (1 unit); 151-200 (3 unit); 201-250 (4 unit); 251-300 (5 unit); 251-300 (6 unit); 351-400 (8 unit)  . K Phos Mono-Sod Phos Di & Mono (PHOSPHA 250 NEUTRAL) 155-852-130 MG TABS   . labetalol (NORMODYNE) 200 MG tablet Take 200 mg by mouth 3 (three) times daily.   Marland Kitchen lisinopril (PRINIVIL,ZESTRIL) 20 MG tablet Take 20 mg by mouth 2 (two) times a day.   Marland Kitchen LOKELMA 10 g PACK packet Take 1 packet by mouth daily.  . magnesium oxide (MAG-OX) 400 MG tablet Take 1 tablet by mouth 2 (two) times daily.  . metFORMIN (GLUCOPHAGE) 1000 MG tablet Take 1,000 mg by mouth daily.  (Patient not taking: Reported on 09/20/2019)  . metFORMIN (GLUCOPHAGE-XR) 500 MG 24 hr tablet Take 500 mg by mouth 2 (two) times daily.  . Multiple Vitamin (MULTIVITAMIN WITH MINERALS) TABS tablet Take 1 tablet by mouth daily.  . Multiple Vitamin (MULTIVITAMIN) capsule Take by mouth. (Patient not taking: Reported on  09/20/2019)  . mycophenolate (MYFORTIC) 180 MG EC tablet Take by mouth.  Marland Kitchen NIFEdipine (PROCARDIA-XL/NIFEDICAL-XL) 30 MG 24 hr tablet Take 30 mg by mouth daily.  Marland Kitchen NOVOLOG FLEXPEN 100 UNIT/ML FlexPen   . oxyCODONE (OXY IR/ROXICODONE) 5 MG immediate release tablet Take 5 mg by mouth every 4 (four) hours.  . pantoprazole (PROTONIX) 40 MG tablet Take by mouth.  . predniSONE (DELTASONE) 5 MG tablet Take by mouth.  . sodium bicarbonate 650 MG tablet Take by mouth.  . sulfamethoxazole-trimethoprim (BACTRIM) 400-80 MG tablet   . Tacrolimus ER 1  MG TB24 Take by mouth.  . valGANciclovir (VALCYTE) 450 MG tablet Take by mouth.   Current Facility-Administered Medications (Other)  Medication Route  . Bevacizumab (AVASTIN) SOLN 1.25 mg Intravitreal  . Bevacizumab (AVASTIN) SOLN 1.25 mg Intravitreal  . Bevacizumab (AVASTIN) SOLN 1.25 mg Intravitreal  . Bevacizumab (AVASTIN) SOLN 1.25 mg Intravitreal  . Bevacizumab (AVASTIN) SOLN 1.25 mg Intravitreal  . Bevacizumab (AVASTIN) SOLN 1.25 mg Intravitreal  . Bevacizumab (AVASTIN) SOLN 1.25 mg Intravitreal      REVIEW OF SYSTEMS:    ALLERGIES No Known Allergies  PAST MEDICAL HISTORY Past Medical History:  Diagnosis Date  . Cataract    OU  . Chronic kidney disease 05/04/2018  . Diabetes 1.5, managed as type 2 ([place])   . Diabetic retinopathy ([facility])    NPDR OU  . Hypertension   . Hypertensive retinopathy    OU   Past Surgical History:  Procedure Laterality Date  . AV FISTULA PLACEMENT Left 09/28/2018   Procedure: Creation of Left arm Radiocephalic ARTERIOVENOUS (AV) FISTULA;  Surgeon: Marty Heck, MD;  Location: [facility];  Service: Vascular;  Laterality: Left;  Marland Kitchen MULTIPLE TOOTH EXTRACTIONS      FAMILY HISTORY Family History  Problem Relation Age of Onset  . Heart failure Mother     SOCIAL HISTORY Social History   Tobacco Use  . Smoking status: Former Research scientist (life sciences)  . Smokeless tobacco: Never Used  . Tobacco comment: quit 20 years ago  Vaping Use  . Vaping Use: Never used  Substance Use Topics  . Alcohol use: Not Currently  . Drug use: Never         OPHTHALMIC EXAM:  Not recorded     IMAGING AND PROCEDURES  Imaging and Procedures for _0 @           ASSESSMENT/PLAN:    ICD-10-CM   1. Severe nonproliferative diabetic retinopathy of both eyes with macular edema associated with type 2 diabetes mellitus ([place])  W40.9735   2. Retinal edema  H35.81   3. Essential hypertension  I10   4. Hypertensive retinopathy of both eyes  H35.033   5. Combined  forms of age-related cataract of both eyes  H25.813     1, 2. Severe Non-proliferative diabetic retinopathy, both eyes  - initial exam showed massive central DME OU and scattered IRH and IRMA  - FA 6.5.19 with patches of capillary nonperfusion; extensive Mas with late leakage OU; no frank NV  - FA 01.02.20, shows improvement in late leaking microaneurysms OU  - S/P IVA OS #1 (06.05.19), #2 (07.08.19), #3 (07.08.19), #4 (09.03.19)  - S/P IVA OD #1 (06.07.19), #2 (07.08.19), #3 (07.08.19)  - S/P IVE OD #1 (09.03.19), #2 (10.03.19), #3 (11.04.19), #4 (12.02.19), #5 (01.02.20), #6 (02.07.20), #7 (03.16.20), #8 (05.20.20), #9 (06.22.20), #10 (07.27.20), #11(09.09.10), # 12 (10.28.20), #13 (11.25.20), #14 (04.06.21) - sample, #15 (05.04.21) - sample, #16 (06.18.21), #17 (07.30.21), #18 (09.10.21), #19 (11.16.21), #20 (  01.07.22)  - S/P IVE OS #1 (10.03.19), #2 (11.04.19), #3 (12.02.19), #4 (01.02.20), #5 (02.07.20), #6 (03.16.20), #7 (05.20.20), #8 (06.22.20),#9 (07.27.20), #10 (09.09.20), #11 (10.28.20), #12 (11.25.20)  - OCT shows OD shows persistent IRF, +ERM at 8 wk interval; OS: mild interval improvement in IRF ST macula -- non central  - BCVA: OD 20/30-2 (stable),  OS 20/20-1 (slightly improved from 20/25-1)  - recommend IVE OD #21 today, 03.11.22 with f/u in 6-8 wks (stable edema)  - will hold off on injection OS again today -- IRF improving without therapy and BCVA 20/20-1  - pt in agreement  - RBA of procedure discussed, questions answered  - informed consent obtained and signed  - see procedure note -- tolerated well  - [facility] paperwork and benefits investigation started on 08.05.19 -- approved for 2021   - f/u in 6-8 weeks -- DFE/OCT/possible IVE -- decrease interval if edema or vision worsens  3,4. Hypertensive retinopathy OU  - discussed importance of tight BP control  - had some Bps in hypertensive emergency range (200s / 110s)  - BP improved post kidney transplant  - discussed likely  contribution to DME  - monitor             - BP management per nephrology, PCP and cardiology  5. Combined form age-related cataract OU-   - The symptoms of cataract, surgical options, and treatments and risks were discussed with patient.  - discussed diagnosis and progression  - not yet visually significant  - monitor for now  Ophthalmic Meds Ordered this visit:  No orders of the defined types were placed in this encounter.      No follow-ups on file.  There are no Patient Instructions on file for this visit.   Explained the diagnoses, plan, and follow up with the patient and they expressed understanding.  Patient expressed understanding of the importance of proper follow up care.   This document serves as a record of services personally performed by Gardiner Sleeper, MD, PhD. It was created on their behalf by San Jetty. Owens Shark, OA an ophthalmic technician. The creation of this record is the provider's dictation and/or activities during the visit.    Electronically signed by: San Jetty. [place] Shark, [place] 03.04.2022 9:18 AM  Gardiner Sleeper, M.D., Ph.D. Diseases & Surgery of the Retina and Vitreous [facility]: M myopia (nearsighted); A astigmatism; H hyperopia (farsighted); P presbyopia; Mrx spectacle prescription;  CTL contact lenses; OD right eye; OS left eye; OU both eyes  XT exotropia; ET esotropia; PEK punctate epithelial keratitis; PEE punctate epithelial erosions; DES dry eye syndrome; MGD meibomian gland dysfunction; ATs artificial tears; PFAT's preservative free artificial tears; [place] nuclear sclerotic cataract; PSC posterior subcapsular cataract; ERM epi-retinal membrane; PVD posterior vitreous detachment; RD retinal detachment; DM diabetes mellitus; DR diabetic retinopathy; NPDR non-proliferative diabetic retinopathy; PDR proliferative diabetic retinopathy; CSME clinically significant macular edema; DME diabetic macular edema; dbh dot blot  hemorrhages; CWS cotton wool spot; POAG primary open angle glaucoma; C/D cup-to-disc ratio; HVF humphrey visual field; GVF goldmann visual field; OCT optical coherence tomography; IOP intraocular pressure; BRVO Branch retinal vein occlusion; CRVO central retinal vein occlusion; CRAO central retinal artery occlusion; BRAO branch retinal artery occlusion; RT retinal tear; SB scleral buckle; PPV pars plana vitrectomy; VH Vitreous hemorrhage; PRP panretinal laser photocoagulation; IVK intravitreal kenalog; VMT vitreomacular traction; MH Macular hole;  NVD neovascularization of the disc; NVE neovascularization elsewhere; AREDS age related eye disease  study; ARMD age related macular degeneration; POAG primary open angle glaucoma; EBMD epithelial/anterior basement membrane dystrophy; ACIOL anterior chamber intraocular lens; IOL intraocular lens; PCIOL posterior chamber intraocular lens; Phaco/IOL phacoemulsification with intraocular lens placement; [place] photorefractive keratectomy; LASIK laser assisted in situ keratomileusis; HTN hypertension; DM diabetes mellitus; COPD chronic obstructive pulmonary disease

## 2020-06-12 ENCOUNTER — Encounter (INDEPENDENT_AMBULATORY_CARE_PROVIDER_SITE_OTHER): Payer: Medicare Other | Admitting: Ophthalmology

## 2020-06-12 DIAGNOSIS — H3581 Retinal edema: Secondary | ICD-10-CM

## 2020-06-12 DIAGNOSIS — H35033 Hypertensive retinopathy, bilateral: Secondary | ICD-10-CM

## 2020-06-12 DIAGNOSIS — E113413 Type 2 diabetes mellitus with severe nonproliferative diabetic retinopathy with macular edema, bilateral: Secondary | ICD-10-CM

## 2020-06-12 DIAGNOSIS — I10 Essential (primary) hypertension: Secondary | ICD-10-CM

## 2020-06-12 DIAGNOSIS — H25813 Combined forms of age-related cataract, bilateral: Secondary | ICD-10-CM

## 2020-07-10 NOTE — Progress Notes (Signed)
[facility] Note  07/14/2020     CHIEF COMPLAINT Patient presents for Retina Follow Up   HISTORY OF PRESENT ILLNESS: Philip Wilson is a 50 y.o. male who presents to the clinic today for:   HPI    Retina Follow Up    Patient presents with  Diabetic Retinopathy.  In both eyes.  This started 13 weeks ago.  I, the attending physician,  performed the HPI with the patient and updated documentation appropriately.          Comments    Patient here for 13 weeks retina follow up for NPDR OU. Patient states vision about the same. No eye pain.        Last edited by Bernarda Caffey, MD on 07/14/2020  8:58 AM. (History)    Patient is delayed to follow up from 6-8 weeks to 13 weeks, states he was busy last month, he states vision is stable  Referring physician: Phylliss Blakes, OD [address],  [place] [address]  HISTORICAL INFORMATION:   Selected notes from the MEDICAL RECORD NUMBER Referred by Dr. Ricki Ostermann for diabetic retinopathy OU   CURRENT MEDICATIONS: No current outpatient medications on file. (Ophthalmic Drugs)   Current Facility-Administered Medications (Ophthalmic Drugs)  Medication Route  . aflibercept (EYLEA) SOLN 2 mg Intravitreal  . aflibercept (EYLEA) SOLN 2 mg Intravitreal  . aflibercept (EYLEA) SOLN 2 mg Intravitreal  . aflibercept (EYLEA) SOLN 2 mg Intravitreal  . aflibercept (EYLEA) SOLN 2 mg Intravitreal  . aflibercept (EYLEA) SOLN 2 mg Intravitreal  . aflibercept (EYLEA) SOLN 2 mg Intravitreal  . aflibercept (EYLEA) SOLN 2 mg Intravitreal  . aflibercept (EYLEA) SOLN 2 mg Intravitreal  . aflibercept (EYLEA) SOLN 2 mg Intravitreal  . aflibercept (EYLEA) SOLN 2 mg Intravitreal   Current Outpatient Medications (Other)  Medication Sig  . ACCU-CHEK GUIDE test strip every morning. Use to test fasting blood glucose  . AgaMatrix Ultra-Thin Lancets MISC 200 each by Misc.(Non-Drug; Combo Route) route 4 times daily. One touch  delica  . aspirin 81 MG EC tablet Take by mouth.  Marland Kitchen atorvastatin (LIPITOR) 40 MG tablet TAKE 1 TABLET BY MOUTH DAILY  . B-D UF III MINI PEN NEEDLES 31G X 5 MM MISC   . Blood Glucose Monitoring Suppl (FIFTY50 GLUCOSE METER 2.0) w/Device KIT 1 each by Other route 4 times daily. Use as instructed one touch ultra  . cloNIDine (CATAPRES) 0.1 MG tablet Take 0.1 mg by mouth 2 (two) times daily.   . dapsone 25 MG tablet Take 50 mg by mouth daily.  . fluconazole (DIFLUCAN) 50 MG tablet   . furosemide (LASIX) 40 MG tablet Take 40 mg by mouth daily.  Marland Kitchen glimepiride (AMARYL) 4 MG tablet Take 4 mg by mouth daily with breakfast.   . HYDROcodone-acetaminophen (NORCO/VICODIN) 5-325 MG tablet Take 1 tablet by mouth every 4 (four) hours as needed for moderate pain.  . Insulin Glargine (BASAGLAR KWIKPEN) 100 UNIT/ML Inject into the skin.  Marland Kitchen insulin lispro (HUMALOG) 100 UNIT/ML injection Inject 5 units with breakfast and dinner and 4 units with lunch plus sliding scale as follows: BG 100-150 (1 unit); 151-200 (3 unit); 201-250 (4 unit); 251-300 (5 unit); 251-300 (6 unit); 351-400 (8 unit)  . K Phos Mono-Sod Phos Di & Mono (PHOSPHA 250 NEUTRAL) 155-852-130 MG TABS   . labetalol (NORMODYNE) 200 MG tablet Take 200 mg by mouth 3 (three) times daily.   Marland Kitchen lisinopril (PRINIVIL,ZESTRIL) 20 MG  tablet Take 20 mg by mouth 2 (two) times a day.   Marland Kitchen LOKELMA 10 g PACK packet Take 1 packet by mouth daily.  . magnesium oxide (MAG-OX) 400 MG tablet Take 1 tablet by mouth 2 (two) times daily.  . metFORMIN (GLUCOPHAGE) 1000 MG tablet Take 1,000 mg by mouth daily.  (Patient not taking: Reported on 09/20/2019)  . metFORMIN (GLUCOPHAGE-XR) 500 MG 24 hr tablet Take 500 mg by mouth 2 (two) times daily.  . Multiple Vitamin (MULTIVITAMIN WITH MINERALS) TABS tablet Take 1 tablet by mouth daily.  . Multiple Vitamin (MULTIVITAMIN) capsule Take by mouth. (Patient not taking: Reported on 09/20/2019)  . mycophenolate (MYFORTIC) 180 MG EC tablet Take  by mouth.  Marland Kitchen NIFEdipine (PROCARDIA-XL/NIFEDICAL-XL) 30 MG 24 hr tablet Take 30 mg by mouth daily.  Marland Kitchen NOVOLOG FLEXPEN 100 UNIT/ML FlexPen   . oxyCODONE (OXY IR/ROXICODONE) 5 MG immediate release tablet Take 5 mg by mouth every 4 (four) hours.  . pantoprazole (PROTONIX) 40 MG tablet Take by mouth.  . predniSONE (DELTASONE) 5 MG tablet Take by mouth.  . sodium bicarbonate 650 MG tablet Take by mouth.  . sulfamethoxazole-trimethoprim (BACTRIM) 400-80 MG tablet   . Tacrolimus ER 1 MG TB24 Take by mouth.  . valGANciclovir (VALCYTE) 450 MG tablet Take by mouth.   Current Facility-Administered Medications (Other)  Medication Route  . Bevacizumab (AVASTIN) SOLN 1.25 mg Intravitreal  . Bevacizumab (AVASTIN) SOLN 1.25 mg Intravitreal  . Bevacizumab (AVASTIN) SOLN 1.25 mg Intravitreal  . Bevacizumab (AVASTIN) SOLN 1.25 mg Intravitreal  . Bevacizumab (AVASTIN) SOLN 1.25 mg Intravitreal  . Bevacizumab (AVASTIN) SOLN 1.25 mg Intravitreal  . Bevacizumab (AVASTIN) SOLN 1.25 mg Intravitreal      REVIEW OF SYSTEMS: ROS    Positive for: Neurological, Genitourinary, Endocrine, Eyes   Negative for: Constitutional, Gastrointestinal, Skin, Musculoskeletal, HENT, Cardiovascular, Respiratory, Psychiatric, Allergic/Imm, Heme/Lymph   Last edited by Theodore Demark, COA on 07/14/2020  8:19 AM. (History)       ALLERGIES No Known Allergies  PAST MEDICAL HISTORY Past Medical History:  Diagnosis Date  . Cataract    OU  . Chronic kidney disease 05/04/2018  . Diabetes 1.5, managed as type 2 ([place])   . Diabetic retinopathy ([place])    NPDR OU  . Hypertension   . Hypertensive retinopathy    OU   Past Surgical History:  Procedure Laterality Date  . AV FISTULA PLACEMENT Left 09/28/2018   Procedure: Creation of Left arm Radiocephalic ARTERIOVENOUS (AV) FISTULA;  Surgeon: Marty Heck, MD;  Location: [facility];  Service: Vascular;  Laterality: Left;  Marland Kitchen MULTIPLE TOOTH EXTRACTIONS      FAMILY  HISTORY Family History  Problem Relation Age of Onset  . Heart failure Mother     SOCIAL HISTORY Social History   Tobacco Use  . Smoking status: Former Research scientist (life sciences)  . Smokeless tobacco: Never Used  . Tobacco comment: quit 20 years ago  Vaping Use  . Vaping Use: Never used  Substance Use Topics  . Alcohol use: Not Currently  . Drug use: Never         OPHTHALMIC EXAM:  Base Eye Exam    Visual Acuity (Snellen - Linear)      Right Left   Dist [place] 20/30 20/30 -1   Dist ph [place] 20/25 20/20       Tonometry (Tonopen, 8:16 AM)      Right Left   Pressure 18 17       Pupils  Dark Light Shape React APD   Right 3 2 Round Brisk None   Left 3 2 Round Brisk None       Visual Fields (Counting fingers)      Left Right    Full Full       Extraocular Movement      Right Left    Full Full       Neuro/Psych    Oriented x3: Yes   Mood/Affect: Normal       Dilation    Both eyes: 1.0% Mydriacyl, 2.5% Phenylephrine @ 8:16 AM        Slit Lamp and Fundus Exam    Slit Lamp Exam      Right Left   Lids/Lashes Dermatochalasis - upper lid, mild Meibomian gland dysfunction Dermatochalasis - upper lid, mild Meibomian gland dysfunction   Conjunctiva/Sclera White and quiet White and quiet   Cornea trace PEE trace PEE   Anterior Chamber Deep and quiet, no cell or flare Deep and quiet, no cell or flare   Iris Round and dilated to 49m Round and dilated to 778m  Lens 1+ Nuclear sclerosis, 2+ Cortical cataract 1+ Nuclear sclerosis, 2+ Cortical cataract   Vitreous Mild Vitreous syneresis, Posterior vitreous detachment, Weiss ring Mild Vitreous syneresis, old white VH inferiorly       Fundus Exam      Right Left   Disc Pink and Sharp, mild pallor Pink and Sharp   C/D Ratio 0.2 0.3   Macula blunted foveal reflex, +ERM greatest superior to fovea, persistent cystic changes -- slightly increased Flat, good foveal reflex, scattered MA, trace cystic changes temporal macula - improved    Vessels attenuated, mild tortuousity, mild Copper wiring mild attenuation, mild tortuousity   Periphery Attached, scattered MAs and DBH - mostly posterior, No RT/RD Attached, rare MA          IMAGING AND PROCEDURES  Imaging and Procedures for _0 @  OCT, Retina - OU - Both Eyes       Right Eye Quality was good. Central Foveal Thickness: 396. Progression has worsened. Findings include intraretinal fluid, retinal drusen , intraretinal hyper-reflective material, no SRF, abnormal foveal contour, epiretinal membrane, macular pucker (Mild interval increase IRF; +ERM).   Left Eye Quality was good. Central Foveal Thickness: 256. Progression has been stable. Findings include intraretinal fluid, epiretinal membrane, no SRF, intraretinal hyper-reflective material, normal foveal contour (Stable improvement in IRF temporal macula).   Notes *Images captured and stored on drive  Diagnosis / Impression:  DME OU OD: Mild interval increase IRF; +ERM OS: Stable improvement in IRF temporal macula  Clinical management:  See below  Abbreviations: NFP - Normal foveal profile. CME - cystoid macular edema. PED - pigment epithelial detachment. IRF - intraretinal fluid. SRF - subretinal fluid. EZ - ellipsoid zone. ERM - epiretinal membrane. ORA - outer retinal atrophy. ORT - outer retinal tubulation. SRHM - subretinal hyper-reflective material        Intravitreal Injection, Pharmacologic Agent - OD - Right Eye       Time Out 07/14/2020. 8:43 AM. Confirmed correct patient, procedure, site, and patient consented.   Anesthesia Topical anesthesia was used. Anesthetic medications included Lidocaine 2%, Proparacaine 0.5%.   Procedure Preparation included 5% betadine to ocular surface, eyelid speculum. A supplied (32g) needle was used.   Injection:  2 mg aflibercept (EAlfonse FlavorsSOLN   NDC: 6142353-614-43Lot: [id number] date: 03/03/2021   Route: Intravitreal, Site: Right Eye, Waste: 0.05  mL  Post-op Post  injection exam found visual acuity of at least counting fingers. The patient tolerated the procedure well. There were no complications. The patient received written and verbal post procedure care education.                 ASSESSMENT/PLAN:    ICD-10-CM   1. Severe nonproliferative diabetic retinopathy of both eyes with macular edema associated with type 2 diabetes mellitus (HCC)  V76.1607 Intravitreal Injection, Pharmacologic Agent - OD - Right Eye    aflibercept (EYLEA) SOLN 2 mg  2. Retinal edema  H35.81 OCT, Retina - OU - Both Eyes  3. Essential hypertension  I10   4. Hypertensive retinopathy of both eyes  H35.033   5. Combined forms of age-related cataract of both eyes  H25.813     1, 2. Severe Non-proliferative diabetic retinopathy, both eyes  - **delayed f/u today from 6-8 weeks to 13 weeks**  - initial exam showed massive central DME OU and scattered IRH and IRMA  - FA 6.5.19 with patches of capillary nonperfusion; extensive Mas with late leakage OU; no frank NV  - FA 01.02.20, shows improvement in late leaking microaneurysms OU  - S/P IVA OS #1 (06.05.19), #2 (07.08.19), #3 (07.08.19), #4 (09.03.19)  - S/P IVA OD #1 (06.07.19), #2 (07.08.19), #3 (07.08.19)  - S/P IVE OD #1 (09.03.19), #2 (10.03.19), #3 (11.04.19), #4 (12.02.19), #5 (01.02.20), #6 (02.07.20), #7 (03.16.20), #8 (05.20.20), #9 (06.22.20), #10 (07.27.20), #11(09.09.10), # 12 (10.28.20), #13 (11.25.20), #14 (04.06.21) - sample, #15 (05.04.21) - sample, #16 (06.18.21), #17 (07.30.21), #18 (09.10.21), #19 (11.16.21), #20 (1.7.22)  - S/P IVE OS #1 (10.03.19), #2 (11.04.19), #3 (12.02.19), #4 (01.02.20), #5 (02.07.20), #6 (03.16.20), #7 (05.20.20), #8 (06.22.20),#9 (07.27.20), #10 (09.09.20), #11 (10.28.20), #12 (11.25.20)  - OCT shows OD: Mild interval increase IRF; +ERM; OS: Stable improvement in IRF temporal macula  - BCVA: OD 20/25 (improved),  OS 20/20-1 (stable)  - recommend IVE OD #21 today,  4.12.22 with f/u in 6-8 wks (stable edema)  - will hold off on injection OS again today -- IRF improving without therapy and BCVA 20/20  - pt in agreement  - RBA of procedure discussed, questions answered  - informed consent obtained and signed  - see procedure note -- tolerated well  - Eylea paperwork and benefits investigation started on 08.05.19 -- approved for 2021   - f/u in 6-8 weeks -- DFE/OCT/possible IVE -- decrease interval if edema or vision worsens  3,4. Hypertensive retinopathy OU  - discussed importance of tight BP control  - had some Bps in hypertensive emergency range (200s / 110s)  - BP improved post kidney transplant  - discussed likely contribution to DME  - monitor             - BP management per nephrology, PCP and cardiology  5. Combined form age-related cataract OU-   - The symptoms of cataract, surgical options, and treatments and risks were discussed with patient.  - discussed diagnosis and progression  - not yet visually significant  - monitor for now  Ophthalmic Meds Ordered this visit:  Meds ordered this encounter  Medications  . aflibercept (EYLEA) SOLN 2 mg       Return for f/u 6-8 weeks, NPDR OU, DFE, OCT.  There are no Patient Instructions on file for this visit.   Explained the diagnoses, plan, and follow up with the patient and they expressed understanding.  Patient expressed understanding of the importance of proper follow up care.   This  document serves as a record of services personally performed by Gardiner Sleeper, MD, PhD. It was created on their behalf by Estill Bakes, COT an ophthalmic technician. The creation of this record is the provider's dictation and/or activities during the visit.    Electronically signed by: Estill Bakes, COT 4.8.22 @ 9:01 AM   This document serves as a record of services personally performed by Gardiner Sleeper, MD, PhD. It was created on their behalf by San Jetty. Owens Shark, OA an ophthalmic technician. The  creation of this record is the provider's dictation and/or activities during the visit.    Electronically signed by: San Jetty. Owens Shark, [place] 04.12.2022 9:01 AM  Gardiner Sleeper, M.D., Ph.D. Diseases & Surgery of the Retina and [place] 07/14/2020   I have reviewed the above documentation for accuracy and completeness, and I agree with the above. Gardiner Sleeper, M.D., Ph.D. 07/14/20 9:01 AM  Abbreviations: M myopia (nearsighted); A astigmatism; H hyperopia (farsighted); P presbyopia; Mrx spectacle prescription;  CTL contact lenses; OD right eye; OS left eye; OU both eyes  XT exotropia; ET esotropia; PEK punctate epithelial keratitis; PEE punctate epithelial erosions; DES dry eye syndrome; MGD meibomian gland dysfunction; ATs artificial tears; PFAT's preservative free artificial tears; [place] nuclear sclerotic cataract; PSC posterior subcapsular cataract; ERM epi-retinal membrane; PVD posterior vitreous detachment; RD retinal detachment; DM diabetes mellitus; DR diabetic retinopathy; NPDR non-proliferative diabetic retinopathy; PDR proliferative diabetic retinopathy; CSME clinically significant macular edema; DME diabetic macular edema; dbh dot blot hemorrhages; CWS cotton wool spot; POAG primary open angle glaucoma; C/D cup-to-disc ratio; HVF humphrey visual field; GVF goldmann visual field; OCT optical coherence tomography; IOP intraocular pressure; BRVO Branch retinal vein occlusion; CRVO central retinal vein occlusion; CRAO central retinal artery occlusion; BRAO branch retinal artery occlusion; RT retinal tear; SB scleral buckle; PPV pars plana vitrectomy; VH Vitreous hemorrhage; PRP panretinal laser photocoagulation; IVK intravitreal kenalog; VMT vitreomacular traction; MH Macular hole;  NVD neovascularization of the disc; NVE neovascularization elsewhere; AREDS age related eye disease study; ARMD age related macular degeneration; POAG primary open angle glaucoma; EBMD  epithelial/anterior basement membrane dystrophy; ACIOL anterior chamber intraocular lens; IOL intraocular lens; PCIOL posterior chamber intraocular lens; Phaco/IOL phacoemulsification with intraocular lens placement; [place] photorefractive keratectomy; LASIK laser assisted in situ keratomileusis; HTN hypertension; DM diabetes mellitus; COPD chronic obstructive pulmonary disease

## 2020-07-14 ENCOUNTER — Other Ambulatory Visit: Payer: Self-pay

## 2020-07-14 ENCOUNTER — Encounter (INDEPENDENT_AMBULATORY_CARE_PROVIDER_SITE_OTHER): Payer: Self-pay | Admitting: Ophthalmology

## 2020-07-14 ENCOUNTER — Ambulatory Visit (INDEPENDENT_AMBULATORY_CARE_PROVIDER_SITE_OTHER): Payer: BC Managed Care – PPO | Admitting: Ophthalmology

## 2020-07-14 DIAGNOSIS — H25813 Combined forms of age-related cataract, bilateral: Secondary | ICD-10-CM

## 2020-07-14 DIAGNOSIS — H3581 Retinal edema: Secondary | ICD-10-CM | POA: Diagnosis not present

## 2020-07-14 DIAGNOSIS — E113413 Type 2 diabetes mellitus with severe nonproliferative diabetic retinopathy with macular edema, bilateral: Secondary | ICD-10-CM

## 2020-07-14 DIAGNOSIS — H35033 Hypertensive retinopathy, bilateral: Secondary | ICD-10-CM | POA: Diagnosis not present

## 2020-07-14 DIAGNOSIS — I10 Essential (primary) hypertension: Secondary | ICD-10-CM | POA: Diagnosis not present

## 2020-07-14 MED ORDER — AFLIBERCEPT 2MG/0.05ML IZ SOLN FOR KALEIDOSCOPE
2.0000 mg | INTRAVITREAL | Status: AC | PRN
  Administered 2020-07-14: 2 mg via INTRAVITREAL

## 2020-09-08 ENCOUNTER — Ambulatory Visit (INDEPENDENT_AMBULATORY_CARE_PROVIDER_SITE_OTHER): Payer: Medicare Other | Admitting: Ophthalmology

## 2020-09-08 ENCOUNTER — Encounter (INDEPENDENT_AMBULATORY_CARE_PROVIDER_SITE_OTHER): Payer: Self-pay | Admitting: Ophthalmology

## 2020-09-08 ENCOUNTER — Encounter (INDEPENDENT_AMBULATORY_CARE_PROVIDER_SITE_OTHER): Payer: Medicare Other | Admitting: Ophthalmology

## 2020-09-08 ENCOUNTER — Other Ambulatory Visit: Payer: Self-pay

## 2020-09-08 DIAGNOSIS — H3581 Retinal edema: Secondary | ICD-10-CM | POA: Diagnosis not present

## 2020-09-08 DIAGNOSIS — H35033 Hypertensive retinopathy, bilateral: Secondary | ICD-10-CM

## 2020-09-08 DIAGNOSIS — H25813 Combined forms of age-related cataract, bilateral: Secondary | ICD-10-CM

## 2020-09-08 DIAGNOSIS — E113413 Type 2 diabetes mellitus with severe nonproliferative diabetic retinopathy with macular edema, bilateral: Secondary | ICD-10-CM

## 2020-09-08 DIAGNOSIS — I10 Essential (primary) hypertension: Secondary | ICD-10-CM | POA: Diagnosis not present

## 2020-09-08 MED ORDER — AFLIBERCEPT 2MG/0.05ML IZ SOLN FOR KALEIDOSCOPE
2.0000 mg | INTRAVITREAL | Status: AC | PRN
  Administered 2020-09-08: 2 mg via INTRAVITREAL

## 2020-09-08 NOTE — Progress Notes (Addendum)
[facility] Note  09/08/2020     CHIEF COMPLAINT Patient presents for Retina Follow Up   HISTORY OF PRESENT ILLNESS: Philip Wilson is a 50 y.o. male who presents to the clinic today for:   HPI    Retina Follow Up    Patient presents with  Diabetic Retinopathy.  In both eyes.  This started 7 weeks ago.  I, the attending physician,  performed the HPI with the patient and updated documentation appropriately.          Comments    Patient here for 7 weeks retina follow up for NPDR OU. Patient states vision hard to tell. May be about the same. No eye pain.        Last edited by Bernarda Caffey, MD on 09/08/2020  9:45 PM. (History)    pt states no change in vision   Referring physician: Lillard Anes, MD [address] [place],  [place] [address]  HISTORICAL INFORMATION:   Selected notes from the MEDICAL RECORD NUMBER Referred by Dr. Ricki Oliger for diabetic retinopathy OU   CURRENT MEDICATIONS: No current outpatient medications on file. (Ophthalmic Drugs)   Current Facility-Administered Medications (Ophthalmic Drugs)  Medication Route  . aflibercept (EYLEA) SOLN 2 mg Intravitreal  . aflibercept (EYLEA) SOLN 2 mg Intravitreal  . aflibercept (EYLEA) SOLN 2 mg Intravitreal  . aflibercept (EYLEA) SOLN 2 mg Intravitreal  . aflibercept (EYLEA) SOLN 2 mg Intravitreal  . aflibercept (EYLEA) SOLN 2 mg Intravitreal  . aflibercept (EYLEA) SOLN 2 mg Intravitreal  . aflibercept (EYLEA) SOLN 2 mg Intravitreal  . aflibercept (EYLEA) SOLN 2 mg Intravitreal  . aflibercept (EYLEA) SOLN 2 mg Intravitreal  . aflibercept (EYLEA) SOLN 2 mg Intravitreal   Current Outpatient Medications (Other)  Medication Sig  . ACCU-CHEK GUIDE test strip every morning. Use to test fasting blood glucose  . AgaMatrix Ultra-Thin Lancets MISC 200 each by Misc.(Non-Drug; Combo Route) route 4 times daily. One touch delica  . aspirin 81 MG EC tablet Take by mouth.   Marland Kitchen atorvastatin (LIPITOR) 40 MG tablet TAKE 1 TABLET BY MOUTH DAILY  . B-D UF III MINI PEN NEEDLES 31G X 5 MM MISC   . Blood Glucose Monitoring Suppl (FIFTY50 GLUCOSE METER 2.0) w/Device KIT 1 each by Other route 4 times daily. Use as instructed one touch ultra  . cloNIDine (CATAPRES) 0.1 MG tablet Take 0.1 mg by mouth 2 (two) times daily.   . dapsone 25 MG tablet Take 50 mg by mouth daily.  . fluconazole (DIFLUCAN) 50 MG tablet   . furosemide (LASIX) 40 MG tablet Take 40 mg by mouth daily.  Marland Kitchen glimepiride (AMARYL) 4 MG tablet Take 4 mg by mouth daily with breakfast.   . HYDROcodone-acetaminophen (NORCO/VICODIN) 5-325 MG tablet Take 1 tablet by mouth every 4 (four) hours as needed for moderate pain.  . Insulin Glargine (BASAGLAR KWIKPEN) 100 UNIT/ML Inject into the skin.  Marland Kitchen insulin lispro (HUMALOG) 100 UNIT/ML injection Inject 5 units with breakfast and dinner and 4 units with lunch plus sliding scale as follows: BG 100-150 (1 unit); 151-200 (3 unit); 201-250 (4 unit); 251-300 (5 unit); 251-300 (6 unit); 351-400 (8 unit)  . K Phos Mono-Sod Phos Di & Mono (PHOSPHA 250 NEUTRAL) 155-852-130 MG TABS   . labetalol (NORMODYNE) 200 MG tablet Take 200 mg by mouth 3 (three) times daily.   Marland Kitchen lisinopril (PRINIVIL,ZESTRIL) 20 MG tablet Take 20 mg by mouth 2 (two) times a  day.   Marland Kitchen LOKELMA 10 g PACK packet Take 1 packet by mouth daily.  . magnesium oxide (MAG-OX) 400 MG tablet Take 1 tablet by mouth 2 (two) times daily.  . metFORMIN (GLUCOPHAGE) 1000 MG tablet Take 1,000 mg by mouth daily.  (Patient not taking: Reported on 09/20/2019)  . metFORMIN (GLUCOPHAGE-XR) 500 MG 24 hr tablet Take 500 mg by mouth 2 (two) times daily.  . Multiple Vitamin (MULTIVITAMIN WITH MINERALS) TABS tablet Take 1 tablet by mouth daily.  . Multiple Vitamin (MULTIVITAMIN) capsule Take by mouth. (Patient not taking: Reported on 09/20/2019)  . mycophenolate (MYFORTIC) 180 MG EC tablet Take by mouth.  Marland Kitchen NIFEdipine  (PROCARDIA-XL/NIFEDICAL-XL) 30 MG 24 hr tablet Take 30 mg by mouth daily.  Marland Kitchen NOVOLOG FLEXPEN 100 UNIT/ML FlexPen   . oxyCODONE (OXY IR/ROXICODONE) 5 MG immediate release tablet Take 5 mg by mouth every 4 (four) hours.  . pantoprazole (PROTONIX) 40 MG tablet Take by mouth.  . predniSONE (DELTASONE) 5 MG tablet Take by mouth.  . sodium bicarbonate 650 MG tablet Take by mouth.  . sulfamethoxazole-trimethoprim (BACTRIM) 400-80 MG tablet   . Tacrolimus ER 1 MG TB24 Take by mouth.  . valGANciclovir (VALCYTE) 450 MG tablet Take by mouth.   Current Facility-Administered Medications (Other)  Medication Route  . Bevacizumab (AVASTIN) SOLN 1.25 mg Intravitreal  . Bevacizumab (AVASTIN) SOLN 1.25 mg Intravitreal  . Bevacizumab (AVASTIN) SOLN 1.25 mg Intravitreal  . Bevacizumab (AVASTIN) SOLN 1.25 mg Intravitreal  . Bevacizumab (AVASTIN) SOLN 1.25 mg Intravitreal  . Bevacizumab (AVASTIN) SOLN 1.25 mg Intravitreal  . Bevacizumab (AVASTIN) SOLN 1.25 mg Intravitreal      REVIEW OF SYSTEMS: ROS    Positive for: Neurological, Genitourinary, Endocrine, Eyes   Negative for: Constitutional, Gastrointestinal, Skin, Musculoskeletal, HENT, Cardiovascular, Respiratory, Psychiatric, Allergic/Imm, Heme/Lymph   Last edited by Theodore Demark, COA on 09/08/2020  2:50 PM. (History)       ALLERGIES No Known Allergies  PAST MEDICAL HISTORY Past Medical History:  Diagnosis Date  . Cataract    OU  . Chronic kidney disease 05/04/2018  . Diabetes 1.5, managed as type 2 ([place])   . Diabetic retinopathy ([place])    NPDR OU  . Hypertension   . Hypertensive retinopathy    OU   Past Surgical History:  Procedure Laterality Date  . AV FISTULA PLACEMENT Left 09/28/2018   Procedure: Creation of Left arm Radiocephalic ARTERIOVENOUS (AV) FISTULA;  Surgeon: Marty Heck, MD;  Location: [facility];  Service: Vascular;  Laterality: Left;  Marland Kitchen MULTIPLE TOOTH EXTRACTIONS      FAMILY HISTORY Family History  Problem  Relation Age of Onset  . Heart failure Mother     SOCIAL HISTORY Social History   Tobacco Use  . Smoking status: Former Research scientist (life sciences)  . Smokeless tobacco: Never Used  . Tobacco comment: quit 20 years ago  Vaping Use  . Vaping Use: Never used  Substance Use Topics  . Alcohol use: Not Currently  . Drug use: Never         OPHTHALMIC EXAM:  Base Eye Exam    Visual Acuity (Snellen - Linear)      Right Left   Dist [place] 20/30 -2 20/30 +1   Dist ph [place] 20/25 -2 20/20 -1       Tonometry (Tonopen, 2:47 PM)      Right Left   Pressure 18 18       Pupils      Dark Light Shape React APD  Right 3 2 Round Brisk None   Left 3 2 Round Brisk None       Visual Fields (Counting fingers)      Left Right    Full Full       Extraocular Movement      Right Left    Full Full       Neuro/Psych    Oriented x3: Yes   Mood/Affect: Normal       Dilation    Both eyes: 1.0% Mydriacyl, 2.5% Phenylephrine @ 2:47 PM        Slit Lamp and Fundus Exam    Slit Lamp Exam      Right Left   Lids/Lashes Dermatochalasis - upper lid, mild Meibomian gland dysfunction Dermatochalasis - upper lid, mild Meibomian gland dysfunction   Conjunctiva/Sclera White and quiet White and quiet   Cornea trace PEE trace PEE   Anterior Chamber Deep and quiet, no cell or flare Deep and quiet, no cell or flare   Iris Round and dilated to 62m Round and dilated to 722m  Lens 1+ Nuclear sclerosis, 2+ Cortical cataract 1+ Nuclear sclerosis, 2+ Cortical cataract   Vitreous Mild Vitreous syneresis, Posterior vitreous detachment, Weiss ring Mild Vitreous syneresis, old white VH inferiorly       Fundus Exam      Right Left   Disc Pink and Sharp, mild pallor Pink and Sharp   C/D Ratio 0.2 0.3   Macula blunted foveal reflex, +ERM greatest superior to fovea, persistent cystic changes -- slightly improved Flat, good foveal reflex, scattered MA, trace cystic changes temporal macula - improved   Vessels attenuated, mild  tortuousity, mild Copper wiring mild attenuation, mild tortuousity   Periphery Attached, scattered MAs and DBH - mostly posterior, No RT/RD Attached, rare MA          IMAGING AND PROCEDURES  Imaging and Procedures for '@TODAY' @  OCT, Retina - OU - Both Eyes       Right Eye Quality was good. Central Foveal Thickness: 369. Progression has improved. Findings include intraretinal fluid, retinal drusen , intraretinal hyper-reflective material, no SRF, abnormal foveal contour, epiretinal membrane, macular pucker (Mild interval improvement IRF; +ERM).   Left Eye Quality was good. Central Foveal Thickness: 256. Progression has been stable. Findings include intraretinal fluid, epiretinal membrane, no SRF, intraretinal hyper-reflective material, normal foveal contour (Stable improvement in IRF temporal macula).   Notes *Images captured and stored on drive  Diagnosis / Impression:  DME OU OD: Mild interval improvement IRF; +ERM OS: Stable improvement in IRF temporal macula  Clinical management:  See below  Abbreviations: NFP - Normal foveal profile. CME - cystoid macular edema. PED - pigment epithelial detachment. IRF - intraretinal fluid. SRF - subretinal fluid. EZ - ellipsoid zone. ERM - epiretinal membrane. ORA - outer retinal atrophy. ORT - outer retinal tubulation. SRHM - subretinal hyper-reflective material        Intravitreal Injection, Pharmacologic Agent - OD - Right Eye       Time Out 09/08/2020. 3:24 PM. Confirmed correct patient, procedure, site, and patient consented.   Anesthesia Topical anesthesia was used. Anesthetic medications included Lidocaine 2%, Proparacaine 0.5%.   Procedure Preparation included 5% betadine to ocular surface, eyelid speculum. A supplied (32g) needle was used.   Injection:  2 mg aflibercept (EAlfonse FlavorsSOLN   NDC: [id number]: [id number] date: 05/05/2021   Route: Intravitreal, Site: Right Eye, Waste: 0.05 mL  Post-op Post  injection exam found visual acuity of at  least counting fingers. The patient tolerated the procedure well. There were no complications. The patient received written and verbal post procedure care education.                 ASSESSMENT/PLAN:    ICD-10-CM   1. 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 Retinal edema  H35.81 OCT, Retina - OU - Both Eyes  3. Essential hypertension  I10   4. Hypertensive retinopathy of both eyes  H35.033   5. Combined forms of age-related cataract of both eyes  H25.813     1, 2. Severe Non-proliferative diabetic retinopathy, both eyes  - initial exam showed massive central DME OU and scattered IRH and IRMA  - FA 6.5.19 with patches of capillary nonperfusion; extensive Mas with late leakage OU; no frank NV  - FA 01.02.20, shows improvement in late leaking microaneurysms OU  - S/P IVA OS #1 (06.05.19), #2 (07.08.19), #3 (07.08.19), #4 (09.03.19)  - S/P IVA OD #1 (06.07.19), #2 (07.08.19), #3 (07.08.19)  - S/P IVE OD #1 (09.03.19), #2 (10.03.19), #3 (11.04.19), #4 (12.02.19), #5 (01.02.20), #6 (02.07.20), #7 (03.16.20), #8 (05.20.20), #9 (06.22.20), #10 (07.27.20), #11(09.09.10), # 12 (10.28.20), #13 (11.25.20), #14 (04.06.21) - sample, #15 (05.04.21) - sample, #16 (06.18.21), #17 (07.30.21), #18 (09.10.21), #19 (11.16.21), #20 (1.7.22), #21 (4.12.22)  - S/P IVE OS #1 (10.03.19), #2 (11.04.19), #3 (12.02.19), #4 (01.02.20), #5 (02.07.20), #6 (03.16.20), #7 (05.20.20), #8 (06.22.20),#9 (07.27.20), #10 (09.09.20), #11 (10.28.20), #12 (11.25.20)  - OCT shows OD: Mild interval improvement IRF at 8 wks; +ERM; OS: Stable improvement in IRF temporal macula  - BCVA: OD 20/25 (improved),  OS 20/20-1 (stable)  - recommend IVE OD #22 today, 06.07.22 with f/u in 8 wks  - will hold off  on injection OS again today -- IRF improving without therapy and BCVA 20/20  - pt in agreement  - RBA of procedure discussed, questions answered  - informed consent obtained and signed  - see procedure note -- tolerated well  - Eylea paperwork and benefits investigation started on 08.05.19 -- approved for 2022  - f/u in 8 weeks -- DFE/OCT/possible IVE -- decrease interval if edema or vision worsens  3,4. Hypertensive retinopathy OU  - discussed importance of tight BP control  - had some Bps in hypertensive emergency range (200s / 110s)  - BP improved post kidney transplant  - discussed likely contribution to DME  - monitor             - BP management per nephrology, PCP and cardiology  5. Combined form age-related cataract OU-   - The symptoms of cataract, surgical options, and treatments and risks were discussed with patient.  - discussed diagnosis and progression  - not yet visually significant  - monitor for now  Ophthalmic Meds Ordered this visit:  Meds ordered this encounter  Medications  . aflibercept (EYLEA) SOLN 2 mg       Return in about 8 weeks (around 11/03/2020) for f/u NPDR OU, DFE, OCT.  There are no Patient Instructions on file for this visit.   Explained the diagnoses, plan, and follow up with the patient and they expressed understanding.  Patient expressed understanding of the importance of proper follow up care.   This document serves as a record of services personally performed by Sharyon Cable.  Coralyn Pear, MD, PhD. It was created on their behalf by Estill Bakes, [place] an ophthalmic technician. The creation of this record is the provider's dictation and/or activities during the visit.    Electronically signed by: Estill Bakes, COT 6.7.22 @ 9:48 PM   This document serves as a record of services personally performed by Gardiner Sleeper, MD, PhD. It was created on their behalf by San Jetty. Owens Shark, OA an ophthalmic technician. The creation of this record is the provider's  dictation and/or activities during the visit.    Electronically signed by: San Jetty. Marguerita Merles 06.07.2022 9:48 PM   Gardiner Sleeper, M.D., Ph.D. Diseases & Surgery of the Retina and Vitreous [facility]  I have reviewed the above documentation for accuracy and completeness, and I agree with the above. Gardiner Sleeper, M.D., Ph.D. 09/08/20 9:48 PM   Abbreviations: M myopia (nearsighted); A astigmatism; H hyperopia (farsighted); P presbyopia; Mrx spectacle prescription;  CTL contact lenses; OD right eye; OS left eye; OU both eyes  XT exotropia; ET esotropia; PEK punctate epithelial keratitis; PEE punctate epithelial erosions; DES dry eye syndrome; MGD meibomian gland dysfunction; ATs artificial tears; PFAT's preservative free artificial tears; [place] nuclear sclerotic cataract; PSC posterior subcapsular cataract; ERM epi-retinal membrane; PVD posterior vitreous detachment; RD retinal detachment; DM diabetes mellitus; DR diabetic retinopathy; NPDR non-proliferative diabetic retinopathy; PDR proliferative diabetic retinopathy; CSME clinically significant macular edema; DME diabetic macular edema; dbh dot blot hemorrhages; CWS cotton wool spot; POAG primary open angle glaucoma; C/D cup-to-disc ratio; HVF humphrey visual field; GVF goldmann visual field; OCT optical coherence tomography; IOP intraocular pressure; BRVO Branch retinal vein occlusion; CRVO central retinal vein occlusion; CRAO central retinal artery occlusion; BRAO branch retinal artery occlusion; RT retinal tear; SB scleral buckle; PPV pars plana vitrectomy; VH Vitreous hemorrhage; PRP panretinal laser photocoagulation; IVK intravitreal kenalog; VMT vitreomacular traction; MH Macular hole;  NVD neovascularization of the disc; NVE neovascularization elsewhere; AREDS age related eye disease study; ARMD age related macular degeneration; POAG primary open angle glaucoma; EBMD epithelial/anterior basement membrane dystrophy; ACIOL  anterior chamber intraocular lens; IOL intraocular lens; PCIOL posterior chamber intraocular lens; Phaco/IOL phacoemulsification with intraocular lens placement; [place] photorefractive keratectomy; LASIK laser assisted in situ keratomileusis; HTN hypertension; DM diabetes mellitus; COPD chronic obstructive pulmonary disease

## 2020-11-03 ENCOUNTER — Other Ambulatory Visit: Payer: Self-pay

## 2020-11-03 ENCOUNTER — Ambulatory Visit (INDEPENDENT_AMBULATORY_CARE_PROVIDER_SITE_OTHER): Payer: BC Managed Care – PPO | Admitting: Ophthalmology

## 2020-11-03 ENCOUNTER — Encounter (INDEPENDENT_AMBULATORY_CARE_PROVIDER_SITE_OTHER): Payer: Self-pay | Admitting: Ophthalmology

## 2020-11-03 DIAGNOSIS — E113413 Type 2 diabetes mellitus with severe nonproliferative diabetic retinopathy with macular edema, bilateral: Secondary | ICD-10-CM

## 2020-11-03 DIAGNOSIS — H35033 Hypertensive retinopathy, bilateral: Secondary | ICD-10-CM

## 2020-11-03 DIAGNOSIS — I10 Essential (primary) hypertension: Secondary | ICD-10-CM

## 2020-11-03 DIAGNOSIS — H25813 Combined forms of age-related cataract, bilateral: Secondary | ICD-10-CM

## 2020-11-03 DIAGNOSIS — H3581 Retinal edema: Secondary | ICD-10-CM

## 2020-11-03 NOTE — Progress Notes (Signed)
[facility] Note  11/03/2020     CHIEF COMPLAINT Patient presents for Retina Follow Up   HISTORY OF PRESENT ILLNESS: Philip Wilson is a 50 y.o. male who presents to the clinic today for:   HPI     Retina Follow Up   Patient presents with  Diabetic Retinopathy.  In both eyes.  This started 8 weeks ago.  I, the attending physician,  performed the HPI with the patient and updated documentation appropriately.        Comments   Patient here for 8 weeks retina follow up for NPDR OU. Patient states vision the same. no eye pain.      Last edited by Bernarda Caffey, MD on 11/04/2020 12:06 AM.    Pt states vision is doing well, pt states health is doing well, he is about to being released from the [facility] transplant team so he can follow with his own nephrologist  Referring physician: Phylliss Blakes, OD [address],  [place] [address]  HISTORICAL INFORMATION:   Selected notes from the MEDICAL RECORD NUMBER Referred by Dr. Ricki Clos for diabetic retinopathy OU   CURRENT MEDICATIONS: No current outpatient medications on file. (Ophthalmic Drugs)   Current Facility-Administered Medications (Ophthalmic Drugs)  Medication Route   aflibercept (EYLEA) SOLN 2 mg Intravitreal   aflibercept (EYLEA) SOLN 2 mg Intravitreal   aflibercept (EYLEA) SOLN 2 mg Intravitreal   aflibercept (EYLEA) SOLN 2 mg Intravitreal   aflibercept (EYLEA) SOLN 2 mg Intravitreal   aflibercept (EYLEA) SOLN 2 mg Intravitreal   aflibercept (EYLEA) SOLN 2 mg Intravitreal   aflibercept (EYLEA) SOLN 2 mg Intravitreal   aflibercept (EYLEA) SOLN 2 mg Intravitreal   aflibercept (EYLEA) SOLN 2 mg Intravitreal   aflibercept (EYLEA) SOLN 2 mg Intravitreal   Current Outpatient Medications (Other)  Medication Sig   ACCU-CHEK GUIDE test strip every morning. Use to test fasting blood glucose   AgaMatrix Ultra-Thin Lancets MISC 200 each by Misc.(Non-Drug; Combo Route) route 4 times  daily. One touch delica   aspirin 81 MG EC tablet Take by mouth.   atorvastatin (LIPITOR) 40 MG tablet TAKE 1 TABLET BY MOUTH DAILY   B-D UF III MINI PEN NEEDLES 31G X 5 MM MISC    Blood Glucose Monitoring Suppl (FIFTY50 GLUCOSE METER 2.0) w/Device KIT 1 each by Other route 4 times daily. Use as instructed one touch ultra   cloNIDine (CATAPRES) 0.1 MG tablet Take 0.1 mg by mouth 2 (two) times daily.    dapsone 25 MG tablet Take 50 mg by mouth daily.   fluconazole (DIFLUCAN) 50 MG tablet    furosemide (LASIX) 40 MG tablet Take 40 mg by mouth daily.   glimepiride (AMARYL) 4 MG tablet Take 4 mg by mouth daily with breakfast.    HYDROcodone-acetaminophen (NORCO/VICODIN) 5-325 MG tablet Take 1 tablet by mouth every 4 (four) hours as needed for moderate pain.   Insulin Glargine (BASAGLAR KWIKPEN) 100 UNIT/ML Inject into the skin.   insulin lispro (HUMALOG) 100 UNIT/ML injection Inject 5 units with breakfast and dinner and 4 units with lunch plus sliding scale as follows: BG 100-150 (1 unit); 151-200 (3 unit); 201-250 (4 unit); 251-300 (5 unit); 251-300 (6 unit); 351-400 (8 unit)   K Phos Mono-Sod Phos Di & Mono (PHOSPHA 250 NEUTRAL) [phone] MG TABS    labetalol (NORMODYNE) 200 MG tablet Take 200 mg by mouth 3 (three) times daily.    lisinopril (PRINIVIL,ZESTRIL) 20 MG  tablet Take 20 mg by mouth 2 (two) times a day.    LOKELMA 10 g PACK packet Take 1 packet by mouth daily.   magnesium oxide (MAG-OX) 400 MG tablet Take 1 tablet by mouth 2 (two) times daily.   metFORMIN (GLUCOPHAGE) 1000 MG tablet Take 1,000 mg by mouth daily.  (Patient not taking: Reported on 09/20/2019)   metFORMIN (GLUCOPHAGE-XR) 500 MG 24 hr tablet Take 500 mg by mouth 2 (two) times daily.   Multiple Vitamin (MULTIVITAMIN WITH MINERALS) TABS tablet Take 1 tablet by mouth daily.   Multiple Vitamin (MULTIVITAMIN) capsule Take by mouth. (Patient not taking: Reported on 09/20/2019)   mycophenolate (MYFORTIC) 180 MG EC tablet Take by  mouth.   NIFEdipine (PROCARDIA-XL/NIFEDICAL-XL) 30 MG 24 hr tablet Take 30 mg by mouth daily.   NOVOLOG FLEXPEN 100 UNIT/ML FlexPen    oxyCODONE (OXY IR/ROXICODONE) 5 MG immediate release tablet Take 5 mg by mouth every 4 (four) hours.   pantoprazole (PROTONIX) 40 MG tablet Take by mouth.   predniSONE (DELTASONE) 5 MG tablet Take by mouth.   sodium bicarbonate 650 MG tablet Take by mouth.   sulfamethoxazole-trimethoprim (BACTRIM) 400-80 MG tablet    Tacrolimus ER 1 MG TB24 Take by mouth.   valGANciclovir (VALCYTE) 450 MG tablet Take by mouth.   Current Facility-Administered Medications (Other)  Medication Route   Bevacizumab (AVASTIN) SOLN 1.25 mg Intravitreal   Bevacizumab (AVASTIN) SOLN 1.25 mg Intravitreal   Bevacizumab (AVASTIN) SOLN 1.25 mg Intravitreal   Bevacizumab (AVASTIN) SOLN 1.25 mg Intravitreal   Bevacizumab (AVASTIN) SOLN 1.25 mg Intravitreal   Bevacizumab (AVASTIN) SOLN 1.25 mg Intravitreal   Bevacizumab (AVASTIN) SOLN 1.25 mg Intravitreal      REVIEW OF SYSTEMS: ROS   Positive for: Neurological, Genitourinary, Endocrine, Eyes Negative for: Constitutional, Gastrointestinal, Skin, Musculoskeletal, HENT, Cardiovascular, Respiratory, Psychiatric, Allergic/Imm, Heme/Lymph Last edited by Theodore Demark, COA on 11/03/2020  2:30 PM.        ALLERGIES No Known Allergies  PAST MEDICAL HISTORY Past Medical History:  Diagnosis Date   Cataract    OU   Chronic kidney disease 05/04/2018   Diabetes 1.5, managed as type 2 (HCC)    Diabetic retinopathy ([place])    NPDR OU   Hypertension    Hypertensive retinopathy    OU   Past Surgical History:  Procedure Laterality Date   AV FISTULA PLACEMENT Left 09/28/2018   Procedure: Creation of Left arm Radiocephalic ARTERIOVENOUS (AV) FISTULA;  Surgeon: Marty Heck, MD;  Location: [facility] OR;  Service: Vascular;  Laterality: Left;   MULTIPLE TOOTH EXTRACTIONS      FAMILY HISTORY Family History  Problem Relation Age of  Onset   Heart failure Mother     SOCIAL HISTORY Social History   Tobacco Use   Smoking status: Former   Smokeless tobacco: Never   Tobacco comments:    quit 20 years ago  Vaping Use   Vaping Use: Never used  Substance Use Topics   Alcohol use: Not Currently   Drug use: Never         OPHTHALMIC EXAM:  Base Eye Exam     Visual Acuity (Snellen - Linear)       Right Left   Dist [place] 20/25 -1 20/30   Dist ph [place] 20/20 -1 20/20         Tonometry (Tonopen, 2:28 PM)       Right Left   Pressure 20 16         Pupils  Dark Light Shape React APD   Right 3 2 Round Brisk None   Left 3 2 Round Brisk None         Visual Fields (Counting fingers)       Left Right    Full Full         Extraocular Movement       Right Left    Full Full         Neuro/Psych     Oriented x3: Yes   Mood/Affect: Normal         Dilation     Both eyes: 1.0% Mydriacyl, 2.5% Phenylephrine @ 2:27 PM           Slit Lamp and Fundus Exam     Slit Lamp Exam       Right Left   Lids/Lashes Dermatochalasis - upper lid, mild Meibomian gland dysfunction Dermatochalasis - upper lid, mild Meibomian gland dysfunction   Conjunctiva/Sclera White and quiet White and quiet   Cornea trace PEE trace PEE   Anterior Chamber Deep and quiet, no cell or flare Deep and quiet, no cell or flare   Iris Round and dilated to 78m Round and dilated to 758m  Lens 1+ Nuclear sclerosis, 2+ Cortical cataract 1+ Nuclear sclerosis, 2+ Cortical cataract   Vitreous Mild Vitreous syneresis, Posterior vitreous detachment, Weiss ring Mild Vitreous syneresis, old white VH inferiorly         Fundus Exam       Right Left   Disc Pink and Sharp, mild pallor Pink and Sharp   C/D Ratio 0.2 0.3   Macula good foveal reflex, +ERM greatest superior to fovea, persistent cystic changes -- slightly improved Flat, good foveal reflex, scattered MA, trace cystic changes temporal macula - improved   Vessels  attenuated, mild tortuousity, mild Copper wiring mild attenuation, mild tortuousity   Periphery Attached, scattered MAs and DBH - mostly posterior, No RT/RD Attached, rare MA            IMAGING AND PROCEDURES  Imaging and Procedures for '@TODAY' @  OCT, Retina - OU - Both Eyes       Right Eye Quality was good. Central Foveal Thickness: 349. Progression has improved. Findings include intraretinal fluid, retinal drusen , intraretinal hyper-reflective material, no SRF, abnormal foveal contour, epiretinal membrane, macular pucker (Mild interval improvement in IRF central and temporal macula; +ERM).   Left Eye Quality was good. Central Foveal Thickness: 253. Progression has been stable. Findings include epiretinal membrane, no SRF, intraretinal hyper-reflective material, normal foveal contour, no IRF (Focal IRF ST macula best seen on widefield -- improved).   Notes *Images captured and stored on drive  Diagnosis / Impression:  DME OU OD: Mild interval improvement in IRF central and temporal macula; +ERM OS: Focal IRF ST macula best seen on widefield -- improved  Clinical management:  See below  Abbreviations: NFP - Normal foveal profile. CME - cystoid macular edema. PED - pigment epithelial detachment. IRF - intraretinal fluid. SRF - subretinal fluid. EZ - ellipsoid zone. ERM - epiretinal membrane. ORA - outer retinal atrophy. ORT - outer retinal tubulation. SRHM - subretinal hyper-reflective material      Intravitreal Injection, Pharmacologic Agent - OD - Right Eye       Time Out 11/03/2020. 3:30 PM. Confirmed correct patient, procedure, site, and patient consented.   Anesthesia Topical anesthesia was used. Anesthetic medications included Lidocaine 2%, Proparacaine 0.5%.   Procedure Preparation included 5% betadine to ocular surface, eyelid speculum.  A (32g) needle was used.   Injection: 2 mg aflibercept 2 MG/0.05ML   Route: Intravitreal, Site: Right Eye   NDC:  [id number], Lot: [id number], Expiration date: 08/02/2021, Waste: 0.05 mL   Post-op Post injection exam found visual acuity of at least counting fingers. The patient tolerated the procedure well. There were no complications. The patient received written and verbal post procedure care education. Post injection medications were not given.            ASSESSMENT/PLAN:    ICD-10-CM   1. 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 Retinal edema  H35.81 OCT, Retina - OU - Both Eyes    3. Essential hypertension  I10     4. Hypertensive retinopathy of both eyes  H35.033     5. Combined forms of age-related cataract of both eyes  H25.813       1, 2. Severe Non-proliferative diabetic retinopathy, both eyes  - initial exam showed massive central DME OU and scattered IRH and IRMA  - FA 6.5.19 with patches of capillary nonperfusion; extensive Mas with late leakage OU; no frank NV  - FA 01.02.20, shows improvement in late leaking microaneurysms OU  - S/P IVA OS #1 (06.05.19), #2 (07.08.19), #3 (07.08.19), #4 (09.03.19)  - S/P IVA OD #1 (06.07.19), #2 (07.08.19), #3 (07.08.19)  - S/P IVE OD #1 (09.03.19), #2 (10.03.19), #3 (11.04.19), #4 (12.02.19), #5 (01.02.20), #6 (02.07.20), #7 (03.16.20), #8 (05.20.20), #9 (06.22.20), #10 (07.27.20), #11(09.09.10), # 12 (10.28.20), #13 (11.25.20), #14 (04.06.21) - sample, #15 (05.04.21) - sample, #16 (06.18.21), #17 (07.30.21), #18 (09.10.21), #19 (11.16.21), #20 (1.7.22), #21 (4.12.22), #22 (06.07.22)  - S/P IVE OS #1 (10.03.19), #2 (11.04.19), #3 (12.02.19), #4 (01.02.20), #5 (02.07.20), #6 (03.16.20), #7 (05.20.20), #8 (06.22.20),#9 (07.27.20), #10 (09.09.20), #11 (10.28.20), #12 (11.25.20)  - OCT shows OD: Mild interval improvement in IRF central and temporal macula;  +ERM; OS: Focal IRF ST macula best seen on widefield -- improved at 89 wks  - BCVA: OD 20/20 (improved),  OS 20/20-1 (stable)  - recommend IVE OD #23 today, 08.01.22 with f/u in 8 wks  - will hold off on injection OS again today -- IRF improving without therapy and BCVA 20/20  - pt in agreement  - RBA of procedure discussed, questions answered  - informed consent obtained and signed  - see procedure note -- tolerated well  - Eylea paperwork and benefits investigation started on 08.05.19 -- approved for 2022  - f/u in 8 weeks -- DFE/OCT/possible IVE   3,4. Hypertensive retinopathy OU  - discussed importance of tight BP control  - had some Bps in hypertensive emergency range (200s / 110s)  - BP improved post kidney transplant  - discussed likely contribution to DME  - monitor             - BP management per nephrology, PCP and cardiology  5. Combined form age-related cataract OU-   - The symptoms of cataract, surgical options, and treatments and risks were discussed with patient.  - discussed diagnosis and progression  - not yet visually significant  - monitor for now  Ophthalmic Meds Ordered this visit:  Meds ordered this encounter  Medications   aflibercept (EYLEA) SOLN 2 mg       Return in about 8 weeks (around 12/29/2020) for  f/u NPDR OU, DFE, OCT.  There are no Patient Instructions on file for this visit.  Explained the diagnoses, plan, and follow up with the patient and they expressed understanding.  Patient expressed understanding of the importance of proper follow up care.   This document serves as a record of services personally performed by Gardiner Sleeper, MD, PhD. It was created on their behalf by Estill Bakes, COT an ophthalmic technician. The creation of this record is the provider's dictation and/or activities during the visit.    Electronically signed by: Estill Bakes, COT 8.2.22 @ 12:10 AM   Gardiner Sleeper, M.D., Ph.D. Diseases & Surgery of the Retina and  Vitreous [facility]  I have reviewed the above documentation for accuracy and completeness, and I agree with the above. Gardiner Sleeper, M.D., Ph.D. 11/04/20 12:10 AM   Abbreviations: M myopia (nearsighted); A astigmatism; H hyperopia (farsighted); P presbyopia; Mrx spectacle prescription;  CTL contact lenses; OD right eye; OS left eye; OU both eyes  XT exotropia; ET esotropia; PEK punctate epithelial keratitis; PEE punctate epithelial erosions; DES dry eye syndrome; MGD meibomian gland dysfunction; ATs artificial tears; PFAT's preservative free artificial tears; [place] nuclear sclerotic cataract; PSC posterior subcapsular cataract; ERM epi-retinal membrane; PVD posterior vitreous detachment; RD retinal detachment; DM diabetes mellitus; DR diabetic retinopathy; NPDR non-proliferative diabetic retinopathy; PDR proliferative diabetic retinopathy; CSME clinically significant macular edema; DME diabetic macular edema; dbh dot blot hemorrhages; CWS cotton wool spot; POAG primary open angle glaucoma; C/D cup-to-disc ratio; HVF humphrey visual field; GVF goldmann visual field; OCT optical coherence tomography; IOP intraocular pressure; BRVO Branch retinal vein occlusion; CRVO central retinal vein occlusion; CRAO central retinal artery occlusion; BRAO branch retinal artery occlusion; RT retinal tear; SB scleral buckle; PPV pars plana vitrectomy; VH Vitreous hemorrhage; PRP panretinal laser photocoagulation; IVK intravitreal kenalog; VMT vitreomacular traction; MH Macular hole;  NVD neovascularization of the disc; NVE neovascularization elsewhere; AREDS age related eye disease study; ARMD age related macular degeneration; POAG primary open angle glaucoma; EBMD epithelial/anterior basement membrane dystrophy; ACIOL anterior chamber intraocular lens; IOL intraocular lens; PCIOL posterior chamber intraocular lens; Phaco/IOL phacoemulsification with intraocular lens placement; [place] photorefractive  keratectomy; LASIK laser assisted in situ keratomileusis; HTN hypertension; DM diabetes mellitus; COPD chronic obstructive pulmonary disease

## 2020-11-04 ENCOUNTER — Encounter (INDEPENDENT_AMBULATORY_CARE_PROVIDER_SITE_OTHER): Payer: Self-pay | Admitting: Ophthalmology

## 2020-11-04 MED ORDER — AFLIBERCEPT 2MG/0.05ML IZ SOLN FOR KALEIDOSCOPE
2.0000 mg | INTRAVITREAL | Status: AC | PRN
  Administered 2020-11-03: 2 mg via INTRAVITREAL

## 2020-12-29 ENCOUNTER — Encounter (INDEPENDENT_AMBULATORY_CARE_PROVIDER_SITE_OTHER): Payer: Medicare Other | Admitting: Ophthalmology

## 2020-12-29 DIAGNOSIS — H25813 Combined forms of age-related cataract, bilateral: Secondary | ICD-10-CM

## 2020-12-29 DIAGNOSIS — I10 Essential (primary) hypertension: Secondary | ICD-10-CM

## 2020-12-29 DIAGNOSIS — H3581 Retinal edema: Secondary | ICD-10-CM

## 2020-12-29 DIAGNOSIS — H35033 Hypertensive retinopathy, bilateral: Secondary | ICD-10-CM

## 2020-12-29 DIAGNOSIS — E113413 Type 2 diabetes mellitus with severe nonproliferative diabetic retinopathy with macular edema, bilateral: Secondary | ICD-10-CM

## 2021-01-08 NOTE — Progress Notes (Signed)
[facility] Note  01/12/2021     CHIEF COMPLAINT Patient presents for Retina Follow Up  HISTORY OF PRESENT ILLNESS: Philip Wilson is a 50 y.o. male who presents to the clinic today for:   HPI     Retina Follow Up   Patient presents with  Diabetic Retinopathy.  In both eyes.  Severity is severe.  Duration of 10 weeks.  I, the attending physician,  performed the HPI with the patient and updated documentation appropriately.        Comments   Pt here for 10 wk ret f/u NPDR OU, 2 wks late for 8 wk appt. Pt states vision is the same, no changes. Blood sugar this AM was 110. Most recent A1C around 6.       Last edited by Bernarda Caffey, MD on 01/12/2021  9:37 PM.     Referring physician: Phylliss Blakes, OD [address] [place],  [place] [address]  HISTORICAL INFORMATION:   Selected notes from the MEDICAL RECORD NUMBER Referred by Dr. Ricki Reas for diabetic retinopathy OU   CURRENT MEDICATIONS: No current outpatient medications on file. (Ophthalmic Drugs)   Current Facility-Administered Medications (Ophthalmic Drugs)  Medication Route   aflibercept (EYLEA) SOLN 2 mg Intravitreal   aflibercept (EYLEA) SOLN 2 mg Intravitreal   aflibercept (EYLEA) SOLN 2 mg Intravitreal   aflibercept (EYLEA) SOLN 2 mg Intravitreal   aflibercept (EYLEA) SOLN 2 mg Intravitreal   aflibercept (EYLEA) SOLN 2 mg Intravitreal   aflibercept (EYLEA) SOLN 2 mg Intravitreal   aflibercept (EYLEA) SOLN 2 mg Intravitreal   aflibercept (EYLEA) SOLN 2 mg Intravitreal   aflibercept (EYLEA) SOLN 2 mg Intravitreal   aflibercept (EYLEA) SOLN 2 mg Intravitreal   Current Outpatient Medications (Other)  Medication Sig   ACCU-CHEK GUIDE test strip every morning. Use to test fasting blood glucose   AgaMatrix Ultra-Thin Lancets MISC 200 each by Misc.(Non-Drug; Combo Route) route 4 times daily. One touch delica   aspirin 81 MG EC tablet Take by mouth.   atorvastatin (LIPITOR) 40  MG tablet TAKE 1 TABLET BY MOUTH DAILY   B-D UF III MINI PEN NEEDLES 31G X 5 MM MISC    Blood Glucose Monitoring Suppl (FIFTY50 GLUCOSE METER 2.0) w/Device KIT 1 each by Other route 4 times daily. Use as instructed one touch ultra   cloNIDine (CATAPRES) 0.1 MG tablet Take 0.1 mg by mouth 2 (two) times daily.    dapsone 25 MG tablet Take 50 mg by mouth daily.   fluconazole (DIFLUCAN) 50 MG tablet    furosemide (LASIX) 40 MG tablet Take 40 mg by mouth daily.   glimepiride (AMARYL) 4 MG tablet Take 4 mg by mouth daily with breakfast.    HYDROcodone-acetaminophen (NORCO/VICODIN) 5-325 MG tablet Take 1 tablet by mouth every 4 (four) hours as needed for moderate pain.   Insulin Glargine (BASAGLAR KWIKPEN) 100 UNIT/ML Inject into the skin.   insulin lispro (HUMALOG) 100 UNIT/ML injection Inject 5 units with breakfast and dinner and 4 units with lunch plus sliding scale as follows: BG 100-150 (1 unit); 151-200 (3 unit); 201-250 (4 unit); 251-300 (5 unit); 251-300 (6 unit); 351-400 (8 unit)   K Phos Mono-Sod Phos Di & Mono (PHOSPHA 250 NEUTRAL) [phone] MG TABS    labetalol (NORMODYNE) 200 MG tablet Take 200 mg by mouth 3 (three) times daily.    lisinopril (PRINIVIL,ZESTRIL) 20 MG tablet Take 20 mg by mouth 2 (two) times a day.  LOKELMA 10 g PACK packet Take 1 packet by mouth daily.   magnesium oxide (MAG-OX) 400 MG tablet Take 1 tablet by mouth 2 (two) times daily.   metFORMIN (GLUCOPHAGE) 1000 MG tablet Take 1,000 mg by mouth daily.  (Patient not taking: Reported on 09/20/2019)   metFORMIN (GLUCOPHAGE-XR) 500 MG 24 hr tablet Take 500 mg by mouth 2 (two) times daily.   Multiple Vitamin (MULTIVITAMIN WITH MINERALS) TABS tablet Take 1 tablet by mouth daily.   Multiple Vitamin (MULTIVITAMIN) capsule Take by mouth. (Patient not taking: Reported on 09/20/2019)   mycophenolate (MYFORTIC) 180 MG EC tablet Take by mouth.   NIFEdipine (PROCARDIA-XL/NIFEDICAL-XL) 30 MG 24 hr tablet Take 30 mg by mouth daily.    NOVOLOG FLEXPEN 100 UNIT/ML FlexPen    oxyCODONE (OXY IR/ROXICODONE) 5 MG immediate release tablet Take 5 mg by mouth every 4 (four) hours.   pantoprazole (PROTONIX) 40 MG tablet Take by mouth.   predniSONE (DELTASONE) 5 MG tablet Take by mouth.   sodium bicarbonate 650 MG tablet Take by mouth.   sulfamethoxazole-trimethoprim (BACTRIM) 400-80 MG tablet    Tacrolimus ER 1 MG TB24 Take by mouth.   valGANciclovir (VALCYTE) 450 MG tablet Take by mouth.   Current Facility-Administered Medications (Other)  Medication Route   Bevacizumab (AVASTIN) SOLN 1.25 mg Intravitreal   Bevacizumab (AVASTIN) SOLN 1.25 mg Intravitreal   Bevacizumab (AVASTIN) SOLN 1.25 mg Intravitreal   Bevacizumab (AVASTIN) SOLN 1.25 mg Intravitreal   Bevacizumab (AVASTIN) SOLN 1.25 mg Intravitreal   Bevacizumab (AVASTIN) SOLN 1.25 mg Intravitreal   Bevacizumab (AVASTIN) SOLN 1.25 mg Intravitreal   REVIEW OF SYSTEMS: ROS   Positive for: Neurological, Genitourinary, Endocrine, Eyes Negative for: Constitutional, Gastrointestinal, Skin, Musculoskeletal, HENT, Cardiovascular, Respiratory, Psychiatric, Allergic/Imm, Heme/Lymph Last edited by Kingsley Spittle, COT on 01/12/2021  1:52 PM.    ALLERGIES No Known Allergies  PAST MEDICAL HISTORY Past Medical History:  Diagnosis Date   Cataract    OU   Chronic kidney disease 05/04/2018   Diabetes 1.5, managed as type 2 (HCC)    Diabetic retinopathy ([place])    NPDR OU   Hypertension    Hypertensive retinopathy    OU   Past Surgical History:  Procedure Laterality Date   AV FISTULA PLACEMENT Left 09/28/2018   Procedure: Creation of Left arm Radiocephalic ARTERIOVENOUS (AV) FISTULA;  Surgeon: Marty Heck, MD;  Location: [facility] OR;  Service: Vascular;  Laterality: Left;   MULTIPLE TOOTH EXTRACTIONS     FAMILY HISTORY Family History  Problem Relation Age of Onset   Heart failure Mother    SOCIAL HISTORY Social History   Tobacco Use   Smoking status: Former    Smokeless tobacco: Never   Tobacco comments:    quit 20 years ago  Vaping Use   Vaping Use: Never used  Substance Use Topics   Alcohol use: Not Currently   Drug use: Never       OPHTHALMIC EXAM:  Base Eye Exam     Visual Acuity (Snellen - Linear)       Right Left   Dist [place] 20/60 +2 20/30   Dist ph [place] 20/30 -2 20/20         Tonometry (Tonopen, 2:10 PM)       Right Left   Pressure 16 22         Pupils       Dark Light Shape React APD   Right 3 2 Round Brisk None   Left 3 2 Round  Brisk None         Visual Fields (Counting fingers)       Left Right    Full Full         Extraocular Movement       Right Left    Full, Ortho Full, Ortho         Neuro/Psych     Oriented x3: Yes   Mood/Affect: Normal         Dilation     Both eyes: 1.0% Mydriacyl, 2.5% Phenylephrine @ 2:11 PM           Slit Lamp and Fundus Exam     Slit Lamp Exam       Right Left   Lids/Lashes Dermatochalasis - upper lid, mild Meibomian gland dysfunction Dermatochalasis - upper lid, mild Meibomian gland dysfunction   Conjunctiva/Sclera White and quiet White and quiet   Cornea trace PEE trace PEE   Anterior Chamber Deep and quiet, no cell or flare Deep and quiet, no cell or flare   Iris Round and dilated to 6m Round and dilated to 717m  Lens 1+ Nuclear sclerosis, 2+ Cortical cataract 1+ Nuclear sclerosis, 2+ Cortical cataract   Vitreous Mild Vitreous syneresis, Posterior vitreous detachment, Weiss ring Mild Vitreous syneresis, old white VH inferiorly         Fundus Exam       Right Left   Disc Pink and Sharp, mild pallor Pink and Sharp   C/D Ratio 0.2 0.3   Macula good foveal reflex, +ERM greatest superior to fovea, persistent cystic changes -- slightly increased Flat, good foveal reflex, scattered MA, trace cystic changes temporal macula - stably improved   Vessels attenuated, mild tortuousity, mild Copper wiring mild attenuation, mild tortuousity   Periphery  Attached, scattered MAs and DBH - mostly posterior, No RT/RD Attached, rare MA           Refraction     Manifest Refraction       Sphere Cylinder Axis Dist VA   Right -1.25 +1.25 180 20/30+1   Left               IMAGING AND PROCEDURES  Imaging and Procedures for '@TODAY' @  OCT, Retina - OU - Both Eyes       Right Eye Quality was good. Central Foveal Thickness: 375. Progression has worsened. Findings include intraretinal fluid, retinal drusen , intraretinal hyper-reflective material, no SRF, abnormal foveal contour, epiretinal membrane, macular pucker (Mild interval increase in IRF central and temporal macula; +ERM and macular pucker).   Left Eye Quality was good. Central Foveal Thickness: 252. Progression has been stable. Findings include epiretinal membrane, no SRF, intraretinal hyper-reflective material, normal foveal contour, no IRF (Tr cystic changes).   Notes *Images captured and stored on drive  Diagnosis / Impression:  DME OU OD: Mild interval increase in IRF central and temporal macula; +ERM and macular pucker OS: tr cystic changes  Clinical management:  See below  Abbreviations: NFP - Normal foveal profile. CME - cystoid macular edema. PED - pigment epithelial detachment. IRF - intraretinal fluid. SRF - subretinal fluid. EZ - ellipsoid zone. ERM - epiretinal membrane. ORA - outer retinal atrophy. ORT - outer retinal tubulation. SRHM - subretinal hyper-reflective material      Intravitreal Injection, Pharmacologic Agent - OD - Right Eye       Time Out 01/12/2021. 3:01 PM. Confirmed correct patient, procedure, site, and patient consented.   Anesthesia Topical anesthesia was used. Anesthetic medications  included Lidocaine 2%, Proparacaine 0.5%.   Procedure Preparation included 5% betadine to ocular surface, eyelid speculum. A (32g) needle was used.   Injection: 2 mg aflibercept 2 MG/0.05ML   Route: Intravitreal, Site: Right Eye   NDC: [id number],  Lot: [id number], Expiration date: 12/02/2021, Waste: 0.05 mL   Post-op Post injection exam found visual acuity of at least counting fingers. The patient tolerated the procedure well. There were no complications. The patient received written and verbal post procedure care education. Post injection medications were not given.            ASSESSMENT/PLAN:    ICD-10-CM   1. Severe nonproliferative diabetic retinopathy of both eyes with macular edema associated with type 2 diabetes mellitus (HCC)  V37.1062 Intravitreal Injection, Pharmacologic Agent - OD - Right Eye    aflibercept (EYLEA) SOLN 2 mg    2. Retinal edema  H35.81 OCT, Retina - OU - Both Eyes    3. Essential hypertension  I10     4. Hypertensive retinopathy of both eyes  H35.033     5. Combined forms of age-related cataract of both eyes  H25.813     1, 2. Severe Non-proliferative diabetic retinopathy, both eyes  - delayed f/u - 10 wks instead of 8  - initial exam showed massive central DME OU and scattered IRH and IRMA  - FA 6.5.19 with patches of capillary nonperfusion; extensive Mas with late leakage OU; no frank NV  - FA 01.02.20, shows improvement in late leaking microaneurysms OU  - S/P IVA OS #1 (06.05.19), #2 (07.08.19), #3 (07.08.19), #4 (09.03.19)  - S/P IVA OD #1 (06.07.19), #2 (07.08.19), #3 (07.08.19)  - S/P IVE OD #1 (09.03.19), #2 (10.03.19), #3 (11.04.19), #4 (12.02.19), #5 (01.02.20), #6 (02.07.20), #7 (03.16.20), #8 (05.20.20), #9 (06.22.20), #10 (07.27.20), #11(09.09.10), # 12 (10.28.20), #13 (11.25.20), #14 (04.06.21) - sample, #15 (05.04.21) - sample, #16 (06.18.21), #17 (07.30.21), #18 (09.10.21), #19 (11.16.21), #20 (1.7.22), #21 (4.12.22), #22 (06.07.22), #23 (8.1.22)  - S/P IVE OS #1 (10.03.19), #2 (11.04.19), #3 (12.02.19), #4 (01.02.20), #5 (02.07.20), #6 (03.16.20), #7 (05.20.20), #8 (06.22.20),#9 (07.27.20), #10 (09.09.20), #11 (10.28.20), #12 (11.25.20)  - OCT shows OD: Mild interval increase in IRF  central and temporal macula at 10 wks; +ERM and macular pucker; OS: tr cystic changes  - BCVA: OD 20/30+ (down from 20/20),  OS 20/20-1 (stable)  - recommend IVE OD #24 today, 10.11.22 with dec f/u to 8 wks  - will hold off on injection OS again today -- IRF improving without therapy and BCVA 20/20  - pt in agreement  - RBA of procedure discussed, questions answered  - informed consent obtained and signed  - see procedure note -- tolerated well  - Eylea paperwork and benefits investigation started on 08.05.19 -- approved for 2022  - f/u in 8 weeks -- DFE/OCT/possible IVE   3,4. Hypertensive retinopathy OU  - discussed importance of tight BP control  - had some Bps in hypertensive emergency range (200s / 110s)  - BP improved post kidney transplant  - discussed likely contribution to DME  - monitor             - BP management per nephrology, PCP and cardiology  5. Combined form age-related cataract OU-   - The symptoms of cataract, surgical options, and treatments and risks were discussed with patient.  - discussed diagnosis and progression  - not yet visually significant  - monitor for now  Ophthalmic Meds Ordered this visit:  Meds  ordered this encounter  Medications   aflibercept (EYLEA) SOLN 2 mg     Return in about 8 weeks (around 03/09/2021) for 8 wk f/u for severe NPDR w/DME OU w/DFE/OCT/likely inj. OD.  There are no Patient Instructions on file for this visit.  Explained the diagnoses, plan, and follow up with the patient and they expressed understanding.  Patient expressed understanding of the importance of proper follow up care.   This document serves as a record of services personally performed by Gardiner Sleeper, MD, PhD. It was created on their behalf by Estill Bakes, COT an ophthalmic technician. The creation of this record is the provider's dictation and/or activities during the visit.    Electronically signed by: Estill Bakes, COT 10.7.22 @ 9:43 PM  This  document serves as a record of services personally performed by Gardiner Sleeper, MD, PhD. It was created on their behalf by Estill Bakes, COT an ophthalmic technician. The creation of this record is the provider's dictation and/or activities during the visit.    Electronically signed by: Estill Bakes, [place] 10.11.22 @ 9:43 PM    Gardiner Sleeper, M.D., Ph.D. Diseases & Surgery of the Retina and [place] 10.11.22  I have reviewed the above documentation for accuracy and completeness, and I agree with the above. Gardiner Sleeper, M.D., Ph.D. 01/12/21 9:43 PM  Abbreviations: M myopia (nearsighted); A astigmatism; H hyperopia (farsighted); P presbyopia; Mrx spectacle prescription;  CTL contact lenses; OD right eye; OS left eye; OU both eyes  XT exotropia; ET esotropia; PEK punctate epithelial keratitis; PEE punctate epithelial erosions; DES dry eye syndrome; MGD meibomian gland dysfunction; ATs artificial tears; PFAT's preservative free artificial tears; [place] nuclear sclerotic cataract; PSC posterior subcapsular cataract; ERM epi-retinal membrane; PVD posterior vitreous detachment; RD retinal detachment; DM diabetes mellitus; DR diabetic retinopathy; NPDR non-proliferative diabetic retinopathy; PDR proliferative diabetic retinopathy; CSME clinically significant macular edema; DME diabetic macular edema; dbh dot blot hemorrhages; CWS cotton wool spot; POAG primary open angle glaucoma; C/D cup-to-disc ratio; HVF humphrey visual field; GVF goldmann visual field; OCT optical coherence tomography; IOP intraocular pressure; BRVO Branch retinal vein occlusion; CRVO central retinal vein occlusion; CRAO central retinal artery occlusion; BRAO branch retinal artery occlusion; RT retinal tear; SB scleral buckle; PPV pars plana vitrectomy; VH Vitreous hemorrhage; PRP panretinal laser photocoagulation; IVK intravitreal kenalog; VMT vitreomacular traction; MH Macular hole;  NVD  neovascularization of the disc; NVE neovascularization elsewhere; AREDS age related eye disease study; ARMD age related macular degeneration; POAG primary open angle glaucoma; EBMD epithelial/anterior basement membrane dystrophy; ACIOL anterior chamber intraocular lens; IOL intraocular lens; PCIOL posterior chamber intraocular lens; Phaco/IOL phacoemulsification with intraocular lens placement; [place] photorefractive keratectomy; LASIK laser assisted in situ keratomileusis; HTN hypertension; DM diabetes mellitus; COPD chronic obstructive pulmonary disease

## 2021-01-12 ENCOUNTER — Encounter (INDEPENDENT_AMBULATORY_CARE_PROVIDER_SITE_OTHER): Payer: Self-pay | Admitting: Ophthalmology

## 2021-01-12 ENCOUNTER — Ambulatory Visit (INDEPENDENT_AMBULATORY_CARE_PROVIDER_SITE_OTHER): Payer: BC Managed Care – PPO | Admitting: Ophthalmology

## 2021-01-12 ENCOUNTER — Other Ambulatory Visit: Payer: Self-pay

## 2021-01-12 DIAGNOSIS — H35033 Hypertensive retinopathy, bilateral: Secondary | ICD-10-CM | POA: Diagnosis not present

## 2021-01-12 DIAGNOSIS — E113413 Type 2 diabetes mellitus with severe nonproliferative diabetic retinopathy with macular edema, bilateral: Secondary | ICD-10-CM | POA: Diagnosis not present

## 2021-01-12 DIAGNOSIS — H3581 Retinal edema: Secondary | ICD-10-CM

## 2021-01-12 DIAGNOSIS — I10 Essential (primary) hypertension: Secondary | ICD-10-CM

## 2021-01-12 DIAGNOSIS — H25813 Combined forms of age-related cataract, bilateral: Secondary | ICD-10-CM | POA: Diagnosis not present

## 2021-01-12 MED ORDER — AFLIBERCEPT 2MG/0.05ML IZ SOLN FOR KALEIDOSCOPE
2.0000 mg | INTRAVITREAL | Status: AC | PRN
  Administered 2021-01-12: 2 mg via INTRAVITREAL

## 2021-02-06 ENCOUNTER — Other Ambulatory Visit: Payer: Self-pay | Admitting: Legal Medicine

## 2021-03-05 NOTE — Progress Notes (Signed)
[facility] Note  03/09/2021     CHIEF COMPLAINT Patient presents for Retina Follow Up  HISTORY OF PRESENT ILLNESS: Philip Wilson is a 50 y.o. male who presents to the clinic today for:   HPI     Retina Follow Up   Patient presents with  Diabetic Retinopathy.  In both eyes.  This started 8 weeks ago.  I, the attending physician,  performed the HPI with the patient and updated documentation appropriately.        Comments   Patient here for 8 weeks retina follow up for NPDR with DME OU. Patient states vision about the same. No eye pain.       Last edited by Bernarda Caffey, MD on 03/09/2021 11:36 PM.     Referring physician: Phylliss Blakes, OD [address] [place],  [place] [address]  HISTORICAL INFORMATION:   Selected notes from the MEDICAL RECORD NUMBER Referred by Dr. Ricki Ashbaugh for diabetic retinopathy OU   CURRENT MEDICATIONS: No current outpatient medications on file. (Ophthalmic Drugs)   Current Facility-Administered Medications (Ophthalmic Drugs)  Medication Route   aflibercept (EYLEA) SOLN 2 mg Intravitreal   aflibercept (EYLEA) SOLN 2 mg Intravitreal   aflibercept (EYLEA) SOLN 2 mg Intravitreal   aflibercept (EYLEA) SOLN 2 mg Intravitreal   aflibercept (EYLEA) SOLN 2 mg Intravitreal   aflibercept (EYLEA) SOLN 2 mg Intravitreal   aflibercept (EYLEA) SOLN 2 mg Intravitreal   aflibercept (EYLEA) SOLN 2 mg Intravitreal   aflibercept (EYLEA) SOLN 2 mg Intravitreal   aflibercept (EYLEA) SOLN 2 mg Intravitreal   aflibercept (EYLEA) SOLN 2 mg Intravitreal   Current Outpatient Medications (Other)  Medication Sig   AgaMatrix Ultra-Thin Lancets MISC 200 each by Misc.(Non-Drug; Combo Route) route 4 times daily. One touch delica   aspirin 81 MG EC tablet Take by mouth.   atorvastatin (LIPITOR) 40 MG tablet TAKE 1 TABLET BY MOUTH DAILY   B-D UF III MINI PEN NEEDLES 31G X 5 MM MISC    Blood Glucose Monitoring Suppl (FIFTY50 GLUCOSE  METER 2.0) w/Device KIT 1 each by Other route 4 times daily. Use as instructed one touch ultra   cloNIDine (CATAPRES) 0.1 MG tablet Take 0.1 mg by mouth 2 (two) times daily.    dapsone 25 MG tablet Take 50 mg by mouth daily.   fluconazole (DIFLUCAN) 50 MG tablet    furosemide (LASIX) 40 MG tablet Take 40 mg by mouth daily.   glimepiride (AMARYL) 4 MG tablet Take 4 mg by mouth daily with breakfast.    HYDROcodone-acetaminophen (NORCO/VICODIN) 5-325 MG tablet Take 1 tablet by mouth every 4 (four) hours as needed for moderate pain.   Insulin Glargine (BASAGLAR KWIKPEN) 100 UNIT/ML Inject into the skin.   insulin lispro (HUMALOG) 100 UNIT/ML injection Inject 5 units with breakfast and dinner and 4 units with lunch plus sliding scale as follows: BG 100-150 (1 unit); 151-200 (3 unit); 201-250 (4 unit); 251-300 (5 unit); 251-300 (6 unit); 351-400 (8 unit)   K Phos Mono-Sod Phos Di & Mono (PHOSPHA 250 NEUTRAL) [phone] MG TABS    labetalol (NORMODYNE) 200 MG tablet Take 200 mg by mouth 3 (three) times daily.    lisinopril (PRINIVIL,ZESTRIL) 20 MG tablet Take 20 mg by mouth 2 (two) times a day.    LOKELMA 10 g PACK packet Take 1 packet by mouth daily.   magnesium oxide (MAG-OX) 400 MG tablet Take 1 tablet by mouth 2 (two) times daily.  metFORMIN (GLUCOPHAGE-XR) 500 MG 24 hr tablet Take 500 mg by mouth 2 (two) times daily.   Multiple Vitamin (MULTIVITAMIN WITH MINERALS) TABS tablet Take 1 tablet by mouth daily.   mycophenolate (MYFORTIC) 180 MG EC tablet Take by mouth.   NIFEdipine (PROCARDIA-XL/NIFEDICAL-XL) 30 MG 24 hr tablet Take 30 mg by mouth daily.   NOVOLOG FLEXPEN 100 UNIT/ML FlexPen    oxyCODONE (OXY IR/ROXICODONE) 5 MG immediate release tablet Take 5 mg by mouth every 4 (four) hours.   pantoprazole (PROTONIX) 40 MG tablet Take by mouth.   predniSONE (DELTASONE) 5 MG tablet Take by mouth.   sodium bicarbonate 650 MG tablet Take by mouth.   sulfamethoxazole-trimethoprim (BACTRIM) 400-80 MG  tablet    Tacrolimus ER 1 MG TB24 Take by mouth.   valGANciclovir (VALCYTE) 450 MG tablet Take by mouth.   ACCU-CHEK GUIDE test strip every morning. Use to test fasting blood glucose   metFORMIN (GLUCOPHAGE) 1000 MG tablet Take 1,000 mg by mouth daily.  (Patient not taking: Reported on 09/20/2019)   Multiple Vitamin (MULTIVITAMIN) capsule Take by mouth. (Patient not taking: Reported on 09/20/2019)   Current Facility-Administered Medications (Other)  Medication Route   Bevacizumab (AVASTIN) SOLN 1.25 mg Intravitreal   Bevacizumab (AVASTIN) SOLN 1.25 mg Intravitreal   Bevacizumab (AVASTIN) SOLN 1.25 mg Intravitreal   Bevacizumab (AVASTIN) SOLN 1.25 mg Intravitreal   Bevacizumab (AVASTIN) SOLN 1.25 mg Intravitreal   Bevacizumab (AVASTIN) SOLN 1.25 mg Intravitreal   Bevacizumab (AVASTIN) SOLN 1.25 mg Intravitreal   REVIEW OF SYSTEMS: ROS   Positive for: Neurological, Genitourinary, Endocrine, Eyes Negative for: Constitutional, Gastrointestinal, Skin, Musculoskeletal, HENT, Cardiovascular, Respiratory, Psychiatric, Allergic/Imm, Heme/Lymph Last edited by Theodore Demark, COA on 03/09/2021  2:15 PM.     ALLERGIES No Known Allergies  PAST MEDICAL HISTORY Past Medical History:  Diagnosis Date   Cataract    OU   Chronic kidney disease 05/04/2018   Diabetes 1.5, managed as type 2 (HCC)    Diabetic retinopathy ([place])    NPDR OU   Hypertension    Hypertensive retinopathy    OU   Past Surgical History:  Procedure Laterality Date   AV FISTULA PLACEMENT Left 09/28/2018   Procedure: Creation of Left arm Radiocephalic ARTERIOVENOUS (AV) FISTULA;  Surgeon: Marty Heck, MD;  Location: [facility] OR;  Service: Vascular;  Laterality: Left;   MULTIPLE TOOTH EXTRACTIONS     FAMILY HISTORY Family History  Problem Relation Age of Onset   Heart failure Mother    SOCIAL HISTORY Social History   Tobacco Use   Smoking status: Former   Smokeless tobacco: Never   Tobacco comments:    quit 20  years ago  Vaping Use   Vaping Use: Never used  Substance Use Topics   Alcohol use: Not Currently   Drug use: Never       OPHTHALMIC EXAM:  Base Eye Exam     Visual Acuity (Snellen - Linear)       Right Left   Dist [place] 20/50 -1 20/30 +2   Dist ph [place] 20/30 -2 20/20         Tonometry (Tonopen, 2:12 PM)       Right Left   Pressure 22 18         Pupils       Dark Light Shape React APD   Right 3 2 Round Brisk None   Left 3 2 Round Brisk None         Visual Fields (Counting fingers)  Left Right    Full Full         Extraocular Movement       Right Left    Full, Ortho Full, Ortho         Neuro/Psych     Oriented x3: Yes   Mood/Affect: Normal         Dilation     Both eyes: 1.0% Mydriacyl, 2.5% Phenylephrine @ 2:12 PM           Slit Lamp and Fundus Exam     Slit Lamp Exam       Right Left   Lids/Lashes Dermatochalasis - upper lid, mild Meibomian gland dysfunction Dermatochalasis - upper lid, mild Meibomian gland dysfunction   Conjunctiva/Sclera White and quiet White and quiet   Cornea trace PEE trace PEE   Anterior Chamber Deep and quiet, no cell or flare Deep and quiet, no cell or flare   Iris Round and dilated to 23m Round and dilated to 735m  Lens 1+ Nuclear sclerosis, 2+ Cortical cataract 1+ Nuclear sclerosis, 2+ Cortical cataract   Anterior Vitreous Mild Vitreous syneresis, Posterior vitreous detachment, Weiss ring Mild Vitreous syneresis, old white VH inferiorly         Fundus Exam       Right Left   Disc Pink and Sharp, mild pallor Pink and Sharp   C/D Ratio 0.2 0.3   Macula good foveal reflex, +ERM greatest superior to fovea, persistent cystic changes -- improved Flat, good foveal reflex, scattered MA, cystic changes temporal macula - stably improved, mild ERM   Vessels attenuated, mild tortuousity, mild Copper wiring mild attenuation, mild tortuousity   Periphery Attached, scattered MAs and DBH - mostly posterior, No  RT/RD Attached, rare MA           IMAGING AND PROCEDURES  Imaging and Procedures for _0 @  OCT, Retina - OU - Both Eyes       Right Eye Quality was good. Central Foveal Thickness: 330. Progression has improved. Findings include intraretinal fluid, retinal drusen , intraretinal hyper-reflective material, no SRF, abnormal foveal contour, epiretinal membrane, macular pucker (interval improvement in IRF central and temporal macula; +ERM and macular pucker).   Left Eye Quality was good. Central Foveal Thickness: 249. Progression has been stable. Findings include epiretinal membrane, no SRF, intraretinal hyper-reflective material, normal foveal contour, no IRF (Trace cystic changes improved).   Notes *Images captured and stored on drive  Diagnosis / Impression:  DME OU OD: Interval improvement in IRF central and temporal macula; +ERM and macular pucker OS: trace cystic changes -- improved  Clinical management:  See below  Abbreviations: NFP - Normal foveal profile. CME - cystoid macular edema. PED - pigment epithelial detachment. IRF - intraretinal fluid. SRF - subretinal fluid. EZ - ellipsoid zone. ERM - epiretinal membrane. ORA - outer retinal atrophy. ORT - outer retinal tubulation. SRHM - subretinal hyper-reflective material      Intravitreal Injection, Pharmacologic Agent - OD - Right Eye       Time Out 03/09/2021. 2:56 PM. Confirmed correct patient, procedure, site, and patient consented.   Anesthesia Topical anesthesia was used. Anesthetic medications included Lidocaine 2%, Proparacaine 0.5%.   Procedure Preparation included 5% betadine to ocular surface, eyelid speculum.   Injection: 2 mg aflibercept 2 MG/0.05ML   Route: Intravitreal, Site: Right Eye   NDC: [id number]: [id number] date: 02/01/2022, Waste: 0.05 mL   Post-op Post injection exam found visual acuity of at least counting fingers. The patient  tolerated the procedure well. There  were no complications. The patient received written and verbal post procedure care education. Post injection medications were not given.             ASSESSMENT/PLAN:    ICD-10-CM   1. 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 Retinal edema  H35.81 OCT, Retina - OU - Both Eyes    3. Essential hypertension  I10     4. Hypertensive retinopathy of both eyes  H35.033     5. Combined forms of age-related cataract of both eyes  H25.813      1, 2. Severe Non-proliferative diabetic retinopathy, both eyes  - initial exam showed massive central DME OU and scattered IRH and IRMA  - FA 6.5.19 with patches of capillary nonperfusion; extensive Mas with late leakage OU; no frank NV  - FA 01.02.20, shows improvement in late leaking microaneurysms OU  - S/P IVA OS #1 (06.05.19), #2 (07.08.19), #3 (07.08.19), #4 (09.03.19)  - S/P IVA OD #1 (06.07.19), #2 (07.08.19), #3 (07.08.19)  - S/P IVE OD #1 (09.03.19), #2 (10.03.19), #3 (11.04.19), #4 (12.02.19), #5 (01.02.20), #6 (02.07.20), #7 (03.16.20), #8 (05.20.20), #9 (06.22.20), #10 (07.27.20), #11(09.09.10), # 12 (10.28.20), #13 (11.25.20), #14 (04.06.21) - sample, #15 (05.04.21) - sample, #16 (06.18.21), #17 (07.30.21), #18 (09.10.21), #19 (11.16.21), #20 (1.7.22), #21 (4.12.22), #22 (06.07.22), #23 (8.1.22), #24 (10.11.22)  - S/P IVE OS #1 (10.03.19), #2 (11.04.19), #3 (12.02.19), #4 (01.02.20), #5 (02.07.20), #6 (03.16.20), #7 (05.20.20), #8 (06.22.20),#9 (07.27.20), #10 (09.09.20), #11 (10.28.20), #12 (11.25.20)  - OCT shows OD: Interval improvement in IRF central and temporal macula; +ERM and macular pucker; OS: trace cystic changes -- improved at 8 wks  - BCVA: OD 20/30,  OS 20/20 -- both stable  - recommend IVE OD #25 today, 12.6.22  - will hold off on  injection OS again today -- IRF improving without therapy and BCVA 20/20  - pt in agreement  - RBA of procedure discussed, questions answered  - informed consent obtained and signed  - see procedure note -- tolerated well  - Eylea paperwork and benefits investigation started on 08.05.19 -- approved for 2022  - f/u in 8 weeks -- DFE/OCT/possible injection  3,4. Hypertensive retinopathy OU  - discussed importance of tight BP control  - had some Bps in hypertensive emergency range (200s / 110s)  - BP improved post kidney transplant  - discussed likely contribution to DME  - monitor             - BP management per nephrology, PCP and cardiology  5. Combined form age-related cataract OU-   - The symptoms of cataract, surgical options, and treatments and risks were discussed with patient.  - discussed diagnosis and progression  - not yet visually significant  - monitor for now  Ophthalmic Meds Ordered this visit:  Meds ordered this encounter  Medications   aflibercept (EYLEA) SOLN 2 mg      Return in about 8 weeks (around 05/04/2021) for DFE, OCT, possible injection.  There are no Patient Instructions on file for this visit.  Explained the diagnoses, plan, and follow up with the patient and they expressed understanding.  Patient expressed understanding of the importance of proper follow up care.   This document serves as a record of services personally  performed by Gardiner Sleeper, MD, PhD. It was created on their behalf by Estill Bakes, [place] an ophthalmic technician. The creation of this record is the provider's dictation and/or activities during the visit.    Electronically signed by: Estill Bakes, COT 12.2.22 @ 11:39 PM   This document serves as a record of services personally performed by Gardiner Sleeper, MD, PhD. It was created on their behalf by Leonie Douglas, an ophthalmic technician. The creation of this record is the provider's dictation and/or activities during the visit.     Electronically signed by: Leonie Douglas COA, 03/09/21  11:39 PM  Gardiner Sleeper, M.D., Ph.D. Diseases & Surgery of the Retina and [place] 03/09/2021  I have reviewed the above documentation for accuracy and completeness, and I agree with the above. Gardiner Sleeper, M.D., Ph.D. 03/09/21 11:39 PM   Abbreviations: M myopia (nearsighted); A astigmatism; H hyperopia (farsighted); P presbyopia; Mrx spectacle prescription;  CTL contact lenses; OD right eye; OS left eye; OU both eyes  XT exotropia; ET esotropia; PEK punctate epithelial keratitis; PEE punctate epithelial erosions; DES dry eye syndrome; MGD meibomian gland dysfunction; ATs artificial tears; PFAT's preservative free artificial tears; [place] nuclear sclerotic cataract; PSC posterior subcapsular cataract; ERM epi-retinal membrane; PVD posterior vitreous detachment; RD retinal detachment; DM diabetes mellitus; DR diabetic retinopathy; NPDR non-proliferative diabetic retinopathy; PDR proliferative diabetic retinopathy; CSME clinically significant macular edema; DME diabetic macular edema; dbh dot blot hemorrhages; CWS cotton wool spot; POAG primary open angle glaucoma; C/D cup-to-disc ratio; HVF humphrey visual field; GVF goldmann visual field; OCT optical coherence tomography; IOP intraocular pressure; BRVO Branch retinal vein occlusion; CRVO central retinal vein occlusion; CRAO central retinal artery occlusion; BRAO branch retinal artery occlusion; RT retinal tear; SB scleral buckle; PPV pars plana vitrectomy; VH Vitreous hemorrhage; PRP panretinal laser photocoagulation; IVK intravitreal kenalog; VMT vitreomacular traction; MH Macular hole;  NVD neovascularization of the disc; NVE neovascularization elsewhere; AREDS age related eye disease study; ARMD age related macular degeneration; POAG primary open angle glaucoma; EBMD epithelial/anterior basement membrane dystrophy; ACIOL anterior chamber intraocular lens;  IOL intraocular lens; PCIOL posterior chamber intraocular lens; Phaco/IOL phacoemulsification with intraocular lens placement; [place] photorefractive keratectomy; LASIK laser assisted in situ keratomileusis; HTN hypertension; DM diabetes mellitus; COPD chronic obstructive pulmonary disease

## 2021-03-09 ENCOUNTER — Other Ambulatory Visit: Payer: Self-pay

## 2021-03-09 ENCOUNTER — Ambulatory Visit (INDEPENDENT_AMBULATORY_CARE_PROVIDER_SITE_OTHER): Payer: BC Managed Care – PPO | Admitting: Ophthalmology

## 2021-03-09 ENCOUNTER — Encounter (INDEPENDENT_AMBULATORY_CARE_PROVIDER_SITE_OTHER): Payer: Self-pay | Admitting: Ophthalmology

## 2021-03-09 DIAGNOSIS — E113413 Type 2 diabetes mellitus with severe nonproliferative diabetic retinopathy with macular edema, bilateral: Secondary | ICD-10-CM | POA: Diagnosis not present

## 2021-03-09 DIAGNOSIS — H35033 Hypertensive retinopathy, bilateral: Secondary | ICD-10-CM | POA: Diagnosis not present

## 2021-03-09 DIAGNOSIS — H25813 Combined forms of age-related cataract, bilateral: Secondary | ICD-10-CM | POA: Diagnosis not present

## 2021-03-09 DIAGNOSIS — I10 Essential (primary) hypertension: Secondary | ICD-10-CM

## 2021-03-09 DIAGNOSIS — H3581 Retinal edema: Secondary | ICD-10-CM

## 2021-03-09 MED ORDER — AFLIBERCEPT 2MG/0.05ML IZ SOLN FOR KALEIDOSCOPE
2.0000 mg | INTRAVITREAL | Status: AC | PRN
  Administered 2021-03-09: 2 mg via INTRAVITREAL

## 2021-03-23 ENCOUNTER — Encounter: Payer: Self-pay | Admitting: Legal Medicine

## 2021-03-23 ENCOUNTER — Ambulatory Visit (INDEPENDENT_AMBULATORY_CARE_PROVIDER_SITE_OTHER): Payer: Medicare Other | Admitting: Legal Medicine

## 2021-03-23 VITALS — BP 90/70 | HR 87 | Temp 97.5°F | Resp 16 | Ht 70.0 in | Wt 216.0 lb

## 2021-03-23 DIAGNOSIS — E782 Mixed hyperlipidemia: Secondary | ICD-10-CM

## 2021-03-23 DIAGNOSIS — E088 Diabetes mellitus due to underlying condition with unspecified complications: Secondary | ICD-10-CM

## 2021-03-23 DIAGNOSIS — N2581 Secondary hyperparathyroidism of renal origin: Secondary | ICD-10-CM

## 2021-03-23 DIAGNOSIS — N184 Chronic kidney disease, stage 4 (severe): Secondary | ICD-10-CM

## 2021-03-23 DIAGNOSIS — N289 Disorder of kidney and ureter, unspecified: Secondary | ICD-10-CM

## 2021-03-23 DIAGNOSIS — D849 Immunodeficiency, unspecified: Secondary | ICD-10-CM

## 2021-03-23 DIAGNOSIS — I10 Essential (primary) hypertension: Secondary | ICD-10-CM | POA: Diagnosis not present

## 2021-03-23 DIAGNOSIS — E1121 Type 2 diabetes mellitus with diabetic nephropathy: Secondary | ICD-10-CM | POA: Insufficient documentation

## 2021-03-23 NOTE — Progress Notes (Signed)
Subjective:  Patient ID: Philip Wilson, male    DOB: 07/24/1970  Age: 50 y.o. MRN: [id number]  Chief Complaint  Patient presents with   Diabetes   Hypertension    HPI: chronic visit  Patient present with type 2 diabetes.  Specifically, this is type 2, insulin requiring diabetes, complicated by renal failure.  Compliance with treatment has been good; patient take medicines as directed, maintains diet and exercise regimen, follows up as directed, and is keeping glucose diary.  Date of  diagnosis 2010.  Depression screen has been performed.Tobacco screen nonsmoker. Current medicines for diabetes basaglar., 48 units, novolog sliding scale and metfrmin  Patient is on none for renal protection and aorvastatin for cholesterol control.  Patient performs foot exams daily and last ophthalmologic exam was dr. Clovis Fredrickson.   Renal transplant doing well  Patient presents for follow up of hypertension.  Patient tolerating procardia, labetolol,  amylodipinewell with side effects.  Patient was diagnosed with hypertension 2010 so has been treated for hypertension for 12 years.Patient is working on maintaining diet and exercise regimen and follows up as directed. Complication include renal failure.    Current Outpatient Medications on File Prior to Visit  Medication Sig Dispense Refill   ACCU-CHEK GUIDE test strip every morning. Use to test fasting blood glucose  4   AgaMatrix Ultra-Thin Lancets MISC 200 each by Misc.(Non-Drug; Combo Route) route 4 times daily. One touch delica     amLODipine (NORVASC) 2.5 MG tablet Take 2.5 mg by mouth daily.     aspirin 81 MG EC tablet Take by mouth.     atorvastatin (LIPITOR) 40 MG tablet TAKE 1 TABLET BY MOUTH DAILY (Patient taking differently: Take 40 mg by mouth 3 (three) times a week. M, W, F) 90 tablet 2   BD PEN NEEDLE NANO 2ND GEN 32G X 4 MM MISC 4 (four) times daily. as directed     Blood Glucose Monitoring Suppl (FIFTY50 GLUCOSE METER 2.0) w/Device KIT 1 each by  Other route 4 times daily. Use as instructed one touch ultra     insulin aspart (NOVOLOG) 100 UNIT/ML FlexPen Inject into the skin.     Insulin Glargine (BASAGLAR KWIKPEN) 100 UNIT/ML Inject into the skin.     labetalol (NORMODYNE) 200 MG tablet Take by mouth.     metFORMIN (GLUCOPHAGE-XR) 500 MG 24 hr tablet Take 500 mg by mouth 2 (two) times daily.     mycophenolate (MYFORTIC) 180 MG EC tablet Take by mouth.     NIFEdipine (PROCARDIA-XL/NIFEDICAL-XL) 30 MG 24 hr tablet Take 30 mg by mouth daily.     pantoprazole (PROTONIX) 40 MG tablet Take by mouth.     predniSONE (DELTASONE) 5 MG tablet Take by mouth.     tacrolimus (PROGRAF) 1 MG capsule Take by mouth.     Current Facility-Administered Medications on File Prior to Visit  Medication Dose Route Frequency Provider Last Rate Last Admin   aflibercept (EYLEA) SOLN 2 mg  2 mg Intravitreal  Bernarda Caffey, MD   2 mg at 12/05/17 1138   aflibercept (EYLEA) SOLN 2 mg  2 mg Intravitreal  Bernarda Caffey, MD   2 mg at 01/04/18 1202   aflibercept (EYLEA) SOLN 2 mg  2 mg Intravitreal  Bernarda Caffey, MD   2 mg at 01/04/18 1203   aflibercept (EYLEA) SOLN 2 mg  2 mg Intravitreal  Bernarda Caffey, MD   2 mg at 02/05/18 0930   aflibercept (EYLEA) SOLN 2 mg  2 mg Intravitreal  Bernarda Caffey, MD   2 mg at 02/05/18 0930   aflibercept (EYLEA) SOLN 2 mg  2 mg Intravitreal  Bernarda Caffey, MD   2 mg at 03/05/18 1649   aflibercept (EYLEA) SOLN 2 mg  2 mg Intravitreal  Bernarda Caffey, MD   2 mg at 03/05/18 1649   aflibercept (EYLEA) SOLN 2 mg  2 mg Intravitreal  Bernarda Caffey, MD   2 mg at 04/05/18 2315   aflibercept (EYLEA) SOLN 2 mg  2 mg Intravitreal  Bernarda Caffey, MD   2 mg at 04/05/18 2315   aflibercept (EYLEA) SOLN 2 mg  2 mg Intravitreal  Bernarda Caffey, MD   2 mg at 05/13/18 2254   aflibercept (EYLEA) SOLN 2 mg  2 mg Intravitreal  Bernarda Caffey, MD   2 mg at 05/13/18 2255   Bevacizumab (AVASTIN) SOLN 1.25 mg  1.25 mg Intravitreal  Bernarda Caffey, MD   1.25 mg at  09/06/17 1312   Bevacizumab (AVASTIN) SOLN 1.25 mg  1.25 mg Intravitreal  Bernarda Caffey, MD   1.25 mg at 09/08/17 0933   Bevacizumab (AVASTIN) SOLN 1.25 mg  1.25 mg Intravitreal  Bernarda Caffey, MD   1.25 mg at 10/09/17 1144   Bevacizumab (AVASTIN) SOLN 1.25 mg  1.25 mg Intravitreal  Bernarda Caffey, MD   1.25 mg at 10/09/17 1144   Bevacizumab (AVASTIN) SOLN 1.25 mg  1.25 mg Intravitreal  Bernarda Caffey, MD   1.25 mg at 11/06/17 1105   Bevacizumab (AVASTIN) SOLN 1.25 mg  1.25 mg Intravitreal  Bernarda Caffey, MD   1.25 mg at 11/06/17 1105   Bevacizumab (AVASTIN) SOLN 1.25 mg  1.25 mg Intravitreal  Bernarda Caffey, MD   1.25 mg at 12/05/17 1138   Past Medical History:  Diagnosis Date   Cataract    OU   Chronic kidney disease 05/04/2018   Diabetes 1.5, managed as type 2 ([place])    Diabetic retinopathy ([place])    NPDR OU   Hypertension    Hypertensive retinopathy    OU   Past Surgical History:  Procedure Laterality Date   AV FISTULA PLACEMENT Left 09/28/2018   Procedure: Creation of Left arm Radiocephalic ARTERIOVENOUS (AV) FISTULA;  Surgeon: Marty Heck, MD;  Location: [facility] OR;  Service: Vascular;  Laterality: Left;   MULTIPLE TOOTH EXTRACTIONS      Family History  Problem Relation Age of Onset   Heart failure Mother    Social History   Socioeconomic History   Marital status: Married    Spouse name: Not on file   Number of children: Not on file   Years of education: Not on file   Highest education level: Not on file  Occupational History   Occupation: sales  Tobacco Use   Smoking status: Former   Smokeless tobacco: Never   Tobacco comments:    quit 20 years ago  Vaping Use   Vaping Use: Never used  Substance and Sexual Activity   Alcohol use: Not Currently   Drug use: Never   Sexual activity: Yes    Partners: Female  Other Topics Concern   Not on file  Social History Narrative   Not on file   Social Determinants of Health   Financial Resource Strain: Not on file   Food Insecurity: Not on file  Transportation Needs: Not on file  Physical Activity: Not on file  Stress: Not on file  Social Connections: Not on file    Review of Systems  Constitutional:  Negative  for chills, fatigue, fever and unexpected weight change.  HENT:  Negative for congestion, ear pain, sinus pain and sore throat.   Respiratory:  Negative for cough and shortness of breath.   Cardiovascular:  Negative for chest pain and palpitations.  Gastrointestinal:  Negative for abdominal pain, blood in stool, constipation, diarrhea, nausea and vomiting.  Endocrine: Negative for polydipsia.  Genitourinary:  Negative for dysuria.  Musculoskeletal:  Negative for back pain.  Skin:  Negative for rash.  Neurological:  Negative for headaches.  Psychiatric/Behavioral: Negative.      Objective:  BP 90/70    Pulse 87    Temp (!) 97.5 F (36.4 C)    Resp 16    Ht '5\' 10"'  (1.778 m)    Wt 216 lb (98 kg)    SpO2 97%    BMI 30.99 kg/m   BP/Weight 03/23/2021 12/25/2018 6/56/8127  Systolic BP 90 517 001  Diastolic BP 70 80 85  Wt. (Lbs) 216 194 192  BMI 30.99 27.84 27.55    Physical Exam Vitals reviewed.  Constitutional:      Appearance: Normal appearance. He is obese.  HENT:     Head: Normocephalic.     Right Ear: Tympanic membrane normal.     Left Ear: Tympanic membrane normal.     Nose: Nose normal.     Mouth/Throat:     Mouth: Mucous membranes are moist.     Pharynx: Oropharynx is clear.  Eyes:     Extraocular Movements: Extraocular movements intact.     Conjunctiva/sclera: Conjunctivae normal.     Pupils: Pupils are equal, round, and reactive to light.  Cardiovascular:     Rate and Rhythm: Normal rate and regular rhythm.     Pulses: Normal pulses.     Heart sounds: Normal heart sounds. No murmur heard.   No gallop.  Pulmonary:     Effort: Pulmonary effort is normal. No respiratory distress.     Breath sounds: No wheezing.  Abdominal:     General: Abdomen is flat. Bowel  sounds are normal. There is no distension.     Palpations: Abdomen is soft.     Tenderness: There is no abdominal tenderness.  Musculoskeletal:        General: Normal range of motion.     Cervical back: Normal range of motion and neck supple.     Right lower leg: No edema.     Left lower leg: No edema.  Skin:    General: Skin is warm.     Capillary Refill: Capillary refill takes less than 2 seconds.  Neurological:     General: No focal deficit present.     Mental Status: He is alert and oriented to person, place, and time. Mental status is at baseline.  Psychiatric:        Mood and Affect: Mood normal.        Thought Content: Thought content normal.    Diabetic Foot Exam - Simple   No data filed      Lab Results  Component Value Date   WBC 8.6 05/29/2018   HGB 10.2 (L) 09/28/2018   HCT 30.0 (L) 09/28/2018   PLT 262 05/29/2018   GLUCOSE 71 09/28/2018   NA 140 09/28/2018   K 3.1 (L) 09/28/2018   INR 1.0 05/29/2018      Assessment & Plan:   Problem List Items Addressed This Visit       Cardiovascular and Mediastinum   Essential hypertension - Primary  Relevant Medications   amLODipine (NORVASC) 2.5 MG tablet   labetalol (NORMODYNE) 200 MG tablet   Other Relevant Orders   Comprehensive metabolic panel   CBC with Differential/Platelet An individual hypertension care plan was established and reinforced today.  The patient's status was assessed using clinical findings on exam and labs or diagnostic tests. The patient's success at meeting treatment goals on disease specific evidence-based guidelines and found to be well controlled. SELF MANAGEMENT: The patient and I together assessed ways to personally work towards obtaining the recommended goals. RECOMMENDATIONS: avoid decongestants found in common cold remedies, decrease consumption of alcohol, perform routine monitoring of BP with home BP cuff, exercise, reduction of dietary salt, take medicines as prescribed, try not  to miss doses and quit smoking.  Regular exercise and maintaining a healthy weight is needed.  Stress reduction may help. A CLINICAL SUMMARY including written plan identify barriers to care unique to individual due to social or financial issues.  We attempt to mutually creat solutions for individual and family understanding.      Endocrine   Diabetes mellitus due to underlying condition with unspecified complications (HCC)   Relevant Medications   insulin aspart (NOVOLOG) 100 UNIT/ML FlexPen   Insulin Glargine (BASAGLAR KWIKPEN) 100 UNIT/ML   Other Relevant Orders   Hemoglobin A1c An individual care plan for diabetes was established and reinforced today.  The patient's status was assessed using clinical findings on exam, labs and diagnostic testing. Patient success at meeting goals based on disease specific evidence-based guidelines and found to be good controlled. Medications were assessed and patient's understanding of the medical issues , including barriers were assessed. Recommend adherence to a diabetic diet, a graduated exercise program, HgbA1c level is checked quarterly, and urine microalbumin performed yearly .  Annual mono-filament sensation testing performed. Lower blood pressure and control hyperlipidemia is important. Get annual eye exams and annual flu shots and smoking cessation discussed.  Self management goals were discussed.     Diabetic glomerulopathy (HCC)   Relevant Medications   insulin aspart (NOVOLOG) 100 UNIT/ML FlexPen   Insulin Glargine (BASAGLAR KWIKPEN) 100 UNIT/ML An individual care plan for diabetes was established and reinforced today.  The patient's status was assessed using clinical findings on exam, labs and diagnostic testing. Patient success at meeting goals based on disease specific evidence-based guidelines and found to be good controlled. Medications were assessed and patient's understanding of the medical issues , including barriers were assessed. Recommend  adherence to a diabetic diet, a graduated exercise program, HgbA1c level is checked quarterly, and urine microalbumin performed yearly .  Annual mono-filament sensation testing performed. Lower blood pressure and control hyperlipidemia is important. Get annual eye exams and annual flu shots and smoking cessation discussed.  Self management goals were discussed.      Genitourinary   Renal insufficiency Patient is status post renal transplant and under the care of [facility].   Other Visit Diagnoses     Mixed hyperlipidemia       Relevant Medications   amLODipine (NORVASC) 2.5 MG tablet   labetalol (NORMODYNE) 200 MG tablet   Other Relevant Orders   Lipid panel AN INDIVIDUAL CARE PLAN for hyperlipidemia/ cholesterol was established and reinforced today.  The patient's status was assessed using clinical findings on exam, lab and other diagnostic tests. The patient's disease status was assessed based on evidence-based guidelines and found to be well controlled. MEDICATIONS were reviewed. SELF MANAGEMENT GOALS have been discussed and patient's success at attaining the goal of  low cholesterol was assessed. RECOMMENDATION given include regular exercise 3 days a week and low cholesterol/low fat diet. CLINICAL SUMMARY including written plan to identify barriers unique to the patient due to social or economic  reasons was discussed.   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 30 plus minutes with visit and reviewing medical center records   Orders Placed This Encounter  Procedures   Comprehensive metabolic panel   Hemoglobin A1c   Lipid panel   CBC with Differential/Platelet     Follow-up: Return in about 4 months (around 07/22/2021) for fasting.  An After Visit Summary was printed and given to the patient.  Reinaldo Meeker, MD [facility] [phone]

## 2021-03-24 LAB — CBC WITH DIFFERENTIAL/PLATELET
Basophils Absolute: 0 10*3/uL (ref 0.0–0.2)
Basos: 0 %
EOS (ABSOLUTE): 0 10*3/uL (ref 0.0–0.4)
Eos: 1 %
Hematocrit: 42.8 % (ref 37.5–51.0)
Hemoglobin: 14.7 g/dL (ref 13.0–17.7)
Immature Grans (Abs): 0 10*3/uL (ref 0.0–0.1)
Immature Granulocytes: 0 %
Lymphocytes Absolute: 0.6 10*3/uL — ABNORMAL LOW (ref 0.7–3.1)
Lymphs: 16 %
MCH: 29.1 pg (ref 26.6–33.0)
MCHC: 34.3 g/dL (ref 31.5–35.7)
MCV: 85 fL (ref 79–97)
Monocytes Absolute: 0.5 10*3/uL (ref 0.1–0.9)
Monocytes: 13 %
Neutrophils Absolute: 2.5 10*3/uL (ref 1.4–7.0)
Neutrophils: 70 %
Platelets: 137 10*3/uL — ABNORMAL LOW (ref 150–450)
RBC: 5.06 x10E6/uL (ref 4.14–5.80)
RDW: 12.1 % (ref 11.6–15.4)
WBC: 3.7 10*3/uL (ref 3.4–10.8)

## 2021-03-24 LAB — LIPID PANEL
Chol/HDL Ratio: 3.4 ratio (ref 0.0–5.0)
Cholesterol, Total: 130 mg/dL (ref 100–199)
HDL: 38 mg/dL — ABNORMAL LOW (ref >39)
LDL Chol Calc (NIH): 63 mg/dL (ref 0–99)
Triglycerides: 170 mg/dL — ABNORMAL HIGH (ref 0–149)
VLDL Cholesterol Cal: 29 mg/dL (ref 5–40)

## 2021-03-24 LAB — COMPREHENSIVE METABOLIC PANEL
ALT: 44 IU/L (ref 0–44)
AST: 44 IU/L — ABNORMAL HIGH (ref 0–40)
Albumin/Globulin Ratio: 1.8 (ref 1.2–2.2)
Albumin: 4.5 g/dL (ref 3.8–4.9)
Alkaline Phosphatase: 95 IU/L (ref 44–121)
BUN/Creatinine Ratio: 15 (ref 9–20)
BUN: 23 mg/dL (ref 6–24)
Bilirubin Total: 1.5 mg/dL — ABNORMAL HIGH (ref 0.0–1.2)
CO2: 20 mmol/L (ref 20–29)
Calcium: 9.6 mg/dL (ref 8.7–10.2)
Chloride: 101 mmol/L (ref 96–106)
Creatinine, Ser: 1.53 mg/dL — ABNORMAL HIGH (ref 0.76–1.27)
Globulin, Total: 2.5 g/dL (ref 1.5–4.5)
Glucose: 218 mg/dL — ABNORMAL HIGH (ref 70–99)
Potassium: 4.9 mmol/L (ref 3.5–5.2)
Sodium: 136 mmol/L (ref 134–144)
Total Protein: 7 g/dL (ref 6.0–8.5)
eGFR: 54 mL/min/{1.73_m2} — ABNORMAL LOW (ref >59)

## 2021-03-24 LAB — HEMOGLOBIN A1C
Est. average glucose Bld gHb Est-mCnc: 160 mg/dL
Hgb A1c MFr Bld: 7.2 % — ABNORMAL HIGH (ref 4.8–5.6)

## 2021-03-24 LAB — CARDIOVASCULAR RISK ASSESSMENT

## 2021-03-24 NOTE — Progress Notes (Signed)
Glucose 218, kidney tests stage 3a, one liver test elevated, A1c 7.2, suggest kerendia for kidneys, triglycerides high 170, cbc normal lp

## 2021-04-03 ENCOUNTER — Other Ambulatory Visit: Payer: Self-pay | Admitting: Legal Medicine

## 2021-04-03 DIAGNOSIS — K2100 Gastro-esophageal reflux disease with esophagitis, without bleeding: Secondary | ICD-10-CM

## 2021-04-03 DIAGNOSIS — E088 Diabetes mellitus due to underlying condition with unspecified complications: Secondary | ICD-10-CM

## 2021-04-25 ENCOUNTER — Other Ambulatory Visit: Payer: Self-pay | Admitting: Legal Medicine

## 2021-04-25 DIAGNOSIS — E119 Type 2 diabetes mellitus without complications: Secondary | ICD-10-CM

## 2021-05-03 NOTE — Progress Notes (Signed)
[facility] Note  05/04/2021     CHIEF COMPLAINT Patient presents for Retina Follow Up  HISTORY OF PRESENT ILLNESS: Philip Wilson is a 51 y.o. male who presents to the clinic today for:   HPI     Retina Follow Up   Patient presents with  Diabetic Retinopathy.  In both eyes.  Duration of 8 weeks.  Since onset it is stable.  I, the attending physician,  performed the HPI with the patient and updated documentation appropriately.        Comments   8 week follow up NPDR OU- No new problems. Vision appears stable OU.  BS 210 yesterday.  He only checks BS when he thinks it is off.  A1C 7.2      Last edited by Bernarda Caffey, MD on 05/07/2021  2:07 AM.     Referring physician: Phylliss Blakes, OD [address] [place],  [place] [address]  HISTORICAL INFORMATION:   Selected notes from the MEDICAL RECORD NUMBER Referred by Dr. Ricki Folz for diabetic retinopathy OU   CURRENT MEDICATIONS: No current outpatient medications on file. (Ophthalmic Drugs)   Current Facility-Administered Medications (Ophthalmic Drugs)  Medication Route   aflibercept (EYLEA) SOLN 2 mg Intravitreal   aflibercept (EYLEA) SOLN 2 mg Intravitreal   aflibercept (EYLEA) SOLN 2 mg Intravitreal   aflibercept (EYLEA) SOLN 2 mg Intravitreal   aflibercept (EYLEA) SOLN 2 mg Intravitreal   aflibercept (EYLEA) SOLN 2 mg Intravitreal   aflibercept (EYLEA) SOLN 2 mg Intravitreal   aflibercept (EYLEA) SOLN 2 mg Intravitreal   aflibercept (EYLEA) SOLN 2 mg Intravitreal   aflibercept (EYLEA) SOLN 2 mg Intravitreal   aflibercept (EYLEA) SOLN 2 mg Intravitreal   Current Outpatient Medications (Other)  Medication Sig   amLODipine (NORVASC) 2.5 MG tablet Take 2.5 mg by mouth daily.   aspirin 81 MG EC tablet Take by mouth.   atorvastatin (LIPITOR) 40 MG tablet TAKE 1 TABLET BY MOUTH DAILY (Patient taking differently: Take 40 mg by mouth 3 (three) times a week. M, W, F)   insulin aspart  (NOVOLOG) 100 UNIT/ML FlexPen Inject into the skin.   Insulin Glargine (BASAGLAR KWIKPEN) 100 UNIT/ML INJECT 38 UNITS UNDER SKIN EVERY NIGHT AT BEDTIME   labetalol (NORMODYNE) 200 MG tablet Take by mouth.   metFORMIN (GLUCOPHAGE-XR) 500 MG 24 hr tablet TAKE 2 TABLETS BY MOUTH EVERY DAY   mycophenolate (MYFORTIC) 180 MG EC tablet Take by mouth.   NIFEdipine (PROCARDIA-XL/NIFEDICAL-XL) 30 MG 24 hr tablet Take 30 mg by mouth daily.   pantoprazole (PROTONIX) 40 MG tablet TAKE 1 TABLET(40 MG) BY MOUTH DAILY   tacrolimus (PROGRAF) 1 MG capsule Take by mouth.   ACCU-CHEK GUIDE test strip TEST THREE TIMES DAILY   AgaMatrix Ultra-Thin Lancets MISC 200 each by Misc.(Non-Drug; Combo Route) route 4 times daily. One touch delica   BD PEN NEEDLE NANO 2ND GEN 32G X 4 MM MISC 4 (four) times daily. as directed   Blood Glucose Monitoring Suppl (FIFTY50 GLUCOSE METER 2.0) w/Device KIT 1 each by Other route 4 times daily. Use as instructed one touch ultra   predniSONE (DELTASONE) 5 MG tablet Take by mouth. (Patient not taking: Reported on 05/04/2021)   Current Facility-Administered Medications (Other)  Medication Route   Bevacizumab (AVASTIN) SOLN 1.25 mg Intravitreal   Bevacizumab (AVASTIN) SOLN 1.25 mg Intravitreal   Bevacizumab (AVASTIN) SOLN 1.25 mg Intravitreal   Bevacizumab (AVASTIN) SOLN 1.25 mg Intravitreal   Bevacizumab (AVASTIN)  SOLN 1.25 mg Intravitreal   Bevacizumab (AVASTIN) SOLN 1.25 mg Intravitreal   Bevacizumab (AVASTIN) SOLN 1.25 mg Intravitreal   REVIEW OF SYSTEMS: ROS   Positive for: Neurological, Genitourinary, Endocrine, Eyes Negative for: Constitutional, Gastrointestinal, Skin, Musculoskeletal, HENT, Cardiovascular, Respiratory, Psychiatric, Allergic/Imm, Heme/Lymph Last edited by Leonie Douglas, COA on 05/04/2021  2:05 PM.     ALLERGIES No Known Allergies  PAST MEDICAL HISTORY Past Medical History:  Diagnosis Date   Cataract    OU   Chronic kidney disease 05/04/2018   Diabetes  1.5, managed as type 2 (HCC)    Diabetic retinopathy ([place])    NPDR OU   Hypertension    Hypertensive retinopathy    OU   Past Surgical History:  Procedure Laterality Date   AV FISTULA PLACEMENT Left 09/28/2018   Procedure: Creation of Left arm Radiocephalic ARTERIOVENOUS (AV) FISTULA;  Surgeon: Marty Heck, MD;  Location: [facility];  Service: Vascular;  Laterality: Left;   MULTIPLE TOOTH EXTRACTIONS     FAMILY HISTORY Family History  Problem Relation Age of Onset   Heart failure Mother    SOCIAL HISTORY Social History   Tobacco Use   Smoking status: Former   Smokeless tobacco: Never   Tobacco comments:    quit 20 years ago  Vaping Use   Vaping Use: Never used  Substance Use Topics   Alcohol use: Not Currently   Drug use: Never       OPHTHALMIC EXAM:  Base Eye Exam     Visual Acuity (Snellen - Linear)       Right Left   Dist [place] 20/50 20/40   Dist ph [place] 20/25 +2 20/30 +1         Tonometry (Tonopen, 2:19 PM)       Right Left   Pressure 23 21         Pupils       Dark Light Shape React APD   Right 3 2 Round Brisk None   Left 3 2 Round Brisk None         Visual Fields (Counting fingers)       Left Right    Full Full         Extraocular Movement       Right Left    Full Full         Neuro/Psych     Oriented x3: Yes   Mood/Affect: Normal         Dilation     Both eyes: 1.0% Mydriacyl, 2.5% Phenylephrine @ 2:19 PM           Slit Lamp and Fundus Exam     Slit Lamp Exam       Right Left   Lids/Lashes Dermatochalasis - upper lid, mild Meibomian gland dysfunction Dermatochalasis - upper lid, mild Meibomian gland dysfunction   Conjunctiva/Sclera White and quiet White and quiet   Cornea trace PEE trace PEE   Anterior Chamber Deep and quiet, no cell or flare Deep and quiet, no cell or flare   Iris Round and dilated to 8m Round and dilated to 764m  Lens 1+ Nuclear sclerosis, 2+ Cortical cataract 1+ Nuclear sclerosis, 2+  Cortical cataract   Anterior Vitreous Mild Vitreous syneresis, Posterior vitreous detachment, Weiss ring Mild Vitreous syneresis, old white VH inferiorly         Fundus Exam       Right Left   Disc Pink and Sharp, mild pallor Pink and Sharp  C/D Ratio 0.2 0.3   Macula good foveal reflex, +ERM greatest superior to fovea, persistent cystic changes -- slightly improved Flat, good foveal reflex, scattered MA stably improved, mild ERM   Vessels attenuated, mild tortuousity, mild Copper wiring mild attenuation, mild tortuousity   Periphery Attached, scattered MAs and DBH - mostly posterior, No RT/RD Attached, rare MA           Refraction     Manifest Refraction       Sphere Cylinder Dist VA   Right      Left -1.50 Sphere 20/20-2           IMAGING AND PROCEDURES  Imaging and Procedures for '@TODAY' @  OCT, Retina - OU - Both Eyes       Right Eye Quality was good. Central Foveal Thickness: 315. Progression has improved. Findings include intraretinal fluid, retinal drusen , intraretinal hyper-reflective material, no SRF, abnormal foveal contour, epiretinal membrane, macular pucker (Mild interval improvement in IRF central and temporal macula; +ERM and macular pucker).   Left Eye Quality was good. Central Foveal Thickness: 247. Progression has been stable. Findings include epiretinal membrane, no SRF, intraretinal hyper-reflective material, normal foveal contour, no IRF (Trace cystic changes stably improved).   Notes *Images captured and stored on drive  Diagnosis / Impression:  DME OU OD: Mild interval improvement in IRF central and temporal macula; +ERM and macular pucker OS: Trace cystic changes -- stably improved  Clinical management:  See below  Abbreviations: NFP - Normal foveal profile. CME - cystoid macular edema. PED - pigment epithelial detachment. IRF - intraretinal fluid. SRF - subretinal fluid. EZ - ellipsoid zone. ERM - epiretinal membrane. ORA - outer  retinal atrophy. ORT - outer retinal tubulation. SRHM - subretinal hyper-reflective material      Intravitreal Injection, Pharmacologic Agent - OD - Right Eye       Time Out 05/04/2021. 3:29 PM. Confirmed correct patient, procedure, site, and patient consented.   Anesthesia Topical anesthesia was used. Anesthetic medications included Lidocaine 2%, Proparacaine 0.5%.   Procedure Preparation included 5% betadine to ocular surface, eyelid speculum. A (32g) needle was used.   Injection: 2 mg aflibercept 2 MG/0.05ML   Route: Intravitreal, Site: Right Eye   NDC: [id number], Lot: [id number], Expiration date: 03/03/2022, Waste: 0.05 mL   Post-op Post injection exam found visual acuity of at least counting fingers. The patient tolerated the procedure well. There were no complications. The patient received written and verbal post procedure care education. Post injection medications were not given.            ASSESSMENT/PLAN:    ICD-10-CM   1. 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 Essential hypertension  I10     3. Hypertensive retinopathy of both eyes  H35.033     4. Combined forms of age-related cataract of both eyes  H25.813      1. Severe Non-proliferative diabetic retinopathy, both eyes  - initial exam showed massive central DME OU and scattered IRH and IRMA  - FA 6.5.19 with patches of capillary nonperfusion; extensive Mas with late leakage OU; no frank NV  - FA 01.02.20, shows improvement in late leaking microaneurysms OU  - S/P IVA OS #1 (06.05.19), #2 (07.08.19), #3 (07.08.19), #4 (  09.03.19)  - S/P IVA OD #1 (06.07.19), #2 (07.08.19), #3 (07.08.19)  - S/P IVE OD #1 (09.03.19), #2 (10.03.19), #3 (11.04.19), #4 (12.02.19), #5 (01.02.20), #6  (02.07.20), #7 (03.16.20), #8 (05.20.20), #9 (06.22.20), #10 (07.27.20), #11(09.09.10), # 12 (10.28.20), #13 (11.25.20), #14 (04.06.21) - sample, #15 (05.04.21) - sample, #16 (06.18.21), #17 (07.30.21), #18 (09.10.21), #19 (11.16.21), #20 (1.7.22), #21 (4.12.22), #22 (06.07.22), #23 (8.1.22), #24 (10.11.22), #25 (12.6.22)  - S/P IVE OS #1 (10.03.19), #2 (11.04.19), #3 (12.02.19), #4 (01.02.20), #5 (02.07.20), #6 (03.16.20), #7 (05.20.20), #8 (06.22.20),#9 (07.27.20), #10 (09.09.20), #11 (10.28.20), #12 (11.25.20)  - OCT shows OD: Mild interval improvement in IRF central and temporal macula; +ERM and macular pucker; OS: trace cystic changes - stably improved at 8 wks  - BCVA: OD 20/25 -- improved,  OS 20/20 -- stable  - recommend IVE OD #26 today, 1.31.23  - will hold off on injection OS again today -- IRF improving without therapy and BCVA 20/20  - pt in agreement  - RBA of procedure discussed, questions answered  - informed consent obtained and signed  - see procedure note -- tolerated well  - Eylea paperwork and benefits investigation started on 08.05.19 -- approved for 2022  - f/u in 8 weeks -- DFE/OCT/possible injection  2,3. Hypertensive retinopathy OU  - discussed importance of tight BP control  - had some Bps in hypertensive emergency range (200s / 110s)  - BP improved post kidney transplant  - discussed likely contribution to DME  - monitor             - BP management per nephrology, PCP and cardiology  4. Combined form age-related cataract OU-   - The symptoms of cataract, surgical options, and treatments and risks were discussed with patient.  - discussed diagnosis and progression  - not yet visually significant  - monitor for now  Ophthalmic Meds Ordered this visit:  Meds ordered this encounter  Medications   aflibercept (EYLEA) SOLN 2 mg      Return in about 8 weeks (around 06/29/2021) for Severe NPDR OU w/DFE/OCT/likely IVE OD.  There are no Patient Instructions on  file for this visit.  Explained the diagnoses, plan, and follow up with the patient and they expressed understanding.  Patient expressed understanding of the importance of proper follow up care.   This document serves as a record of services personally performed by Gardiner Sleeper, MD, PhD. It was created on their behalf by Estill Bakes, COT an ophthalmic technician. The creation of this record is the provider's dictation and/or activities during the visit.    Electronically signed by: Estill Bakes, COT 1.30.23 @ 2:12 AM   This document serves as a record of services personally performed by Gardiner Sleeper, MD, PhD. It was created on their behalf by Estill Bakes, COT an ophthalmic technician. The creation of this record is the provider's dictation and/or activities during the visit.    Electronically signed by: Estill Bakes, COT 1.31.23 @ 2:12 AM   Gardiner Sleeper, M.D., Ph.D. Diseases & Surgery of the Retina and [place] 1.31.23  I have reviewed the above documentation for accuracy and completeness, and I agree with the above. Gardiner Sleeper, M.D., Ph.D. 05/07/21 2:12 AM   Abbreviations: M myopia (nearsighted); A astigmatism; H hyperopia (farsighted); P presbyopia; Mrx spectacle prescription;  CTL contact lenses; OD right eye; OS left eye; OU both eyes  XT exotropia; ET esotropia; PEK punctate epithelial keratitis; PEE  punctate epithelial erosions; DES dry eye syndrome; MGD meibomian gland dysfunction; ATs artificial tears; PFAT's preservative free artificial tears; [place] nuclear sclerotic cataract; PSC posterior subcapsular cataract; ERM epi-retinal membrane; PVD posterior vitreous detachment; RD retinal detachment; DM diabetes mellitus; DR diabetic retinopathy; NPDR non-proliferative diabetic retinopathy; PDR proliferative diabetic retinopathy; CSME clinically significant macular edema; DME diabetic macular edema; dbh dot blot hemorrhages; CWS cotton wool  spot; POAG primary open angle glaucoma; C/D cup-to-disc ratio; HVF humphrey visual field; GVF goldmann visual field; OCT optical coherence tomography; IOP intraocular pressure; BRVO Branch retinal vein occlusion; CRVO central retinal vein occlusion; CRAO central retinal artery occlusion; BRAO branch retinal artery occlusion; RT retinal tear; SB scleral buckle; PPV pars plana vitrectomy; VH Vitreous hemorrhage; PRP panretinal laser photocoagulation; IVK intravitreal kenalog; VMT vitreomacular traction; MH Macular hole;  NVD neovascularization of the disc; NVE neovascularization elsewhere; AREDS age related eye disease study; ARMD age related macular degeneration; POAG primary open angle glaucoma; EBMD epithelial/anterior basement membrane dystrophy; ACIOL anterior chamber intraocular lens; IOL intraocular lens; PCIOL posterior chamber intraocular lens; Phaco/IOL phacoemulsification with intraocular lens placement; [place] photorefractive keratectomy; LASIK laser assisted in situ keratomileusis; HTN hypertension; DM diabetes mellitus; COPD chronic obstructive pulmonary disease

## 2021-05-04 ENCOUNTER — Other Ambulatory Visit: Payer: Self-pay

## 2021-05-04 ENCOUNTER — Ambulatory Visit (INDEPENDENT_AMBULATORY_CARE_PROVIDER_SITE_OTHER): Payer: BC Managed Care – PPO | Admitting: Ophthalmology

## 2021-05-04 ENCOUNTER — Encounter (INDEPENDENT_AMBULATORY_CARE_PROVIDER_SITE_OTHER): Payer: Self-pay | Admitting: Ophthalmology

## 2021-05-04 DIAGNOSIS — E113413 Type 2 diabetes mellitus with severe nonproliferative diabetic retinopathy with macular edema, bilateral: Secondary | ICD-10-CM | POA: Diagnosis not present

## 2021-05-04 DIAGNOSIS — I10 Essential (primary) hypertension: Secondary | ICD-10-CM

## 2021-05-04 DIAGNOSIS — H35033 Hypertensive retinopathy, bilateral: Secondary | ICD-10-CM | POA: Diagnosis not present

## 2021-05-04 DIAGNOSIS — H25813 Combined forms of age-related cataract, bilateral: Secondary | ICD-10-CM

## 2021-05-07 ENCOUNTER — Encounter (INDEPENDENT_AMBULATORY_CARE_PROVIDER_SITE_OTHER): Payer: Self-pay | Admitting: Ophthalmology

## 2021-05-07 MED ORDER — AFLIBERCEPT 2MG/0.05ML IZ SOLN FOR KALEIDOSCOPE
2.0000 mg | INTRAVITREAL | Status: AC | PRN
  Administered 2021-05-07: 2 mg via INTRAVITREAL

## 2021-06-07 ENCOUNTER — Other Ambulatory Visit: Payer: Self-pay

## 2021-06-07 DIAGNOSIS — E088 Diabetes mellitus due to underlying condition with unspecified complications: Secondary | ICD-10-CM

## 2021-06-07 MED ORDER — INSULIN ASPART 100 UNIT/ML FLEXPEN: PEN_INJECTOR | SUBCUTANEOUS | 6 refills | Status: DC

## 2021-06-08 ENCOUNTER — Other Ambulatory Visit: Payer: Self-pay

## 2021-06-08 DIAGNOSIS — E088 Diabetes mellitus due to underlying condition with unspecified complications: Secondary | ICD-10-CM

## 2021-06-08 MED ORDER — INSULIN ASPART 100 UNIT/ML FLEXPEN: 20.0000 [IU] | PEN_INJECTOR | Freq: Three times a day (TID) | SUBCUTANEOUS | 6 refills | Status: DC

## 2021-06-24 NOTE — Progress Notes (Shared)
?[facility] Note ? ?06/30/2021 ? ?  ? ?CHIEF COMPLAINT ?Patient presents for No chief complaint on file. ? ?HISTORY OF PRESENT ILLNESS: ?Philip Wilson is a 51 y.o. male who presents to the clinic today for:  ? ? ? ?Referring physician: ?Lillard Anes, MD ?[address] ?[address] ?[place],  [place] [address] ? ?HISTORICAL INFORMATION:  ? ?Selected notes from the [facility] ?Referred by Dr. Ricki Credit for diabetic retinopathy OU  ? ?CURRENT MEDICATIONS: ?No current outpatient medications on file. (Ophthalmic Drugs)  ? ?Current Facility-Administered Medications (Ophthalmic Drugs)  ?Medication Route  ? aflibercept (EYLEA) SOLN 2 mg Intravitreal  ? aflibercept (EYLEA) SOLN 2 mg Intravitreal  ? aflibercept (EYLEA) SOLN 2 mg Intravitreal  ? aflibercept (EYLEA) SOLN 2 mg Intravitreal  ? aflibercept (EYLEA) SOLN 2 mg Intravitreal  ? aflibercept (EYLEA) SOLN 2 mg Intravitreal  ? aflibercept (EYLEA) SOLN 2 mg Intravitreal  ? aflibercept (EYLEA) SOLN 2 mg Intravitreal  ? aflibercept (EYLEA) SOLN 2 mg Intravitreal  ? aflibercept (EYLEA) SOLN 2 mg Intravitreal  ? aflibercept (EYLEA) SOLN 2 mg Intravitreal  ? ?Current Outpatient Medications (Other)  ?Medication Sig  ? ACCU-CHEK GUIDE test strip TEST THREE TIMES DAILY  ? AgaMatrix Ultra-Thin Lancets MISC 200 each by Misc.(Non-Drug; Combo Route) route 4 times daily. One touch delica  ? amLODipine (NORVASC) 2.5 MG tablet Take 2.5 mg by mouth daily.  ? aspirin 81 MG EC tablet Take by mouth.  ? atorvastatin (LIPITOR) 40 MG tablet TAKE 1 TABLET BY MOUTH DAILY (Patient taking differently: Take 40 mg by mouth 3 (three) times a week. M, W, F)  ? BD PEN NEEDLE NANO 2ND GEN 32G X 4 MM MISC 4 (four) times daily. as directed  ? Blood Glucose Monitoring Suppl (FIFTY50 GLUCOSE METER 2.0) w/Device KIT 1 each by Other route 4 times daily. Use as instructed one touch ultra  ? insulin aspart (NOVOLOG) 100 UNIT/ML FlexPen Inject 20 Units into the  skin 3 (three) times daily with meals. Modified dose before meals based on sliding scale. Max 30 units.  ? Insulin Glargine (BASAGLAR KWIKPEN) 100 UNIT/ML INJECT 38 UNITS UNDER SKIN EVERY NIGHT AT BEDTIME  ? labetalol (NORMODYNE) 200 MG tablet Take by mouth.  ? metFORMIN (GLUCOPHAGE-XR) 500 MG 24 hr tablet TAKE 2 TABLETS BY MOUTH EVERY DAY  ? mycophenolate (MYFORTIC) 180 MG EC tablet Take by mouth.  ? NIFEdipine (PROCARDIA-XL/NIFEDICAL-XL) 30 MG 24 hr tablet Take 30 mg by mouth daily.  ? pantoprazole (PROTONIX) 40 MG tablet TAKE 1 TABLET(40 MG) BY MOUTH DAILY  ? predniSONE (DELTASONE) 5 MG tablet Take by mouth. (Patient not taking: Reported on 05/04/2021)  ? tacrolimus (PROGRAF) 1 MG capsule Take by mouth.  ? ?Current Facility-Administered Medications (Other)  ?Medication Route  ? Bevacizumab (AVASTIN) SOLN 1.25 mg Intravitreal  ? Bevacizumab (AVASTIN) SOLN 1.25 mg Intravitreal  ? Bevacizumab (AVASTIN) SOLN 1.25 mg Intravitreal  ? Bevacizumab (AVASTIN) SOLN 1.25 mg Intravitreal  ? Bevacizumab (AVASTIN) SOLN 1.25 mg Intravitreal  ? Bevacizumab (AVASTIN) SOLN 1.25 mg Intravitreal  ? Bevacizumab (AVASTIN) SOLN 1.25 mg Intravitreal  ? ?REVIEW OF SYSTEMS: ? ? ?ALLERGIES ?No Known Allergies ? ?PAST MEDICAL HISTORY ?Past Medical History:  ?Diagnosis Date  ? Cataract   ? OU  ? Chronic kidney disease 05/04/2018  ? Diabetes 1.5, managed as type 2 ([place])   ? Diabetic retinopathy ([place])   ? NPDR OU  ? Hypertension   ? Hypertensive retinopathy   ?  OU  ? ?Past Surgical History:  ?Procedure Laterality Date  ? AV FISTULA PLACEMENT Left 09/28/2018  ? Procedure: Creation of Left arm Radiocephalic ARTERIOVENOUS (AV) FISTULA;  Surgeon: Marty Heck, MD;  Location: [facility];  Service: Vascular;  Laterality: Left;  ? MULTIPLE TOOTH EXTRACTIONS    ? ?FAMILY HISTORY ?Family History  ?Problem Relation Age of Onset  ? Heart failure Mother   ? ?SOCIAL HISTORY ?Social History  ? ?Tobacco Use  ? Smoking status: Former  ? Smokeless tobacco:  Never  ? Tobacco comments:  ?  quit 20 years ago  ?Vaping Use  ? Vaping Use: Never used  ?Substance Use Topics  ? Alcohol use: Not Currently  ? Drug use: Never  ?  ? ?  ?OPHTHALMIC EXAM: ? ?Not recorded ?  ? ?IMAGING AND PROCEDURES  ?Imaging and Procedures for _0 @ ? ? ?  ?  ? ?  ?ASSESSMENT/PLAN: ? ?No diagnosis found. ? ?1. Severe Non-proliferative diabetic retinopathy, both eyes ? - initial exam showed massive central DME OU and scattered IRH and IRMA ? - FA 6.5.19 with patches of capillary nonperfusion; extensive Mas with late leakage OU; no frank NV ? - FA 01.02.20, shows improvement in late leaking microaneurysms OU ? - S/P IVA OS #1 (06.05.19), #2 (07.08.19), #3 (07.08.19), #4 (09.03.19) ? - S/P IVA OD #1 (06.07.19), #2 (07.08.19), #3 (07.08.19) ? - S/P IVE OD #1 (09.03.19), #2 (10.03.19), #3 (11.04.19), #4 (12.02.19), #5 (01.02.20), #6 (02.07.20), #7 (03.16.20), #8 (05.20.20), #9 (06.22.20), #10 (07.27.20), #11(09.09.10), # 12 (10.28.20), #13 (11.25.20), #14 (04.06.21) - sample, #15 (05.04.21) - sample, #16 (06.18.21), #17 (07.30.21), #18 (09.10.21), #19 (11.16.21), #20 (1.7.22), #21 (4.12.22), #22 (06.07.22), #23 (8.1.22), #24 (10.11.22), #25 (12.6.22), #26 (01.31.23) ? - S/P IVE OS #1 (10.03.19), #2 (11.04.19), #3 (12.02.19), #4 (01.02.20), #5 (02.07.20), #6 (03.16.20), #7 (05.20.20), #8 (06.22.20),#9 (07.27.20), #10 (09.09.20), #11 (10.28.20), #12 (11.25.20) ? - OCT shows OD: Mild interval improvement in IRF central and temporal macula; +ERM and macular pucker; OS: trace cystic changes - stably improved at 8 wks ? - BCVA: OD 20/25 -- improved,  OS 20/20 -- stable ? - recommend IVE OD #27 today, 03.29.23 ? - will hold off on injection OS again today -- IRF improving without therapy and BCVA 20/20 ? - pt in agreement ? - RBA of procedure discussed, questions answered ? - informed consent obtained and signed ? - see procedure note -- tolerated well ? Alfonse Flavors paperwork and benefits investigation started on  08.05.19 -- approved for 2022 ? - f/u in 8 weeks -- DFE/OCT/possible injection ? ?2,3. Hypertensive retinopathy OU ? - discussed importance of tight BP control ? - had some Bps in hypertensive emergency range (200s / 110s) ? - BP improved post kidney transplant ? - discussed likely contribution to DME ? - monitor ?            - BP management per nephrology, PCP and cardiology ? ?4. Combined form age-related cataract OU-  ? - The symptoms of cataract, surgical options, and treatments and risks were discussed with patient. ? - discussed diagnosis and progression ? - not yet visually significant ? - monitor for now ? ?Ophthalmic Meds Ordered this visit:  ?No orders of the defined types were placed in this encounter. ? ?  ? ?No follow-ups on file. ? ?There are no Patient Instructions on file for this visit. ? ?Explained the diagnoses, plan, and follow up with the patient and they expressed understanding.  Patient expressed understanding of the importance of proper follow up care.  ? ?This document serves as a record of services personally performed by Gardiner Sleeper, MD, PhD. It was created on their behalf by Orvan Falconer, an ophthalmic technician. The creation of this record is the provider's dictation and/or activities during the visit.   ? ?Electronically signed by: Orvan Falconer, OA, 06/24/21  3:07 PM ? ? ?Gardiner Sleeper, M.D., Ph.D. ?Diseases & Surgery of the Retina and Vitreous ?[place] ?1.31.23 ? ?I have reviewed the above documentation for accuracy and completeness, and I agree with the above. Gardiner Sleeper, M.D., Ph.D. 05/07/21 3:07 PM ? ? ?Abbreviations: ?M myopia (nearsighted); A astigmatism; H hyperopia (farsighted); P presbyopia; Mrx spectacle prescription;  CTL contact lenses; OD right eye; OS left eye; OU both eyes  XT exotropia; ET esotropia; PEK punctate epithelial keratitis; PEE punctate epithelial erosions; DES dry eye syndrome; MGD meibomian gland dysfunction;  ATs artificial tears; PFAT's preservative free artificial tears; [place] nuclear sclerotic cataract; PSC posterior subcapsular cataract; ERM epi-retinal membrane; PVD posterior vitreous detachment; RD retinal d

## 2021-06-30 ENCOUNTER — Encounter (INDEPENDENT_AMBULATORY_CARE_PROVIDER_SITE_OTHER): Payer: Medicare Other | Admitting: Ophthalmology

## 2021-06-30 DIAGNOSIS — E113413 Type 2 diabetes mellitus with severe nonproliferative diabetic retinopathy with macular edema, bilateral: Secondary | ICD-10-CM

## 2021-06-30 DIAGNOSIS — H35033 Hypertensive retinopathy, bilateral: Secondary | ICD-10-CM

## 2021-06-30 DIAGNOSIS — H25813 Combined forms of age-related cataract, bilateral: Secondary | ICD-10-CM

## 2021-06-30 DIAGNOSIS — I10 Essential (primary) hypertension: Secondary | ICD-10-CM

## 2021-07-16 NOTE — Progress Notes (Shared)
?[facility] Note ? ?07/20/2021 ? ?  ? ?CHIEF COMPLAINT ?Patient presents for No chief complaint on file. ? ?HISTORY OF PRESENT ILLNESS: ?Philip Wilson is a 51 y.o. male who presents to the clinic today for:  ? ? ? ?Referring physician: ?Lillard Anes, MD ?[address] ?[address] ?[place],  [place] [address] ? ?HISTORICAL INFORMATION:  ? ?Selected notes from the [facility] ?Referred by Dr. Ricki Vasek for diabetic retinopathy OU  ? ?CURRENT MEDICATIONS: ?No current outpatient medications on file. (Ophthalmic Drugs)  ? ?Current Facility-Administered Medications (Ophthalmic Drugs)  ?Medication Route  ? aflibercept (EYLEA) SOLN 2 mg Intravitreal  ? aflibercept (EYLEA) SOLN 2 mg Intravitreal  ? aflibercept (EYLEA) SOLN 2 mg Intravitreal  ? aflibercept (EYLEA) SOLN 2 mg Intravitreal  ? aflibercept (EYLEA) SOLN 2 mg Intravitreal  ? aflibercept (EYLEA) SOLN 2 mg Intravitreal  ? aflibercept (EYLEA) SOLN 2 mg Intravitreal  ? aflibercept (EYLEA) SOLN 2 mg Intravitreal  ? aflibercept (EYLEA) SOLN 2 mg Intravitreal  ? aflibercept (EYLEA) SOLN 2 mg Intravitreal  ? aflibercept (EYLEA) SOLN 2 mg Intravitreal  ? ?Current Outpatient Medications (Other)  ?Medication Sig  ? ACCU-CHEK GUIDE test strip TEST THREE TIMES DAILY  ? AgaMatrix Ultra-Thin Lancets MISC 200 each by Misc.(Non-Drug; Combo Route) route 4 times daily. One touch delica  ? amLODipine (NORVASC) 2.5 MG tablet Take 2.5 mg by mouth daily.  ? aspirin 81 MG EC tablet Take by mouth.  ? atorvastatin (LIPITOR) 40 MG tablet TAKE 1 TABLET BY MOUTH DAILY (Patient taking differently: Take 40 mg by mouth 3 (three) times a week. M, W, F)  ? BD PEN NEEDLE NANO 2ND GEN 32G X 4 MM MISC 4 (four) times daily. as directed  ? Blood Glucose Monitoring Suppl (FIFTY50 GLUCOSE METER 2.0) w/Device KIT 1 each by Other route 4 times daily. Use as instructed one touch ultra  ? insulin aspart (NOVOLOG) 100 UNIT/ML FlexPen Inject 20 Units into the  skin 3 (three) times daily with meals. Modified dose before meals based on sliding scale. Max 30 units.  ? Insulin Glargine (BASAGLAR KWIKPEN) 100 UNIT/ML INJECT 38 UNITS UNDER SKIN EVERY NIGHT AT BEDTIME  ? labetalol (NORMODYNE) 200 MG tablet Take by mouth.  ? metFORMIN (GLUCOPHAGE-XR) 500 MG 24 hr tablet TAKE 2 TABLETS BY MOUTH EVERY DAY  ? mycophenolate (MYFORTIC) 180 MG EC tablet Take by mouth.  ? NIFEdipine (PROCARDIA-XL/NIFEDICAL-XL) 30 MG 24 hr tablet Take 30 mg by mouth daily.  ? pantoprazole (PROTONIX) 40 MG tablet TAKE 1 TABLET(40 MG) BY MOUTH DAILY  ? predniSONE (DELTASONE) 5 MG tablet Take by mouth. (Patient not taking: Reported on 05/04/2021)  ? tacrolimus (PROGRAF) 1 MG capsule Take by mouth.  ? ?Current Facility-Administered Medications (Other)  ?Medication Route  ? Bevacizumab (AVASTIN) SOLN 1.25 mg Intravitreal  ? Bevacizumab (AVASTIN) SOLN 1.25 mg Intravitreal  ? Bevacizumab (AVASTIN) SOLN 1.25 mg Intravitreal  ? Bevacizumab (AVASTIN) SOLN 1.25 mg Intravitreal  ? Bevacizumab (AVASTIN) SOLN 1.25 mg Intravitreal  ? Bevacizumab (AVASTIN) SOLN 1.25 mg Intravitreal  ? Bevacizumab (AVASTIN) SOLN 1.25 mg Intravitreal  ? ?REVIEW OF SYSTEMS: ? ? ?ALLERGIES ?No Known Allergies ? ?PAST MEDICAL HISTORY ?Past Medical History:  ?Diagnosis Date  ? Cataract   ? OU  ? Chronic kidney disease 05/04/2018  ? Diabetes 1.5, managed as type 2 ([place])   ? Diabetic retinopathy ([place])   ? NPDR OU  ? Hypertension   ? Hypertensive retinopathy   ?  OU  ? ?Past Surgical History:  ?Procedure Laterality Date  ? AV FISTULA PLACEMENT Left 09/28/2018  ? Procedure: Creation of Left arm Radiocephalic ARTERIOVENOUS (AV) FISTULA;  Surgeon: Marty Heck, MD;  Location: [facility];  Service: Vascular;  Laterality: Left;  ? MULTIPLE TOOTH EXTRACTIONS    ? ?FAMILY HISTORY ?Family History  ?Problem Relation Age of Onset  ? Heart failure Mother   ? ?SOCIAL HISTORY ?Social History  ? ?Tobacco Use  ? Smoking status: Former  ? Smokeless tobacco:  Never  ? Tobacco comments:  ?  quit 20 years ago  ?Vaping Use  ? Vaping Use: Never used  ?Substance Use Topics  ? Alcohol use: Not Currently  ? Drug use: Never  ?  ? ?  ?OPHTHALMIC EXAM: ? ?Not recorded ?  ? ?IMAGING AND PROCEDURES  ?Imaging and Procedures for '@TODAY' @ ? ? ?  ?  ? ?  ?ASSESSMENT/PLAN: ? ?  ICD-10-CM   ?1. Severe nonproliferative diabetic retinopathy of both eyes with macular edema associated with type 2 diabetes mellitus ([place])  G99.2426   ?  ?2. Essential hypertension  I10   ?  ?3. Hypertensive retinopathy of both eyes  H35.033   ?  ?4. Combined forms of age-related cataract of both eyes  H25.813   ?  ? ?1. Severe Non-proliferative diabetic retinopathy, both eyes ? - initial exam showed massive central DME OU and scattered IRH and IRMA ? - FA 6.5.19 with patches of capillary nonperfusion; extensive Mas with late leakage OU; no frank NV ? - FA 01.02.20, shows improvement in late leaking microaneurysms OU ? - S/P IVA OS #1 (06.05.19), #2 (07.08.19), #3 (07.08.19), #4 (09.03.19) ? - S/P IVA OD #1 (06.07.19), #2 (07.08.19), #3 (07.08.19) ? - S/P IVE OD #1 (09.03.19), #2 (10.03.19), #3 (11.04.19), #4 (12.02.19), #5 (01.02.20), #6 (02.07.20), #7 (03.16.20), #8 (05.20.20), #9 (06.22.20), #10 (07.27.20), #11(09.09.10), # 12 (10.28.20), #13 (11.25.20), #14 (04.06.21) - sample, #15 (05.04.21) - sample, #16 (06.18.21), #17 (07.30.21), #18 (09.10.21), #19 (11.16.21), #20 (1.7.22), #21 (4.12.22), #22 (06.07.22), #23 (8.1.22), #24 (10.11.22), #25 (12.06.22), #26 (01.31.23) ? - S/P IVE OS #1 (10.03.19), #2 (11.04.19), #3 (12.02.19), #4 (01.02.20), #5 (02.07.20), #6 (03.16.20), #7 (05.20.20), #8 (06.22.20),#9 (07.27.20), #10 (09.09.20), #11 (10.28.20), #12 (11.25.20) ? - OCT shows OD: Mild interval improvement in IRF central and temporal macula; +ERM and macular pucker; OS: trace cystic changes - stably improved at 8 wks ? - BCVA: OD 20/25 -- improved,  OS 20/20 -- stable ? - recommend IVE OD #26 today, 1.31.23 ? -  will hold off on injection OS again today -- IRF improving without therapy and BCVA 20/20 ? - pt in agreement ? - RBA of procedure discussed, questions answered ? - informed consent obtained and signed ? - see procedure note -- tolerated well ? Alfonse Flavors paperwork and benefits investigation started on 08.05.19 -- approved for 2022 ? - f/u in 8 weeks -- DFE/OCT/possible injection ? ?2,3. Hypertensive retinopathy OU ? - discussed importance of tight BP control ? - had some Bps in hypertensive emergency range (200s / 110s) ? - BP improved post kidney transplant ? - discussed likely contribution to DME ? - monitor ?            - BP management per nephrology, PCP and cardiology ? ?4. Combined form age-related cataract OU-  ? - The symptoms of cataract, surgical options, and treatments and risks were discussed with patient. ? - discussed diagnosis and progression ? -  not yet visually significant ? - monitor for now ? ?Ophthalmic Meds Ordered this visit:  ?No orders of the defined types were placed in this encounter. ? ?  ? ?No follow-ups on file. ? ?There are no Patient Instructions on file for this visit. ? ?Explained the diagnoses, plan, and follow up with the patient and they expressed understanding.  Patient expressed understanding of the importance of proper follow up care.  ? ?This document serves as a record of services personally performed by Gardiner Sleeper, MD, PhD. It was created on their behalf by San Jetty. Owens Shark, OA an ophthalmic technician. The creation of this record is the provider's dictation and/or activities during the visit.   ? ?Electronically signed by: San Jetty. Owens Shark, [place] 04.14.2023 11:22 AM ? ? ?Gardiner Sleeper, M.D., Ph.D. ?Diseases & Surgery of the Retina and Vitreous ?[place] ? ? ? ?Abbreviations: ?M myopia (nearsighted); A astigmatism; H hyperopia (farsighted); P presbyopia; Mrx spectacle prescription;  CTL contact lenses; OD right eye; OS left eye; OU both eyes  XT  exotropia; ET esotropia; PEK punctate epithelial keratitis; PEE punctate epithelial erosions; DES dry eye syndrome; MGD meibomian gland dysfunction; ATs artificial tears; PFAT's preservative free artificial t

## 2021-07-20 ENCOUNTER — Encounter (INDEPENDENT_AMBULATORY_CARE_PROVIDER_SITE_OTHER): Payer: Medicare Other | Admitting: Ophthalmology

## 2021-07-20 DIAGNOSIS — H25813 Combined forms of age-related cataract, bilateral: Secondary | ICD-10-CM

## 2021-07-20 DIAGNOSIS — E113413 Type 2 diabetes mellitus with severe nonproliferative diabetic retinopathy with macular edema, bilateral: Secondary | ICD-10-CM

## 2021-07-20 DIAGNOSIS — I10 Essential (primary) hypertension: Secondary | ICD-10-CM

## 2021-07-20 DIAGNOSIS — H35033 Hypertensive retinopathy, bilateral: Secondary | ICD-10-CM

## 2021-07-22 NOTE — Progress Notes (Addendum)
?[facility] Note ? ?07/27/2021 ? ?  ? ?CHIEF COMPLAINT ?Patient presents for Retina Follow Up ? ?HISTORY OF PRESENT ILLNESS: ?Philip Wilson is a 51 y.o. male who presents to the clinic today for:  ? ?HPI   ? ? Retina Follow Up   ?Patient presents with  Diabetic Retinopathy (IVE OD # 51 (1.31.23) ?IVE OS # 12 (11.25.20)).  In both eyes.  This started years ago.  Duration of 8 weeks.  Since onset it is stable.  I, the attending physician,  performed the HPI with the patient and updated documentation appropriately. ? ?  ?  ? ? Comments   ?Patient feels that the vision has remained stable since his last visit 8 weeks ago. His blood sugar this morning was 153 and his A1C 13.7 (07/12/21). ? ?  ?  ?Last edited by Bernarda Caffey, MD on 07/28/2021  5:41 PM.  ?  ?Patient delayed by 4 weeks due to family emergecy. Patient feels the same since last visit.  ? ? ?Referring physician: ?Lillard Anes, MD ?[address] ?[address] ?[place],  [place] [address] ? ?HISTORICAL INFORMATION:  ? ?Selected notes from the [facility] ?Referred by Dr. Ricki Hazelip for diabetic retinopathy OU  ? ?CURRENT MEDICATIONS: ?No current outpatient medications on file. (Ophthalmic Drugs)  ? ?Current Facility-Administered Medications (Ophthalmic Drugs)  ?Medication Route  ? aflibercept (EYLEA) SOLN 2 mg Intravitreal  ? aflibercept (EYLEA) SOLN 2 mg Intravitreal  ? aflibercept (EYLEA) SOLN 2 mg Intravitreal  ? aflibercept (EYLEA) SOLN 2 mg Intravitreal  ? aflibercept (EYLEA) SOLN 2 mg Intravitreal  ? aflibercept (EYLEA) SOLN 2 mg Intravitreal  ? aflibercept (EYLEA) SOLN 2 mg Intravitreal  ? aflibercept (EYLEA) SOLN 2 mg Intravitreal  ? aflibercept (EYLEA) SOLN 2 mg Intravitreal  ? aflibercept (EYLEA) SOLN 2 mg Intravitreal  ? aflibercept (EYLEA) SOLN 2 mg Intravitreal  ? ?Current Outpatient Medications (Other)  ?Medication Sig  ? ACCU-CHEK GUIDE test strip TEST THREE TIMES DAILY  ? AgaMatrix Ultra-Thin Lancets  MISC 200 each by Misc.(Non-Drug; Combo Route) route 4 times daily. One touch delica  ? amLODipine (NORVASC) 2.5 MG tablet Take 2.5 mg by mouth daily.  ? aspirin 81 MG EC tablet Take by mouth.  ? atorvastatin (LIPITOR) 40 MG tablet TAKE 1 TABLET BY MOUTH DAILY (Patient taking differently: Take 40 mg by mouth 3 (three) times a week. M, W, F)  ? BD PEN NEEDLE NANO 2ND GEN 32G X 4 MM MISC 4 (four) times daily. as directed  ? Blood Glucose Monitoring Suppl (FIFTY50 GLUCOSE METER 2.0) w/Device KIT 1 each by Other route 4 times daily. Use as instructed one touch ultra  ? insulin aspart (NOVOLOG) 100 UNIT/ML FlexPen Inject 20 Units into the skin 3 (three) times daily with meals. Modified dose before meals based on sliding scale. Max 30 units.  ? Insulin Glargine (BASAGLAR KWIKPEN) 100 UNIT/ML INJECT 38 UNITS UNDER SKIN EVERY NIGHT AT BEDTIME  ? labetalol (NORMODYNE) 200 MG tablet Take by mouth.  ? metFORMIN (GLUCOPHAGE-XR) 500 MG 24 hr tablet TAKE 2 TABLETS BY MOUTH EVERY DAY  ? mycophenolate (MYFORTIC) 180 MG EC tablet Take by mouth.  ? NIFEdipine (PROCARDIA-XL/NIFEDICAL-XL) 30 MG 24 hr tablet Take 30 mg by mouth daily.  ? pantoprazole (PROTONIX) 40 MG tablet TAKE 1 TABLET(40 MG) BY MOUTH DAILY  ? predniSONE (DELTASONE) 5 MG tablet Take by mouth.  ? tacrolimus (PROGRAF) 1 MG capsule Take by mouth.  ? ?  Current Facility-Administered Medications (Other)  ?Medication Route  ? Bevacizumab (AVASTIN) SOLN 1.25 mg Intravitreal  ? Bevacizumab (AVASTIN) SOLN 1.25 mg Intravitreal  ? Bevacizumab (AVASTIN) SOLN 1.25 mg Intravitreal  ? Bevacizumab (AVASTIN) SOLN 1.25 mg Intravitreal  ? Bevacizumab (AVASTIN) SOLN 1.25 mg Intravitreal  ? Bevacizumab (AVASTIN) SOLN 1.25 mg Intravitreal  ? Bevacizumab (AVASTIN) SOLN 1.25 mg Intravitreal  ? ?REVIEW OF SYSTEMS: ?ROS   ?Positive for: Neurological, Genitourinary, Endocrine, Eyes ?Negative for: Constitutional, Gastrointestinal, Skin, Musculoskeletal, HENT, Cardiovascular, Respiratory,  Psychiatric, Allergic/Imm, Heme/Lymph ?Last edited by Annie Paras, COT on 07/27/2021  2:02 PM.  ?  ? ? ?ALLERGIES ?No Known Allergies ? ?PAST MEDICAL HISTORY ?Past Medical History:  ?Diagnosis Date  ? Cataract   ? OU  ? Chronic kidney disease 05/04/2018  ? Diabetes 1.5, managed as type 2 ([place])   ? Diabetic retinopathy ([place])   ? NPDR OU  ? Hypertension   ? Hypertensive retinopathy   ? OU  ? ?Past Surgical History:  ?Procedure Laterality Date  ? AV FISTULA PLACEMENT Left 09/28/2018  ? Procedure: Creation of Left arm Radiocephalic ARTERIOVENOUS (AV) FISTULA;  Surgeon: Marty Heck, MD;  Location: [facility];  Service: Vascular;  Laterality: Left;  ? MULTIPLE TOOTH EXTRACTIONS    ? ?FAMILY HISTORY ?Family History  ?Problem Relation Age of Onset  ? Heart failure Mother   ? ?SOCIAL HISTORY ?Social History  ? ?Tobacco Use  ? Smoking status: Former  ? Smokeless tobacco: Never  ? Tobacco comments:  ?  quit 20 years ago  ?Vaping Use  ? Vaping Use: Never used  ?Substance Use Topics  ? Alcohol use: Not Currently  ? Drug use: Never  ?  ? ?  ?OPHTHALMIC EXAM: ? ?Base Eye Exam   ? ? Visual Acuity (Snellen - Linear)   ? ?   Right Left  ? Dist [place] 20/50 +2 20/50  ? Dist ph [place] 20/30 20/20 +2  ? ?  ?  ? ? Tonometry (Tonopen, 2:19 PM)   ? ?   Right Left  ? Pressure 14 17  ? ?  ?  ? ? Pupils   ? ?   Pupils Dark Light Shape React APD  ? Right PERRL 3 2 Round Brisk None  ? Left PERRL 3 2 Round Brisk None  ? ?  ?  ? ? Visual Fields   ? ?   Left Right  ?  Full Full  ? ?  ?  ? ? Extraocular Movement   ? ?   Right Left  ?  Full, Ortho Full, Ortho  ? ?  ?  ? ? Neuro/Psych   ? ? Oriented x3: Yes  ? Mood/Affect: Normal  ? ?  ?  ? ? Dilation   ? ? Both eyes: 2.5% Phenylephrine, 1.0% Mydriacyl @ 2:03 PM  ? ?  ?  ? ?  ? ?Slit Lamp and Fundus Exam   ? ? Slit Lamp Exam   ? ?   Right Left  ? Lids/Lashes Dermatochalasis - upper lid, mild Meibomian gland dysfunction Dermatochalasis - upper lid, mild Meibomian gland dysfunction  ?  Conjunctiva/Sclera White and quiet White and quiet  ? Cornea trace PEE trace PEE  ? Anterior Chamber Deep and quiet, no cell or flare Deep and quiet, no cell or flare  ? Iris Round and dilated to 97m Round and dilated to 71m ? Lens 1+ Nuclear sclerosis, 2+ Cortical cataract 1+ Nuclear sclerosis, 2+ Cortical cataract  ?  Anterior Vitreous Mild Vitreous syneresis, Posterior vitreous detachment, Weiss ring Mild Vitreous syneresis, old white VH inferiorly  ? ?  ?  ? ? Fundus Exam   ? ?   Right Left  ? Disc Pink and Sharp, mild pallor Pink and Sharp  ? C/D Ratio 0.2 0.3  ? Macula good foveal reflex, +ERM greatest superior to fovea, persistent cystic changes -- slightly increased Flat, good foveal reflex, scattered MA stably improved, mild ERM  ? Vessels attenuated, mild tortuousity, mild Copper wiring mild attenuation, mild tortuousity  ? Periphery Attached, scattered MAs and DBH - mostly posterior, No RT/RD Attached, rare MA  ? ?  ?  ? ?  ? ?Refraction   ? ? Manifest Refraction   ? ?   Sphere Cylinder Axis Dist VA  ? Right -1.25 +1.25 180 20/30+2  ? Left -1.75 +0.50 025 20/20  ? ?  ?  ? ?  ? ?IMAGING AND PROCEDURES  ?Imaging and Procedures for '@TODAY' @ ? ?OCT, Retina - OU - Both Eyes   ? ?   ?Right Eye ?Quality was good. Central Foveal Thickness: 332. Progression has worsened. Findings include intraretinal fluid, retinal drusen , intraretinal hyper-reflective material, no SRF, abnormal foveal contour, epiretinal membrane, macular pucker (Mild interval increase in IRF central and temporal macula; +ERM and macular pucker).  ? ?Left Eye ?Quality was good. Central Foveal Thickness: 251. Progression has been stable. Findings include epiretinal membrane, no SRF, intraretinal hyper-reflective material, normal foveal contour, no IRF (Trace cystic changes stably improved).  ? ?Notes ?*Images captured and stored on drive ? ?Diagnosis / Impression:  ?DME OU ?OD: Mild interval increase in IRF central and temporal macula; +ERM and  macular pucker ?OS: Trace cystic changes -- stably improved ? ?Clinical management:  ?See below ? ?Abbreviations: NFP - Normal foveal profile. CME - cystoid macular edema. PED - pigment epithelial detachment. IRF - intrareti

## 2021-07-27 ENCOUNTER — Ambulatory Visit (INDEPENDENT_AMBULATORY_CARE_PROVIDER_SITE_OTHER): Payer: BC Managed Care – PPO | Admitting: Ophthalmology

## 2021-07-27 ENCOUNTER — Encounter (INDEPENDENT_AMBULATORY_CARE_PROVIDER_SITE_OTHER): Payer: Self-pay | Admitting: Ophthalmology

## 2021-07-27 DIAGNOSIS — I10 Essential (primary) hypertension: Secondary | ICD-10-CM

## 2021-07-27 DIAGNOSIS — H35033 Hypertensive retinopathy, bilateral: Secondary | ICD-10-CM

## 2021-07-27 DIAGNOSIS — H25813 Combined forms of age-related cataract, bilateral: Secondary | ICD-10-CM

## 2021-07-27 DIAGNOSIS — E113413 Type 2 diabetes mellitus with severe nonproliferative diabetic retinopathy with macular edema, bilateral: Secondary | ICD-10-CM

## 2021-07-28 ENCOUNTER — Encounter (INDEPENDENT_AMBULATORY_CARE_PROVIDER_SITE_OTHER): Payer: Self-pay | Admitting: Ophthalmology

## 2021-07-28 MED ORDER — AFLIBERCEPT 2MG/0.05ML IZ SOLN FOR KALEIDOSCOPE
2.0000 mg | INTRAVITREAL | Status: AC | PRN
  Administered 2021-07-28: 2 mg via INTRAVITREAL

## 2021-08-04 ENCOUNTER — Ambulatory Visit: Payer: Medicare Other | Admitting: Legal Medicine

## 2021-09-14 ENCOUNTER — Ambulatory Visit: Payer: Medicare PPO | Admitting: Legal Medicine

## 2021-09-14 ENCOUNTER — Encounter: Payer: Self-pay | Admitting: Legal Medicine

## 2021-09-14 VITALS — BP 130/70 | HR 87 | Temp 99.2°F | Resp 15 | Ht 70.0 in | Wt 227.0 lb

## 2021-09-14 DIAGNOSIS — E782 Mixed hyperlipidemia: Secondary | ICD-10-CM

## 2021-09-14 DIAGNOSIS — I10 Essential (primary) hypertension: Secondary | ICD-10-CM | POA: Diagnosis not present

## 2021-09-14 DIAGNOSIS — Z940 Kidney transplant status: Secondary | ICD-10-CM

## 2021-09-14 DIAGNOSIS — Z794 Long term (current) use of insulin: Secondary | ICD-10-CM

## 2021-09-14 DIAGNOSIS — N2581 Secondary hyperparathyroidism of renal origin: Secondary | ICD-10-CM

## 2021-09-14 DIAGNOSIS — Z6830 Body mass index (BMI) 30.0-30.9, adult: Secondary | ICD-10-CM

## 2021-09-14 DIAGNOSIS — E1121 Type 2 diabetes mellitus with diabetic nephropathy: Secondary | ICD-10-CM

## 2021-09-14 DIAGNOSIS — Z114 Encounter for screening for human immunodeficiency virus [HIV]: Secondary | ICD-10-CM

## 2021-09-14 DIAGNOSIS — Z1159 Encounter for screening for other viral diseases: Secondary | ICD-10-CM

## 2021-09-14 DIAGNOSIS — Z6833 Body mass index (BMI) 33.0-33.9, adult: Secondary | ICD-10-CM | POA: Insufficient documentation

## 2021-09-14 DIAGNOSIS — E119 Type 2 diabetes mellitus without complications: Secondary | ICD-10-CM | POA: Diagnosis not present

## 2021-09-14 DIAGNOSIS — D849 Immunodeficiency, unspecified: Secondary | ICD-10-CM

## 2021-09-14 DIAGNOSIS — Z1211 Encounter for screening for malignant neoplasm of colon: Secondary | ICD-10-CM

## 2021-09-14 NOTE — Progress Notes (Signed)
Subjective:  Patient ID: Philip Wilson, male    DOB: 1971-02-14  Age: 51 y.o. MRN: [id number]  Chief Complaint  Patient presents with   Diabetes   Hyperlipidemia   Hypertension    HPI: Chronic visit Patient present with type 2 diabetes. Compliance with treatment has been good; patient take medicines as directed, maintains diet and exercise regimen, follows up as directed, and is keeping glucose diary. Current medicines for diabetes Novolog 20 units TID slidding scale plus 10 units, Basaglar 38 units every night at bedtime, Metformin 500 mg every day. Patient performs foot exams daily and last ophthalmologic exam was scheduled.   Patient presents for follow up of hypertension.  Patient tolerating Nifedipine 30 mg daily, Aspirin 81 mg daily, Labetalol 400 mg BID well without side effects.  Patient was diagnosed with hypertension and has been treated for hypertension for 12 years.Patient is working on maintaining diet and exercise regimen and follows up as directed. Complication include Kidney transplant and Diabetes.  Patient presents with hyperlipidemia.  Compliance with treatment has been good; patient takes medicines as directed, maintains low cholesterol diet, follows up as directed, and maintains exercise regimen.  Patient is using Atorvastatin 40 mg three times per week without problems.   Patient has renal transplant and no evidence for rejection for 3 years Current Outpatient Medications on File Prior to Visit  Medication Sig Dispense Refill   ACCU-CHEK GUIDE test strip TEST THREE TIMES DAILY 100 strip 6   AgaMatrix Ultra-Thin Lancets MISC 200 each by Misc.(Non-Drug; Combo Route) route 4 times daily. One touch delica     amLODipine (NORVASC) 2.5 MG tablet Take 2.5 mg by mouth daily.     aspirin 81 MG EC tablet Take by mouth.     atorvastatin (LIPITOR) 40 MG tablet TAKE 1 TABLET BY MOUTH DAILY (Patient taking differently: Take 40 mg by mouth 3 (three) times a week. M, W, F) 90  tablet 2   BD PEN NEEDLE NANO 2ND GEN 32G X 4 MM MISC 4 (four) times daily. as directed     Blood Glucose Monitoring Suppl (FIFTY50 GLUCOSE METER 2.0) w/Device KIT 1 each by Other route 4 times daily. Use as instructed one touch ultra     insulin aspart (NOVOLOG) 100 UNIT/ML FlexPen Inject 20 Units into the skin 3 (three) times daily with meals. Modified dose before meals based on sliding scale. Max 30 units. (Patient taking differently: Inject 20 Units into the skin 3 (three) times daily with meals. Modified dose before meals based on sliding scale. Max 30 units. Plus 10) 15 mL 6   Insulin Glargine (BASAGLAR KWIKPEN) 100 UNIT/ML INJECT 38 UNITS UNDER SKIN EVERY NIGHT AT BEDTIME 12 mL 3   labetalol (NORMODYNE) 200 MG tablet Take 400 mg by mouth 2 (two) times daily.     metFORMIN (GLUCOPHAGE-XR) 500 MG 24 hr tablet TAKE 2 TABLETS BY MOUTH EVERY DAY 180 tablet 2   mycophenolate (MYFORTIC) 180 MG EC tablet Take 360 mg by mouth 2 (two) times daily.     NIFEdipine (PROCARDIA-XL/NIFEDICAL-XL) 30 MG 24 hr tablet Take 30 mg by mouth daily.     pantoprazole (PROTONIX) 40 MG tablet TAKE 1 TABLET(40 MG) BY MOUTH DAILY 90 tablet 2   predniSONE (DELTASONE) 5 MG tablet Take 5 mg by mouth daily with breakfast.     tacrolimus (PROGRAF) 1 MG capsule Take by mouth.     valGANciclovir (VALCYTE) 450 MG tablet Take 900 mg by mouth daily.  Current Facility-Administered Medications on File Prior to Visit  Medication Dose Route Frequency Provider Last Rate Last Admin   aflibercept (EYLEA) SOLN 2 mg  2 mg Intravitreal  Bernarda Caffey, MD   2 mg at 12/05/17 1138   aflibercept (EYLEA) SOLN 2 mg  2 mg Intravitreal  Bernarda Caffey, MD   2 mg at 01/04/18 1202   aflibercept (EYLEA) SOLN 2 mg  2 mg Intravitreal  Bernarda Caffey, MD   2 mg at 01/04/18 1203   aflibercept (EYLEA) SOLN 2 mg  2 mg Intravitreal  Bernarda Caffey, MD   2 mg at 02/05/18 0930   aflibercept (EYLEA) SOLN 2 mg  2 mg Intravitreal  Bernarda Caffey, MD   2 mg at  02/05/18 0930   aflibercept (EYLEA) SOLN 2 mg  2 mg Intravitreal  Bernarda Caffey, MD   2 mg at 03/05/18 1649   aflibercept (EYLEA) SOLN 2 mg  2 mg Intravitreal  Bernarda Caffey, MD   2 mg at 03/05/18 1649   aflibercept (EYLEA) SOLN 2 mg  2 mg Intravitreal  Bernarda Caffey, MD   2 mg at 04/05/18 2315   aflibercept (EYLEA) SOLN 2 mg  2 mg Intravitreal  Bernarda Caffey, MD   2 mg at 04/05/18 2315   aflibercept (EYLEA) SOLN 2 mg  2 mg Intravitreal  Bernarda Caffey, MD   2 mg at 05/13/18 2254   aflibercept (EYLEA) SOLN 2 mg  2 mg Intravitreal  Bernarda Caffey, MD   2 mg at 05/13/18 2255   Bevacizumab (AVASTIN) SOLN 1.25 mg  1.25 mg Intravitreal  Bernarda Caffey, MD   1.25 mg at 09/06/17 1312   Bevacizumab (AVASTIN) SOLN 1.25 mg  1.25 mg Intravitreal  Bernarda Caffey, MD   1.25 mg at 09/08/17 5366   Bevacizumab (AVASTIN) SOLN 1.25 mg  1.25 mg Intravitreal  Bernarda Caffey, MD   1.25 mg at 10/09/17 1144   Bevacizumab (AVASTIN) SOLN 1.25 mg  1.25 mg Intravitreal  Bernarda Caffey, MD   1.25 mg at 10/09/17 1144   Bevacizumab (AVASTIN) SOLN 1.25 mg  1.25 mg Intravitreal  Bernarda Caffey, MD   1.25 mg at 11/06/17 1105   Bevacizumab (AVASTIN) SOLN 1.25 mg  1.25 mg Intravitreal  Bernarda Caffey, MD   1.25 mg at 11/06/17 1105   Bevacizumab (AVASTIN) SOLN 1.25 mg  1.25 mg Intravitreal  Bernarda Caffey, MD   1.25 mg at 12/05/17 1138   Past Medical History:  Diagnosis Date   Cataract    OU   Chronic kidney disease 05/04/2018   Diabetes 1.5, managed as type 2 ([place])    Diabetic retinopathy ([place])    NPDR OU   Hypertension    Hypertensive retinopathy    OU   Past Surgical History:  Procedure Laterality Date   AV FISTULA PLACEMENT Left 09/28/2018   Procedure: Creation of Left arm Radiocephalic ARTERIOVENOUS (AV) FISTULA;  Surgeon: Marty Heck, MD;  Location: [facility] OR;  Service: Vascular;  Laterality: Left;   KIDNEY TRANSPLANT  04/02/2019   MULTIPLE TOOTH EXTRACTIONS      Family History  Problem Relation Age of Onset    Heart failure Mother    Social History   Socioeconomic History   Marital status: Married    Spouse name: Not on file   Number of children: Not on file   Years of education: Not on file   Highest education level: Not on file  Occupational History   Occupation: sales  Tobacco Use   Smoking  status: Former   Smokeless tobacco: Never   Tobacco comments:    quit 20 years ago  Vaping Use   Vaping Use: Never used  Substance and Sexual Activity   Alcohol use: Not Currently   Drug use: Never   Sexual activity: Yes    Partners: Female  Other Topics Concern   Not on file  Social History Narrative   Not on file   Social Determinants of Health   Financial Resource Strain: Not on file  Food Insecurity: Not on file  Transportation Needs: Not on file  Physical Activity: Not on file  Stress: Not on file  Social Connections: Not on file    Review of Systems  Constitutional:  Negative for chills, fatigue, fever and unexpected weight change.  HENT:  Negative for congestion, ear pain, sinus pain and sore throat.   Eyes:  Negative for visual disturbance.  Respiratory:  Negative for cough and shortness of breath.   Cardiovascular:  Negative for chest pain and palpitations.  Gastrointestinal:  Negative for abdominal pain, blood in stool, constipation, diarrhea, nausea and vomiting.  Endocrine: Negative for polydipsia.  Genitourinary:  Negative for dysuria.  Musculoskeletal:  Negative for back pain.  Skin:  Negative for rash.  Neurological:  Negative for headaches.     Objective:  BP 130/70   Pulse 87   Temp 99.2 F (37.3 C)   Resp 15   Ht '5\' 10"'  (1.778 m)   Wt 227 lb (103 kg)   SpO2 96%   BMI 32.57 kg/m      09/14/2021    2:21 PM 03/23/2021   10:45 AM 12/25/2018   12:05 PM  BP/Weight  Systolic BP 440 90 102  Diastolic BP 70 70 80  Wt. (Lbs) 227 216   BMI 32.57 kg/m2 30.99 kg/m2     Physical Exam Vitals reviewed.  Constitutional:      General: He is not in acute  distress.    Appearance: Normal appearance. He is obese.  HENT:     Head: Normocephalic.     Right Ear: Tympanic membrane normal.     Left Ear: Tympanic membrane normal.     Nose: Nose normal.     Mouth/Throat:     Mouth: Mucous membranes are moist.     Pharynx: Oropharynx is clear.  Eyes:     Extraocular Movements: Extraocular movements intact.     Conjunctiva/sclera: Conjunctivae normal.     Pupils: Pupils are equal, round, and reactive to light.  Cardiovascular:     Rate and Rhythm: Normal rate and regular rhythm.     Pulses: Normal pulses.     Heart sounds: Normal heart sounds. No murmur heard.    No gallop.  Pulmonary:     Effort: Pulmonary effort is normal. No respiratory distress.     Breath sounds: Normal breath sounds. No wheezing.  Abdominal:     General: Abdomen is flat. Bowel sounds are normal. There is no distension.     Tenderness: There is no abdominal tenderness.  Musculoskeletal:     Cervical back: Normal range of motion and neck supple.     Right lower leg: No edema.     Left lower leg: No edema.  Skin:    General: Skin is warm and dry.     Capillary Refill: Capillary refill takes less than 2 seconds.  Neurological:     General: No focal deficit present.     Mental Status: He is alert and oriented to  person, place, and time. Mental status is at baseline.     Gait: Gait normal.     Diabetic Foot Exam - Simple   Simple Foot Form Diabetic Foot exam was performed with the following findings: Yes 09/14/2021  2:45 PM  Visual Inspection No deformities, no ulcerations, no other skin breakdown bilaterally: Yes Sensation Testing Intact to touch and monofilament testing bilaterally: Yes Pulse Check Posterior Tibialis and Dorsalis pulse intact bilaterally: Yes Comments      Lab Results  Component Value Date   WBC 3.7 03/23/2021   HGB 14.7 03/23/2021   HCT 42.8 03/23/2021   PLT 137 (L) 03/23/2021   GLUCOSE 218 (H) 03/23/2021   CHOL 130 03/23/2021   TRIG  170 (H) 03/23/2021   HDL 38 (L) 03/23/2021   LDLCALC 63 03/23/2021   ALT 44 03/23/2021   AST 44 (H) 03/23/2021   NA 136 03/23/2021   K 4.9 03/23/2021   CL 101 03/23/2021   CREATININE 1.53 (H) 03/23/2021   BUN 23 03/23/2021   CO2 20 03/23/2021   INR 1.0 05/29/2018   HGBA1C 7.2 (H) 03/23/2021      Assessment & Plan:   Problem List Items Addressed This Visit       Cardiovascular and Mediastinum   Essential hypertension - Primary   Relevant Orders   Comprehensive metabolic panel   CBC with Differential/Platelet An individual hypertension care plan was established and reinforced today.  The patient's status was assessed using clinical findings on exam and labs or diagnostic tests. The patient's success at meeting treatment goals on disease specific evidence-based guidelines and found to be well controlled. SELF MANAGEMENT: The patient and I together assessed ways to personally work towards obtaining the recommended goals. RECOMMENDATIONS: avoid decongestants found in common cold remedies, decrease consumption of alcohol, perform routine monitoring of BP with home BP cuff, exercise, reduction of dietary salt, take medicines as prescribed, try not to miss doses and quit smoking.  Regular exercise and maintaining a healthy weight is needed.  Stress reduction may help. A CLINICAL SUMMARY including written plan identify barriers to care unique to individual due to social or financial issues.  We attempt to mutually creat solutions for individual and family understanding.      Endocrine   Insulin dependent type 2 diabetes mellitus ([place]) An individual care plan for diabetes was established and reinforced today.  The patient's status was assessed using clinical findings on exam, labs and diagnostic testing. Patient success at meeting goals based on disease specific evidence-based guidelines and found to be good controlled. Medications were assessed and patient's understanding of the medical  issues , including barriers were assessed. Recommend adherence to a diabetic diet, a graduated exercise program, HgbA1c level is checked quarterly, and urine microalbumin performed yearly .  Annual mono-filament sensation testing performed. Lower blood pressure and control hyperlipidemia is important. Get annual eye exams and annual flu shots and smoking cessation discussed.  Self management goals were discussed.     Diabetic glomerulopathy ([place])   Relevant Orders   Hemoglobin A1c   Microalbumin / creatinine urine ratio An individual care plan for diabetes was established and reinforced today.  The patient's status was assessed using clinical findings on exam, labs and diagnostic testing. Patient success at meeting goals based on disease specific evidence-based guidelines and found to be good controlled. Medications were assessed and patient's understanding of the medical issues , including barriers were assessed. Recommend adherence to a diabetic diet, a graduated exercise program, HgbA1c  level is checked quarterly, and urine microalbumin performed yearly .  Annual mono-filament sensation testing performed. Lower blood pressure and control hyperlipidemia is important. Get annual eye exams and annual flu shots and smoking cessation discussed.  Self management goals were discussed.     Secondary hyperparathyroidism, renal ([place]) Patient is stable with secondary hyperparathyroidism     Other   Renal transplant recipient Patient has renal transplant  in 2021, He is on immunodepressents with no symptoms of rejection    Immunosuppression [facility]) Patient is on chronic immunodepression    BMI 30.0-30.9,adult An individualize plan was formulated for obesity using patient history and physical exam to encourage weight loss.  An evidence based program was formulated.  Patient is to cut portion size with meals and to plan physical exercise 3 days a week at least 20 minutes.  Weight watchers and other programs  are helpful.  Planned amount of weight loss 10 lbs.    Other Visit Diagnoses     Mixed hyperlipidemia       Relevant Orders   Lipid panel   CBC with Differential/Platelet AN INDIVIDUAL CARE PLAN for hyperlipidemia/ cholesterol was established and reinforced today.  The patient's status was assessed using clinical findings on exam, lab and other diagnostic tests. The patient's disease status was assessed based on evidence-based guidelines and found to be fair controlled. MEDICATIONS were reviewed. SELF MANAGEMENT GOALS have been discussed and patient's success at attaining the goal of low cholesterol was assessed. RECOMMENDATION given include regular exercise 3 days a week and low cholesterol/low fat diet. CLINICAL SUMMARY including written plan to identify barriers unique to the patient due to social or economic  reasons was discussed.    Need for hepatitis C screening test       Relevant Orders   Hepatitis C Antibody   Screening for HIV (human immunodeficiency virus)       Relevant Medications   valGANciclovir (VALCYTE) 450 MG tablet   Other Relevant Orders   HIV antibody (with reflex)   Screening for colon cancer       Relevant Orders   Cologuard     .    Orders Placed This Encounter  Procedures   Comprehensive metabolic panel   Hemoglobin A1c   Lipid panel   Microalbumin / creatinine urine ratio   CBC with Differential/Platelet   Hepatitis C Antibody   HIV antibody (with reflex)   Cologuard     Follow-up: Return in about 3 months (around 12/15/2021).  An After Visit Summary was printed and given to the patient.  Reinaldo Meeker, MD [facility] [phone]

## 2021-09-15 LAB — HIV ANTIBODY (ROUTINE TESTING W REFLEX): HIV Screen 4th Generation wRfx: NONREACTIVE

## 2021-09-15 LAB — CBC WITH DIFFERENTIAL/PLATELET
Basophils Absolute: 0 10*3/uL (ref 0.0–0.2)
Basos: 0 %
EOS (ABSOLUTE): 0.1 10*3/uL (ref 0.0–0.4)
Eos: 2 %
Hematocrit: 40.7 % (ref 37.5–51.0)
Hemoglobin: 13.8 g/dL (ref 13.0–17.7)
Immature Grans (Abs): 0 10*3/uL (ref 0.0–0.1)
Immature Granulocytes: 0 %
Lymphocytes Absolute: 1.8 10*3/uL (ref 0.7–3.1)
Lymphs: 26 %
MCH: 29.3 pg (ref 26.6–33.0)
MCHC: 33.9 g/dL (ref 31.5–35.7)
MCV: 86 fL (ref 79–97)
Monocytes Absolute: 0.7 10*3/uL (ref 0.1–0.9)
Monocytes: 10 %
Neutrophils Absolute: 4.3 10*3/uL (ref 1.4–7.0)
Neutrophils: 62 %
Platelets: 237 10*3/uL (ref 150–450)
RBC: 4.71 x10E6/uL (ref 4.14–5.80)
RDW: 11.8 % (ref 11.6–15.4)
WBC: 7 10*3/uL (ref 3.4–10.8)

## 2021-09-15 LAB — LIPID PANEL
Chol/HDL Ratio: 2.7 ratio (ref 0.0–5.0)
Cholesterol, Total: 129 mg/dL (ref 100–199)
HDL: 47 mg/dL (ref >39)
LDL Chol Calc (NIH): 55 mg/dL (ref 0–99)
Triglycerides: 157 mg/dL — ABNORMAL HIGH (ref 0–149)
VLDL Cholesterol Cal: 27 mg/dL (ref 5–40)

## 2021-09-15 LAB — COMPREHENSIVE METABOLIC PANEL
ALT: 34 IU/L (ref 0–44)
AST: 28 IU/L (ref 0–40)
Albumin/Globulin Ratio: 1.5 (ref 1.2–2.2)
Albumin: 4.4 g/dL (ref 3.8–4.9)
Alkaline Phosphatase: 74 IU/L (ref 44–121)
BUN/Creatinine Ratio: 16 (ref 9–20)
BUN: 21 mg/dL (ref 6–24)
Bilirubin Total: 1.2 mg/dL (ref 0.0–1.2)
CO2: 19 mmol/L — ABNORMAL LOW (ref 20–29)
Calcium: 10 mg/dL (ref 8.7–10.2)
Chloride: 108 mmol/L — ABNORMAL HIGH (ref 96–106)
Creatinine, Ser: 1.33 mg/dL — ABNORMAL HIGH (ref 0.76–1.27)
Globulin, Total: 3 g/dL (ref 1.5–4.5)
Glucose: 68 mg/dL — ABNORMAL LOW (ref 70–99)
Potassium: 4.2 mmol/L (ref 3.5–5.2)
Sodium: 141 mmol/L (ref 134–144)
Total Protein: 7.4 g/dL (ref 6.0–8.5)
eGFR: 64 mL/min/{1.73_m2} (ref >59)

## 2021-09-15 LAB — MICROALBUMIN / CREATININE URINE RATIO
Creatinine, Urine: 159.1 mg/dL
Microalb/Creat Ratio: 11 mg/g creat (ref 0–29)
Microalbumin, Urine: 17.1 ug/mL

## 2021-09-15 LAB — HEPATITIS C ANTIBODY: Hep C Virus Ab: NONREACTIVE

## 2021-09-15 LAB — HEMOGLOBIN A1C
Est. average glucose Bld gHb Est-mCnc: 166 mg/dL
Hgb A1c MFr Bld: 7.4 % — ABNORMAL HIGH (ref 4.8–5.6)

## 2021-09-15 LAB — CARDIOVASCULAR RISK ASSESSMENT

## 2021-09-15 NOTE — Progress Notes (Signed)
Hepatitis c negative, HIV negative lp

## 2021-09-15 NOTE — Progress Notes (Signed)
Creatinine 1.33, liver tests normal, A1c 7.4 , triglycerides 157 high, Microalbumin normal, CBC normal,  lp

## 2021-09-21 ENCOUNTER — Encounter (INDEPENDENT_AMBULATORY_CARE_PROVIDER_SITE_OTHER): Payer: Medicare PPO | Admitting: Ophthalmology

## 2021-09-21 DIAGNOSIS — H25813 Combined forms of age-related cataract, bilateral: Secondary | ICD-10-CM

## 2021-09-21 DIAGNOSIS — I10 Essential (primary) hypertension: Secondary | ICD-10-CM

## 2021-09-21 DIAGNOSIS — H35033 Hypertensive retinopathy, bilateral: Secondary | ICD-10-CM

## 2021-09-21 DIAGNOSIS — E113413 Type 2 diabetes mellitus with severe nonproliferative diabetic retinopathy with macular edema, bilateral: Secondary | ICD-10-CM

## 2021-10-06 NOTE — Progress Notes (Signed)
[facility] Note  10/08/2021     CHIEF COMPLAINT Patient presents for Retina Follow Up  HISTORY OF PRESENT ILLNESS: Philip Wilson is a 51 y.o. male who presents to the clinic today for:   HPI     Retina Follow Up   Patient presents with  Diabetic Retinopathy (IVE OD # 27 (04.25.23) ).  In both eyes.  This started years ago.  Duration of 10.5 weeks.  Since onset it is stable.  I, the attending physician,  performed the HPI with the patient and updated documentation appropriately.        Comments   Patient feels that the vision is the same since his last visit. He denies seeing flashes of light or floaters. His blood sugar was 223 this morning and his A1c is 7.4.      Last edited by Bernarda Caffey, MD on 10/08/2021  3:18 PM.     Patient states VA is stable    Referring physician: Lillard Anes, MD [address] [place],  [place] [address]  HISTORICAL INFORMATION:   Selected notes from the MEDICAL RECORD NUMBER Referred by Dr. Ricki Frandsen for diabetic retinopathy OU   CURRENT MEDICATIONS: No current outpatient medications on file. (Ophthalmic Drugs)   Current Facility-Administered Medications (Ophthalmic Drugs)  Medication Route   aflibercept (EYLEA) SOLN 2 mg Intravitreal   aflibercept (EYLEA) SOLN 2 mg Intravitreal   aflibercept (EYLEA) SOLN 2 mg Intravitreal   aflibercept (EYLEA) SOLN 2 mg Intravitreal   aflibercept (EYLEA) SOLN 2 mg Intravitreal   aflibercept (EYLEA) SOLN 2 mg Intravitreal   aflibercept (EYLEA) SOLN 2 mg Intravitreal   aflibercept (EYLEA) SOLN 2 mg Intravitreal   aflibercept (EYLEA) SOLN 2 mg Intravitreal   aflibercept (EYLEA) SOLN 2 mg Intravitreal   aflibercept (EYLEA) SOLN 2 mg Intravitreal   Current Outpatient Medications (Other)  Medication Sig   ACCU-CHEK GUIDE test strip TEST THREE TIMES DAILY   AgaMatrix Ultra-Thin Lancets MISC 200 each by Misc.(Non-Drug; Combo Route) route 4 times  daily. One touch delica   amLODipine (NORVASC) 2.5 MG tablet Take 2.5 mg by mouth daily.   aspirin 81 MG EC tablet Take by mouth.   atorvastatin (LIPITOR) 40 MG tablet TAKE 1 TABLET BY MOUTH DAILY (Patient taking differently: Take 40 mg by mouth 3 (three) times a week. M, W, F)   BD PEN NEEDLE NANO 2ND GEN 32G X 4 MM MISC 4 (four) times daily. as directed   Blood Glucose Monitoring Suppl (FIFTY50 GLUCOSE METER 2.0) w/Device KIT 1 each by Other route 4 times daily. Use as instructed one touch ultra   insulin aspart (NOVOLOG) 100 UNIT/ML FlexPen Inject 20 Units into the skin 3 (three) times daily with meals. Modified dose before meals based on sliding scale. Max 30 units. (Patient taking differently: Inject 20 Units into the skin 3 (three) times daily with meals. Modified dose before meals based on sliding scale. Max 30 units. Plus 10)   Insulin Glargine (BASAGLAR KWIKPEN) 100 UNIT/ML INJECT 38 UNITS UNDER SKIN EVERY NIGHT AT BEDTIME   labetalol (NORMODYNE) 200 MG tablet Take 400 mg by mouth 2 (two) times daily.   metFORMIN (GLUCOPHAGE-XR) 500 MG 24 hr tablet TAKE 2 TABLETS BY MOUTH EVERY DAY   mycophenolate (MYFORTIC) 180 MG EC tablet Take 360 mg by mouth 2 (two) times daily.   NIFEdipine (PROCARDIA-XL/NIFEDICAL-XL) 30 MG 24 hr tablet Take 30 mg by mouth daily.   pantoprazole (PROTONIX) 40  MG tablet TAKE 1 TABLET(40 MG) BY MOUTH DAILY   predniSONE (DELTASONE) 5 MG tablet Take 5 mg by mouth daily with breakfast.   tacrolimus (PROGRAF) 1 MG capsule Take by mouth.   valGANciclovir (VALCYTE) 450 MG tablet Take 900 mg by mouth daily.   Current Facility-Administered Medications (Other)  Medication Route   Bevacizumab (AVASTIN) SOLN 1.25 mg Intravitreal   Bevacizumab (AVASTIN) SOLN 1.25 mg Intravitreal   Bevacizumab (AVASTIN) SOLN 1.25 mg Intravitreal   Bevacizumab (AVASTIN) SOLN 1.25 mg Intravitreal   Bevacizumab (AVASTIN) SOLN 1.25 mg Intravitreal   Bevacizumab (AVASTIN) SOLN 1.25 mg Intravitreal    Bevacizumab (AVASTIN) SOLN 1.25 mg Intravitreal   REVIEW OF SYSTEMS: ROS   Positive for: Neurological, Genitourinary, Endocrine, Eyes Negative for: Constitutional, Gastrointestinal, Skin, Musculoskeletal, HENT, Cardiovascular, Respiratory, Psychiatric, Allergic/Imm, Heme/Lymph Last edited by Annie Paras, COT on 10/08/2021  9:09 AM.     ALLERGIES No Known Allergies  PAST MEDICAL HISTORY Past Medical History:  Diagnosis Date   Cataract    OU   Chronic kidney disease 05/04/2018   Diabetes 1.5, managed as type 2 (HCC)    Diabetic retinopathy ([place])    NPDR OU   Hypertension    Hypertensive retinopathy    OU   Past Surgical History:  Procedure Laterality Date   AV FISTULA PLACEMENT Left 09/28/2018   Procedure: Creation of Left arm Radiocephalic ARTERIOVENOUS (AV) FISTULA;  Surgeon: Marty Heck, MD;  Location: [facility] OR;  Service: Vascular;  Laterality: Left;   KIDNEY TRANSPLANT  04/02/2019   MULTIPLE TOOTH EXTRACTIONS     FAMILY HISTORY Family History  Problem Relation Age of Onset   Heart failure Mother    SOCIAL HISTORY Social History   Tobacco Use   Smoking status: Former   Smokeless tobacco: Never   Tobacco comments:    quit 20 years ago  Vaping Use   Vaping Use: Never used  Substance Use Topics   Alcohol use: Not Currently   Drug use: Never       OPHTHALMIC EXAM:  Base Eye Exam     Visual Acuity (Snellen - Linear)       Right Left   Dist [place] 20/50 20/50   Dist ph [place] 20/30 +2 20/20         Tonometry (Tonopen, 9:14 AM)       Right Left   Pressure 19 17         Pupils       Pupils Dark Light Shape React APD   Right PERRL 3 2 Round Brisk None   Left PERRL 3 2 Round Brisk None         Visual Fields       Left Right    Full Full         Extraocular Movement       Right Left    Full, Ortho Full, Ortho         Neuro/Psych     Oriented x3: Yes   Mood/Affect: Normal         Dilation     Both eyes: 1.0%  Mydriacyl, 2.5% Phenylephrine @ 9:11 AM           Slit Lamp and Fundus Exam     Slit Lamp Exam       Right Left   Lids/Lashes Dermatochalasis - upper lid, mild Meibomian gland dysfunction Dermatochalasis - upper lid, mild Meibomian gland dysfunction   Conjunctiva/Sclera White and quiet White and quiet  Cornea trace PEE trace PEE   Anterior Chamber Deep and quiet, no cell or flare Deep and quiet, no cell or flare   Iris Round and dilated to 25m Round and dilated to 768m  Lens 1+ Nuclear sclerosis, 2+ Cortical cataract 1+ Nuclear sclerosis, 2+ Cortical cataract   Anterior Vitreous Mild Vitreous syneresis, Posterior vitreous detachment, Weiss ring Mild Vitreous syneresis, old white VH inferiorly         Fundus Exam       Right Left   Disc Pink and Sharp, mild pallor Pink and Sharp, Compact   C/D Ratio 0.2 0.3   Macula good foveal reflex, +ERM greatest superior to fovea, persistent cystic changes -- slightly improved Flat, good foveal reflex, mild ERM, No heme or edema   Vessels attenuated, Tortuous mild attenuation, mild tortuousity   Periphery Attached, rare MAs - greatest posteriorly, No RT/RD Attached, rare MA           Refraction     Manifest Refraction       Sphere Cylinder Axis Dist VA   Right -1.25 +1.25 180 20/30   Left -1.75 +0.50 025 20/20           IMAGING AND PROCEDURES  Imaging and Procedures for _0 @  OCT, Retina - OU - Both Eyes       Right Eye Quality was good. Central Foveal Thickness: 303. Progression has improved. Findings include no SRF, abnormal foveal contour, retinal drusen , intraretinal hyper-reflective material, epiretinal membrane, intraretinal fluid, macular pucker (interval improvement in IRF central and temporal macula; +ERM and macular pucker).   Left Eye Quality was good. Central Foveal Thickness: 248. Progression has been stable. Findings include normal foveal contour, no IRF, no SRF, intraretinal hyper-reflective material,  epiretinal membrane (Trace cystic changes stably improved).   Notes *Images captured and stored on drive  Diagnosis / Impression:  OD: +DME -- interval improvement in IRF central and temporal macula; +ERM and macular pucker OS: NFP, no IRF/SRF -- no DME  Clinical management:  See below  Abbreviations: NFP - Normal foveal profile. CME - cystoid macular edema. PED - pigment epithelial detachment. IRF - intraretinal fluid. SRF - subretinal fluid. EZ - ellipsoid zone. ERM - epiretinal membrane. ORA - outer retinal atrophy. ORT - outer retinal tubulation. SRHM - subretinal hyper-reflective material      Intravitreal Injection, Pharmacologic Agent - OD - Right Eye       Time Out 10/08/2021. 9:55 AM. Confirmed correct patient, procedure, site, and patient consented.   Anesthesia Topical anesthesia was used. Anesthetic medications included Lidocaine 2%, Proparacaine 0.5%.   Procedure Preparation included 5% betadine to ocular surface, eyelid speculum. A (32g) needle was used.   Injection: 2 mg aflibercept 2 MG/0.05ML   Route: Intravitreal, Site: Right Eye   NDC: [id number]: [id number] date: 08/02/2022, Waste: 0 mL   Post-op Post injection exam found visual acuity of at least counting fingers. The patient tolerated the procedure well. There were no complications. The patient received written and verbal post procedure care education. Post injection medications were not given.            ASSESSMENT/PLAN:    ICD-10-CM   1. 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 Essential  hypertension  I10     3. Hypertensive retinopathy of both eyes  H35.033     4. Combined forms of age-related cataract of both eyes  H25.813       1. Severe  Non-proliferative diabetic retinopathy, both eyes  - f/u delayed to 10.5 wks from 8  - initial exam showed massive central DME OU and scattered IRH and IRMA  - FA 6.5.19 with patches of capillary nonperfusion; extensive Mas with late leakage OU; no frank NV  - FA 01.02.20, shows improvement in late leaking microaneurysms OU  - S/P IVA OS #1 (06.05.19), #2 (07.08.19), #3 (07.08.19), #4 (09.03.19)  - S/P IVA OD #1 (06.07.19), #2 (07.08.19), #3 (07.08.19)  - S/P IVE OD #1 (09.03.19), #2 (10.03.19), #3 (11.04.19), #4 (12.02.19), #5 (01.02.20), #6 (02.07.20), #7 (03.16.20), #8 (05.20.20), #9 (06.22.20), #10 (07.27.20), #11(09.09.10), # 12 (10.28.20), #13 (11.25.20), #14 (04.06.21) - sample, #15 (05.04.21) - sample, #16 (06.18.21), #17 (07.30.21), #18 (09.10.21), #19 (11.16.21), #20 (1.7.22), #21 (4.12.22), #22 (06.07.22), #23 (8.1.22), #24 (10.11.22), #25 (12.6.22), #26 (1.31.23), # 27 (04.25.23)  - S/P IVE OS #1 (10.03.19), #2 (11.04.19), #3 (12.02.19), #4 (01.02.20), #5 (02.07.20), #6 (03.16.20), #7 (05.20.20), #8 (06.22.20),#9 (07.27.20), #10 (09.09.20), #11 (10.28.20), #12 (11.25.20)  - OCT shows OD: +DME -- interval improvement in IRF central and temporal macula; +ERM and macular pucker, OS: NFP, no IRF/SRF -- no DME at 10.5 weeks  - BCVA: OD 20/30 -- stable,  OS 20/20 -- stable  - recommend IVE OD #28 today, 07.07.23 w/ f/u in 10-12 wks  - will hold off on injection OS again today -- IRF improving without therapy and BCVA 20/20  - pt in agreement  - RBA of procedure discussed, questions answered  - informed consent obtained and signed  - see procedure note -- tolerated well  - Eylea paperwork and benefits investigation started on 08.05.19 -- approved for 2022  - f/u in 10-12 weeks -- DFE/OCT/possible injection  2,3. Hypertensive retinopathy OU  - discussed importance of tight BP control  - had some Bps in hypertensive emergency range (200s / 110s)  - BP improved post kidney transplant  -  discussed likely contribution to DME  - monitor   - BP management per nephrology, PCP and cardiology            4. Combined form age-related cataract OU-   - The symptoms of cataract, surgical options, and treatments and risks were discussed with patient.  - discussed diagnosis and progression  - not yet visually significant  - monitor for now  Ophthalmic Meds Ordered this visit:  Meds ordered this encounter  Medications   aflibercept (EYLEA) SOLN 2 mg     Return for f/u 10-12 weeks, NPDR OU, DFE, OCT.  There are no Patient Instructions on file for this visit.  Explained the diagnoses, plan, and follow up with the patient and they expressed understanding.  Patient expressed understanding of the importance of proper follow up care.   This document serves as a record of services personally performed by Gardiner Sleeper, MD, PhD. It was created on their behalf by Leonie Douglas, an ophthalmic technician. The creation of this record is the provider's dictation and/or activities during the visit.    Electronically signed by: Leonie Douglas COA, 10/08/21  3:21 PM   Gardiner Sleeper, M.D., Ph.D. Diseases & Surgery of the Retina and Vitreous [facility]  I have reviewed the above documentation for accuracy and completeness, and  I agree with the above. Gardiner Sleeper, M.D., Ph.D. 10/08/21 3:21 PM   Abbreviations: M myopia (nearsighted); A astigmatism; H hyperopia (farsighted); P presbyopia; Mrx spectacle prescription;  CTL contact lenses; OD right eye; OS left eye; OU both eyes  XT exotropia; ET esotropia; PEK punctate epithelial keratitis; PEE punctate epithelial erosions; DES dry eye syndrome; MGD meibomian gland dysfunction; ATs artificial tears; PFAT's preservative free artificial tears; [place] nuclear sclerotic cataract; PSC posterior subcapsular cataract; ERM epi-retinal membrane; PVD posterior vitreous detachment; RD retinal detachment; DM diabetes mellitus; DR  diabetic retinopathy; NPDR non-proliferative diabetic retinopathy; PDR proliferative diabetic retinopathy; CSME clinically significant macular edema; DME diabetic macular edema; dbh dot blot hemorrhages; CWS cotton wool spot; POAG primary open angle glaucoma; C/D cup-to-disc ratio; HVF humphrey visual field; GVF goldmann visual field; OCT optical coherence tomography; IOP intraocular pressure; BRVO Branch retinal vein occlusion; CRVO central retinal vein occlusion; CRAO central retinal artery occlusion; BRAO branch retinal artery occlusion; RT retinal tear; SB scleral buckle; PPV pars plana vitrectomy; VH Vitreous hemorrhage; PRP panretinal laser photocoagulation; IVK intravitreal kenalog; VMT vitreomacular traction; MH Macular hole;  NVD neovascularization of the disc; NVE neovascularization elsewhere; AREDS age related eye disease study; ARMD age related macular degeneration; POAG primary open angle glaucoma; EBMD epithelial/anterior basement membrane dystrophy; ACIOL anterior chamber intraocular lens; IOL intraocular lens; PCIOL posterior chamber intraocular lens; Phaco/IOL phacoemulsification with intraocular lens placement; [place] photorefractive keratectomy; LASIK laser assisted in situ keratomileusis; HTN hypertension; DM diabetes mellitus; COPD chronic obstructive pulmonary disease

## 2021-10-08 ENCOUNTER — Ambulatory Visit (INDEPENDENT_AMBULATORY_CARE_PROVIDER_SITE_OTHER): Payer: Medicare PPO | Admitting: Ophthalmology

## 2021-10-08 ENCOUNTER — Encounter (INDEPENDENT_AMBULATORY_CARE_PROVIDER_SITE_OTHER): Payer: Self-pay | Admitting: Ophthalmology

## 2021-10-08 DIAGNOSIS — H25813 Combined forms of age-related cataract, bilateral: Secondary | ICD-10-CM

## 2021-10-08 DIAGNOSIS — E113413 Type 2 diabetes mellitus with severe nonproliferative diabetic retinopathy with macular edema, bilateral: Secondary | ICD-10-CM | POA: Diagnosis not present

## 2021-10-08 DIAGNOSIS — H35033 Hypertensive retinopathy, bilateral: Secondary | ICD-10-CM

## 2021-10-08 DIAGNOSIS — I10 Essential (primary) hypertension: Secondary | ICD-10-CM | POA: Diagnosis not present

## 2021-10-08 MED ORDER — AFLIBERCEPT 2MG/0.05ML IZ SOLN FOR KALEIDOSCOPE
2.0000 mg | INTRAVITREAL | Status: AC | PRN
  Administered 2021-10-08: 2 mg via INTRAVITREAL

## 2021-10-13 ENCOUNTER — Other Ambulatory Visit: Payer: Self-pay | Admitting: Legal Medicine

## 2021-10-13 ENCOUNTER — Telehealth: Payer: Self-pay

## 2021-10-13 ENCOUNTER — Other Ambulatory Visit: Payer: Self-pay

## 2021-10-13 DIAGNOSIS — E119 Type 2 diabetes mellitus without complications: Secondary | ICD-10-CM

## 2021-10-13 MED ORDER — TRESIBA FLEXTOUCH 100 UNIT/ML ~~LOC~~ SOPN: 48.0000 [IU] | PEN_INJECTOR | Freq: Every day | SUBCUTANEOUS | Status: DC

## 2021-10-13 MED ORDER — TRESIBA FLEXTOUCH 100 UNIT/ML ~~LOC~~ SOPN: 48.0000 [IU] | PEN_INJECTOR | Freq: Every day | SUBCUTANEOUS | 2 refills | Status: DC

## 2021-10-13 NOTE — Telephone Encounter (Signed)
Wife returning call, insurance covers; lantus solostar, toujeo max, tresiba flextouch, and levemir.  Royce Macadamia, [place] 10/13/21 11:49 AM

## 2021-10-13 NOTE — Telephone Encounter (Signed)
Wife called stating with their change in insurance that [facility] will not cover Basaglar, requesting a different rx be sent. I did advise that she contact their insurance to ask what insulins are preferred to which to agreed and stated she will call our office back with this info. She asked that I go ahead and send this message.

## 2021-10-14 NOTE — Telephone Encounter (Signed)
I left detailed message on voicemail about prescription was sent.

## 2021-12-22 ENCOUNTER — Ambulatory Visit: Payer: Medicare PPO | Admitting: Legal Medicine

## 2021-12-22 ENCOUNTER — Encounter (INDEPENDENT_AMBULATORY_CARE_PROVIDER_SITE_OTHER): Payer: Medicare PPO | Admitting: Ophthalmology

## 2021-12-23 NOTE — Progress Notes (Signed)
[facility] Note  01/04/2022     CHIEF COMPLAINT Patient presents for Retina Follow Up  HISTORY OF PRESENT ILLNESS: Philip Wilson is a 51 y.o. male who presents to the clinic today for:   HPI     Retina Follow Up   Patient presents with  Diabetic Retinopathy.  In both eyes.  This started 12.5 weeks ago.  I, the attending physician,  performed the HPI with the patient and updated documentation appropriately.        Comments   Patient here for 12 1/2 weeks retina follow up for NPDR OU. Patient states vision about the same. No eye pain.       Last edited by Bernarda Caffey, MD on 01/04/2022  2:24 PM.     Referring physician: Lillard Anes, MD [address] [place],  [place] [address]  HISTORICAL INFORMATION:   Selected notes from the MEDICAL RECORD NUMBER Referred by Dr. Ricki Arwood for diabetic retinopathy OU   CURRENT MEDICATIONS: No current outpatient medications on file. (Ophthalmic Drugs)   Current Facility-Administered Medications (Ophthalmic Drugs)  Medication Route   aflibercept (EYLEA) SOLN 2 mg Intravitreal   aflibercept (EYLEA) SOLN 2 mg Intravitreal   aflibercept (EYLEA) SOLN 2 mg Intravitreal   aflibercept (EYLEA) SOLN 2 mg Intravitreal   aflibercept (EYLEA) SOLN 2 mg Intravitreal   aflibercept (EYLEA) SOLN 2 mg Intravitreal   aflibercept (EYLEA) SOLN 2 mg Intravitreal   aflibercept (EYLEA) SOLN 2 mg Intravitreal   aflibercept (EYLEA) SOLN 2 mg Intravitreal   aflibercept (EYLEA) SOLN 2 mg Intravitreal   aflibercept (EYLEA) SOLN 2 mg Intravitreal   Current Outpatient Medications (Other)  Medication Sig   ACCU-CHEK GUIDE test strip TEST THREE TIMES DAILY   AgaMatrix Ultra-Thin Lancets MISC 200 each by Misc.(Non-Drug; Combo Route) route 4 times daily. One touch delica   amLODipine (NORVASC) 2.5 MG tablet Take 2.5 mg by mouth daily.   aspirin 81 MG EC tablet Take by mouth.   atorvastatin (LIPITOR) 40 MG  tablet TAKE 1 TABLET BY MOUTH DAILY (Patient taking differently: Take 40 mg by mouth 3 (three) times a week. M, W, F)   BD PEN NEEDLE NANO 2ND GEN 32G X 4 MM MISC 4 (four) times daily. as directed   Blood Glucose Monitoring Suppl (FIFTY50 GLUCOSE METER 2.0) w/Device KIT 1 each by Other route 4 times daily. Use as instructed one touch ultra   insulin aspart (NOVOLOG) 100 UNIT/ML FlexPen Inject 20 Units into the skin 3 (three) times daily with meals. Modified dose before meals based on sliding scale. Max 30 units. (Patient taking differently: Inject 20 Units into the skin 3 (three) times daily with meals. Modified dose before meals based on sliding scale. Max 30 units. Plus 10)   insulin degludec (TRESIBA FLEXTOUCH) 100 UNIT/ML FlexTouch Pen Inject 48 Units into the skin daily.   labetalol (NORMODYNE) 200 MG tablet Take 400 mg by mouth 2 (two) times daily.   metFORMIN (GLUCOPHAGE-XR) 500 MG 24 hr tablet TAKE 2 TABLETS BY MOUTH EVERY DAY   mycophenolate (MYFORTIC) 180 MG EC tablet Take 360 mg by mouth 2 (two) times daily.   pantoprazole (PROTONIX) 40 MG tablet TAKE 1 TABLET(40 MG) BY MOUTH DAILY   predniSONE (DELTASONE) 5 MG tablet Take 5 mg by mouth daily with breakfast.   tacrolimus (PROGRAF) 1 MG capsule Take by mouth.   NIFEdipine (PROCARDIA-XL/NIFEDICAL-XL) 30 MG 24 hr tablet Take 30 mg by mouth daily.  valGANciclovir (VALCYTE) 450 MG tablet Take 900 mg by mouth daily.   Current Facility-Administered Medications (Other)  Medication Route   Bevacizumab (AVASTIN) SOLN 1.25 mg Intravitreal   Bevacizumab (AVASTIN) SOLN 1.25 mg Intravitreal   Bevacizumab (AVASTIN) SOLN 1.25 mg Intravitreal   Bevacizumab (AVASTIN) SOLN 1.25 mg Intravitreal   Bevacizumab (AVASTIN) SOLN 1.25 mg Intravitreal   Bevacizumab (AVASTIN) SOLN 1.25 mg Intravitreal   Bevacizumab (AVASTIN) SOLN 1.25 mg Intravitreal   REVIEW OF SYSTEMS: ROS   Positive for: Neurological, Genitourinary, Endocrine, Eyes Negative for:  Constitutional, Gastrointestinal, Skin, Musculoskeletal, HENT, Cardiovascular, Respiratory, Psychiatric, Allergic/Imm, Heme/Lymph Last edited by Theodore Demark, COA on 01/04/2022  1:36 PM.     ALLERGIES No Known Allergies  PAST MEDICAL HISTORY Past Medical History:  Diagnosis Date   Cataract    OU   Chronic kidney disease 05/04/2018   Diabetes 1.5, managed as type 2 (HCC)    Diabetic retinopathy ([place])    NPDR OU   Hypertension    Hypertensive retinopathy    OU   Past Surgical History:  Procedure Laterality Date   AV FISTULA PLACEMENT Left 09/28/2018   Procedure: Creation of Left arm Radiocephalic ARTERIOVENOUS (AV) FISTULA;  Surgeon: Marty Heck, MD;  Location: [facility] OR;  Service: Vascular;  Laterality: Left;   KIDNEY TRANSPLANT  04/02/2019   MULTIPLE TOOTH EXTRACTIONS     FAMILY HISTORY Family History  Problem Relation Age of Onset   Heart failure Mother    SOCIAL HISTORY Social History   Tobacco Use   Smoking status: Former   Smokeless tobacco: Never   Tobacco comments:    quit 20 years ago  Vaping Use   Vaping Use: Never used  Substance Use Topics   Alcohol use: Not Currently   Drug use: Never       OPHTHALMIC EXAM:  Base Eye Exam     Visual Acuity (Snellen - Linear)       Right Left   Dist [place] 20/40 -2 20/30   Dist ph [place] 20/30 20/20 -2    Correction: Glasses         Tonometry (Tonopen, 1:33 PM)       Right Left   Pressure 16 16         Pupils       Dark Light Shape React APD   Right 3 2 Round Brisk None   Left 3 2 Round Brisk None         Visual Fields (Counting fingers)       Left Right    Full Full         Extraocular Movement       Right Left    Full, Ortho Full, Ortho         Neuro/Psych     Oriented x3: Yes   Mood/Affect: Normal         Dilation     Both eyes: 1.0% Mydriacyl, 2.5% Phenylephrine @ 1:33 PM           Slit Lamp and Fundus Exam     Slit Lamp Exam       Right Left    Lids/Lashes Dermatochalasis - upper lid, mild Meibomian gland dysfunction Dermatochalasis - upper lid, mild Meibomian gland dysfunction   Conjunctiva/Sclera White and quiet White and quiet   Cornea trace PEE trace PEE   Anterior Chamber Deep and quiet, no cell or flare Deep and quiet, no cell or flare   Iris Round and dilated to  37m Round and dilated to 760m  Lens 1+ Nuclear sclerosis, 2+ Cortical cataract 1+ Nuclear sclerosis, 2+ Cortical cataract   Anterior Vitreous Mild Vitreous syneresis, Posterior vitreous detachment, Weiss ring Mild Vitreous syneresis, old white VH inferiorly         Fundus Exam       Right Left   Disc Pink and Sharp Pink and Sharp, Compact   C/D Ratio 0.2 0.3   Macula good foveal reflex, +ERM greatest superior to fovea, persistent cystic changes -- slightly increased Flat, good foveal reflex, mild ERM, No heme or edema   Vessels attenuated, Tortuous mild attenuation, mild tortuousity   Periphery Attached, rare MAs - greatest posteriorly, No RT/RD Attached, rare MA           IMAGING AND PROCEDURES  Imaging and Procedures for _0 @  OCT, Retina - OU - Both Eyes       Right Eye Quality was good. Central Foveal Thickness: 310. Progression has worsened. Findings include no SRF, abnormal foveal contour, retinal drusen , intraretinal hyper-reflective material, epiretinal membrane, intraretinal fluid, macular pucker (Persistent IRF central and temporal macula -- slightly increased; +ERM and macular pucker).   Left Eye Quality was good. Central Foveal Thickness: 227. Progression has been stable. Findings include normal foveal contour, no IRF, no SRF, intraretinal hyper-reflective material, epiretinal membrane (Trace cystic changes stably improved).   Notes *Images captured and stored on drive  Diagnosis / Impression:  OD: +DME -- Persistent IRF central and temporal macula -- slightly increased; +ERM and macular pucker OS: NFP, no IRF/SRF -- no  DME  Clinical management:  See below  Abbreviations: NFP - Normal foveal profile. CME - cystoid macular edema. PED - pigment epithelial detachment. IRF - intraretinal fluid. SRF - subretinal fluid. EZ - ellipsoid zone. ERM - epiretinal membrane. ORA - outer retinal atrophy. ORT - outer retinal tubulation. SRHM - subretinal hyper-reflective material      Intravitreal Injection, Pharmacologic Agent - OD - Right Eye       Time Out 01/04/2022. 2:00 PM. Confirmed correct patient, procedure, site, and patient consented.   Anesthesia Topical anesthesia was used. Anesthetic medications included Lidocaine 2%, Proparacaine 0.5%.   Procedure Preparation included 5% betadine to ocular surface, eyelid speculum. A (32g) needle was used.   Injection: 2 mg aflibercept 2 MG/0.05ML   Route: Intravitreal, Site: Right Eye   NDC: [id number]: [id number] date: 02/02/2023, Waste: 0 mL   Post-op Post injection exam found visual acuity of at least counting fingers. The patient tolerated the procedure well. There were no complications. The patient received written and verbal post procedure care education. Post injection medications were not given.            ASSESSMENT/PLAN:    ICD-10-CM   1. 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 Essential hypertension  I10     3. Hypertensive retinopathy of both eyes  H35.033     4. Combined forms of age-related cataract of both eyes  H25.813      1. Severe Non-proliferative diabetic retinopathy, both eyes  - last A1c was 7.4 on 06.13.23  - initial exam showed massive central DME OU and scattered IRH and IRMA  - FA 6.5.19 with patches of capillary  nonperfusion; extensive Mas with late leakage OU; no frank NV  - FA  01.02.20, shows improvement in late leaking microaneurysms OU  - S/P IVA OS #1 (06.05.19), #2 (07.08.19), #3 (07.08.19), #4 (09.03.19)  - S/P IVA OD #1 (06.07.19), #2 (07.08.19), #3 (07.08.19)  - S/P IVE OD #1 (09.03.19), #2 (10.03.19), #3 (11.04.19), #4 (12.02.19), #5 (01.02.20), #6 (02.07.20), #7 (03.16.20), #8 (05.20.20), #9 (06.22.20), #10 (07.27.20), #11(09.09.10), # 12 (10.28.20), #13 (11.25.20), #14 (04.06.21) - sample, #15 (05.04.21) - sample, #16 (06.18.21), #17 (07.30.21), #18 (09.10.21), #19 (11.16.21), #20 (1.7.22), #21 (4.12.22), #22 (06.07.22), #23 (8.1.22), #24 (10.11.22), #25 (12.6.22), #26 (1.31.23), # 27 (04.25.23), #28 (07.07.23)  - S/P IVE OS #1 (10.03.19), #2 (11.04.19), #3 (12.02.19), #4 (01.02.20), #5 (02.07.20), #6 (03.16.20), #7 (05.20.20), #8 (06.22.20),#9 (07.27.20), #10 (09.09.20), #11 (10.28.20), #12 (11.25.20)  - OCT shows OD: Persistent IRF central and temporal macula -- slightly increased; +ERM and macular pucker; OS: NFP, no IRF/SRF -- no DME at 12 weeks  - BCVA: OD 20/30 -- stable,  OS 20/20 -- stable  - recommend IVE OD #29 today, 10.03.23 w/ f/u in 10-11 wks  - will hold off on injection OS again today -- BCVA 20/20 and no IRF  - pt in agreement  - RBA of procedure discussed, questions answered  - informed consent obtained and signed  - see procedure note -- tolerated well  - Eylea paperwork and benefits investigation started on 08.05.19 -- approved for 2022  - f/u in 10-11 weeks -- DFE/OCT/possible injection  2,3. Hypertensive retinopathy OU  - discussed importance of tight BP control  - had some Bps in hypertensive emergency range (200s / 110s)  - BP improved post kidney transplant  - discussed likely contribution to DME  - monitor   - BP management per nephrology, PCP and cardiology            4. Combined form age-related cataract OU-   - The symptoms of cataract, surgical options, and treatments and risks were discussed with patient.  - discussed  diagnosis and progression  - not yet visually significant  - monitor for now  Ophthalmic Meds Ordered this visit:  Meds ordered this encounter  Medications   aflibercept (EYLEA) SOLN 2 mg     Return for f/u 10-11 weeks, NPDR OU, DFE, OCT.  There are no Patient Instructions on file for this visit.  Explained the diagnoses, plan, and follow up with the patient and they expressed understanding.  Patient expressed understanding of the importance of proper follow up care.   This document serves as a record of services personally performed by Gardiner Sleeper, MD, PhD. It was created on their behalf by Renaldo Reel, [place] an ophthalmic technician. The creation of this record is the provider's dictation and/or activities during the visit.    Electronically signed by:  Renaldo Reel, COT 09.21.23  4:26 PM  This document serves as a record of services personally performed by Gardiner Sleeper, MD, PhD. It was created on their behalf by San Jetty. Owens Shark, OA an ophthalmic technician. The creation of this record is the provider's dictation and/or activities during the visit.    Electronically signed by: San Jetty. Owens Shark, [place] 10.03.2023 4:26 PM  Gardiner Sleeper, M.D., Ph.D. Diseases & Surgery of the Retina and Vitreous [facility]  I have reviewed the above documentation for accuracy and completeness, and I agree with the above. Gardiner Sleeper, M.D., Ph.D. 01/04/22 4:27 PM   Abbreviations:  M myopia (nearsighted); A astigmatism; H hyperopia (farsighted); P presbyopia; Mrx spectacle prescription;  CTL contact lenses; OD right eye; OS left eye; OU both eyes  XT exotropia; ET esotropia; PEK punctate epithelial keratitis; PEE punctate epithelial erosions; DES dry eye syndrome; MGD meibomian gland dysfunction; ATs artificial tears; PFAT's preservative free artificial tears; [place] nuclear sclerotic cataract; PSC posterior subcapsular cataract; ERM epi-retinal membrane; PVD  posterior vitreous detachment; RD retinal detachment; DM diabetes mellitus; DR diabetic retinopathy; NPDR non-proliferative diabetic retinopathy; PDR proliferative diabetic retinopathy; CSME clinically significant macular edema; DME diabetic macular edema; dbh dot blot hemorrhages; CWS cotton wool spot; POAG primary open angle glaucoma; C/D cup-to-disc ratio; HVF humphrey visual field; GVF goldmann visual field; OCT optical coherence tomography; IOP intraocular pressure; BRVO Branch retinal vein occlusion; CRVO central retinal vein occlusion; CRAO central retinal artery occlusion; BRAO branch retinal artery occlusion; RT retinal tear; SB scleral buckle; PPV pars plana vitrectomy; VH Vitreous hemorrhage; PRP panretinal laser photocoagulation; IVK intravitreal kenalog; VMT vitreomacular traction; MH Macular hole;  NVD neovascularization of the disc; NVE neovascularization elsewhere; AREDS age related eye disease study; ARMD age related macular degeneration; POAG primary open angle glaucoma; EBMD epithelial/anterior basement membrane dystrophy; ACIOL anterior chamber intraocular lens; IOL intraocular lens; PCIOL posterior chamber intraocular lens; Phaco/IOL phacoemulsification with intraocular lens placement; [place] photorefractive keratectomy; LASIK laser assisted in situ keratomileusis; HTN hypertension; DM diabetes mellitus; COPD chronic obstructive pulmonary disease

## 2021-12-25 ENCOUNTER — Other Ambulatory Visit: Payer: Self-pay | Admitting: Legal Medicine

## 2021-12-25 DIAGNOSIS — E088 Diabetes mellitus due to underlying condition with unspecified complications: Secondary | ICD-10-CM

## 2021-12-25 DIAGNOSIS — K2100 Gastro-esophageal reflux disease with esophagitis, without bleeding: Secondary | ICD-10-CM

## 2022-01-04 ENCOUNTER — Encounter (INDEPENDENT_AMBULATORY_CARE_PROVIDER_SITE_OTHER): Payer: Self-pay | Admitting: Ophthalmology

## 2022-01-04 ENCOUNTER — Ambulatory Visit (INDEPENDENT_AMBULATORY_CARE_PROVIDER_SITE_OTHER): Payer: Medicare PPO | Admitting: Ophthalmology

## 2022-01-04 DIAGNOSIS — H35033 Hypertensive retinopathy, bilateral: Secondary | ICD-10-CM

## 2022-01-04 DIAGNOSIS — I10 Essential (primary) hypertension: Secondary | ICD-10-CM | POA: Diagnosis not present

## 2022-01-04 DIAGNOSIS — H25813 Combined forms of age-related cataract, bilateral: Secondary | ICD-10-CM

## 2022-01-04 DIAGNOSIS — E113413 Type 2 diabetes mellitus with severe nonproliferative diabetic retinopathy with macular edema, bilateral: Secondary | ICD-10-CM

## 2022-01-04 MED ORDER — AFLIBERCEPT 2MG/0.05ML IZ SOLN FOR KALEIDOSCOPE
2.0000 mg | INTRAVITREAL | Status: AC | PRN
  Administered 2022-01-04: 2 mg via INTRAVITREAL

## 2022-01-07 ENCOUNTER — Other Ambulatory Visit: Payer: Self-pay | Admitting: Legal Medicine

## 2022-01-07 DIAGNOSIS — Z794 Long term (current) use of insulin: Secondary | ICD-10-CM

## 2022-01-10 ENCOUNTER — Other Ambulatory Visit: Payer: Self-pay | Admitting: Legal Medicine

## 2022-01-10 DIAGNOSIS — E119 Type 2 diabetes mellitus without complications: Secondary | ICD-10-CM

## 2022-01-24 DIAGNOSIS — Z940 Kidney transplant status: Secondary | ICD-10-CM | POA: Diagnosis not present

## 2022-02-02 DIAGNOSIS — N1831 Chronic kidney disease, stage 3a: Secondary | ICD-10-CM | POA: Diagnosis not present

## 2022-02-02 DIAGNOSIS — Z79899 Other long term (current) drug therapy: Secondary | ICD-10-CM | POA: Diagnosis not present

## 2022-02-02 DIAGNOSIS — Z940 Kidney transplant status: Secondary | ICD-10-CM | POA: Diagnosis not present

## 2022-02-02 DIAGNOSIS — E1122 Type 2 diabetes mellitus with diabetic chronic kidney disease: Secondary | ICD-10-CM | POA: Diagnosis not present

## 2022-02-02 DIAGNOSIS — I129 Hypertensive chronic kidney disease with stage 1 through stage 4 chronic kidney disease, or unspecified chronic kidney disease: Secondary | ICD-10-CM | POA: Diagnosis not present

## 2022-02-02 DIAGNOSIS — B259 Cytomegaloviral disease, unspecified: Secondary | ICD-10-CM | POA: Diagnosis not present

## 2022-02-07 DIAGNOSIS — E782 Mixed hyperlipidemia: Secondary | ICD-10-CM | POA: Insufficient documentation

## 2022-02-07 NOTE — Progress Notes (Unsigned)
Subjective:  Patient ID: Philip Wilson, male    DOB: [birth year]  Age: 51 y.o. MRN: [id number]  Chief Complaint  Patient presents with   Diabetes   Hyperlipidemia    HPI: chronic   Patient present with type 2 diabetes. Compliance with treatment has been good; patient take medicines as directed, maintains diet and exercise regimen, follows up as directed, and is keeping glucose diary. Current medicines for diabetes Novolog 20 units TID slidding scale plus 10 units, Tresiba 48 units under the skin daily, Metformin 500 mg Take 2 tablets every day. Patient performs foot exams daily and last ophthalmologic exam was scheduled. Last A1C was 7.4%.  Patient presents for follow up of hypertension.  Patient tolerating Amlodipine 5 mg daily, Aspirin 81 mg daily, Labetalol 400 mg BID, Losartan 25 mg daily well without side effects.  Patient was diagnosed with hypertension and has been treated for hypertension for 12 years.Patient is working on maintaining diet and exercise regimen and follows up as directed. Complication include Kidney transplant and Diabetes.  Patient presents with hyperlipidemia.  Compliance with treatment has been good; patient takes medicines as directed, maintains low cholesterol diet, follows up as directed, and maintains exercise regimen.  Patient is using Atorvastatin 40 mg three times per week without problems.   Patient has renal transplant and no evidence for rejection for 3 years  Current Outpatient Medications on File Prior to Visit  Medication Sig Dispense Refill   ACCU-CHEK GUIDE test strip USE THREE TIMES DAILY 100 strip 6   AgaMatrix Ultra-Thin Lancets MISC 200 each by Misc.(Non-Drug; Combo Route) route 4 times daily. One touch delica     amLODipine (NORVASC) 5 MG tablet Take 5 mg by mouth daily.     aspirin 81 MG EC tablet Take by mouth.     atorvastatin (LIPITOR) 40 MG tablet TAKE 1 TABLET BY MOUTH DAILY (Patient taking differently: Take 40 mg by mouth 3 (three)  times a week. M, W, F) 90 tablet 2   BD PEN NEEDLE NANO 2ND GEN 32G X 4 MM MISC 4 (four) times daily. as directed     Blood Glucose Monitoring Suppl (FIFTY50 GLUCOSE METER 2.0) w/Device KIT 1 each by Other route 4 times daily. Use as instructed one touch ultra     insulin aspart (NOVOLOG) 100 UNIT/ML FlexPen Inject 20 Units into the skin 3 (three) times daily with meals. Modified dose before meals based on sliding scale. Max 30 units. (Patient taking differently: Inject 20 Units into the skin 3 (three) times daily with meals. Modified dose before meals based on sliding scale. Max 30 units. Plus 10) 15 mL 6   labetalol (NORMODYNE) 200 MG tablet Take 400 mg by mouth 2 (two) times daily.     losartan (COZAAR) 25 MG tablet Take 25 mg by mouth daily.     metFORMIN (GLUCOPHAGE-XR) 500 MG 24 hr tablet TAKE 2 TABLETS BY MOUTH EVERY DAY 180 tablet 2   mycophenolate (MYFORTIC) 180 MG EC tablet Take 360 mg by mouth 2 (two) times daily.     pantoprazole (PROTONIX) 40 MG tablet TAKE 1 TABLET(40 MG) BY MOUTH DAILY 90 tablet 2   predniSONE (DELTASONE) 5 MG tablet Take 5 mg by mouth daily with breakfast.     tacrolimus (PROGRAF) 1 MG capsule Take by mouth.     TRESIBA FLEXTOUCH 100 UNIT/ML FlexTouch Pen ADMINISTER 48 UNITS UNDER THE SKIN DAILY 6 mL 2   Current Facility-Administered Medications on File Prior to Visit  Medication  Dose Route Frequency Provider Last Rate Last Admin   aflibercept (EYLEA) SOLN 2 mg  2 mg Intravitreal  Bernarda Caffey, MD   2 mg at 12/05/17 1138   aflibercept (EYLEA) SOLN 2 mg  2 mg Intravitreal  Bernarda Caffey, MD   2 mg at 01/04/18 1202   aflibercept (EYLEA) SOLN 2 mg  2 mg Intravitreal  Bernarda Caffey, MD   2 mg at 01/04/18 1203   aflibercept (EYLEA) SOLN 2 mg  2 mg Intravitreal  Bernarda Caffey, MD   2 mg at 02/05/18 0930   aflibercept (EYLEA) SOLN 2 mg  2 mg Intravitreal  Bernarda Caffey, MD   2 mg at 02/05/18 0930   aflibercept (EYLEA) SOLN 2 mg  2 mg Intravitreal  Bernarda Caffey, MD   2  mg at 03/05/18 1649   aflibercept (EYLEA) SOLN 2 mg  2 mg Intravitreal  Bernarda Caffey, MD   2 mg at 03/05/18 1649   aflibercept (EYLEA) SOLN 2 mg  2 mg Intravitreal  Bernarda Caffey, MD   2 mg at 04/05/18 2315   aflibercept (EYLEA) SOLN 2 mg  2 mg Intravitreal  Bernarda Caffey, MD   2 mg at 04/05/18 2315   aflibercept (EYLEA) SOLN 2 mg  2 mg Intravitreal  Bernarda Caffey, MD   2 mg at 05/13/18 2254   aflibercept (EYLEA) SOLN 2 mg  2 mg Intravitreal  Bernarda Caffey, MD   2 mg at 05/13/18 2255   Bevacizumab (AVASTIN) SOLN 1.25 mg  1.25 mg Intravitreal  Bernarda Caffey, MD   1.25 mg at 09/06/17 1312   Bevacizumab (AVASTIN) SOLN 1.25 mg  1.25 mg Intravitreal  Bernarda Caffey, MD   1.25 mg at 09/08/17 0933   Bevacizumab (AVASTIN) SOLN 1.25 mg  1.25 mg Intravitreal  Bernarda Caffey, MD   1.25 mg at 10/09/17 1144   Bevacizumab (AVASTIN) SOLN 1.25 mg  1.25 mg Intravitreal  Bernarda Caffey, MD   1.25 mg at 10/09/17 1144   Bevacizumab (AVASTIN) SOLN 1.25 mg  1.25 mg Intravitreal  Bernarda Caffey, MD   1.25 mg at 11/06/17 1105   Bevacizumab (AVASTIN) SOLN 1.25 mg  1.25 mg Intravitreal  Bernarda Caffey, MD   1.25 mg at 11/06/17 1105   Bevacizumab (AVASTIN) SOLN 1.25 mg  1.25 mg Intravitreal  Bernarda Caffey, MD   1.25 mg at 12/05/17 1138   Past Medical History:  Diagnosis Date   Cataract    OU   Chronic kidney disease 05/04/2018   Diabetes 1.5, managed as type 2 (Philip Wilson)    Diabetic retinopathy ([employer])    NPDR OU   Hypertension    Hypertensive retinopathy    OU   Past Surgical History:  Procedure Laterality Date   AV FISTULA PLACEMENT Left 09/28/2018   Procedure: Creation of Left arm Radiocephalic ARTERIOVENOUS (AV) FISTULA;  Surgeon: Marty Heck, MD;  Location: [facility] OR;  Service: Vascular;  Laterality: Left;   KIDNEY TRANSPLANT  04/02/2019   MULTIPLE TOOTH EXTRACTIONS      Family History  Problem Relation Age of Onset   Heart failure Mother    Social History   Socioeconomic History   Marital status:  Married    Spouse name: Not on file   Number of children: Not on file   Years of education: Not on file   Highest education level: Not on file  Occupational History   Occupation: sales  Tobacco Use   Smoking status: Former   Smokeless tobacco: Never   Tobacco  comments:    quit 20 years ago  Vaping Use   Vaping Use: Never used  Substance and Sexual Activity   Alcohol use: Not Currently   Drug use: Never   Sexual activity: Yes    Partners: Female  Other Topics Concern   Not on file  Social History Narrative   Not on file   Social Determinants of Health   Financial Resource Strain: Not on file  Food Insecurity: Not on file  Transportation Needs: Not on file  Physical Activity: Not on file  Stress: Not on file  Social Connections: Not on file    Review of Systems  Constitutional:  Negative for chills, fatigue, fever and unexpected weight change.  HENT:  Negative for congestion, ear pain, sinus pain and sore throat.   Eyes:  Negative for visual disturbance.  Respiratory:  Negative for cough and shortness of breath.   Cardiovascular:  Negative for chest pain and palpitations.  Gastrointestinal:  Negative for abdominal pain, blood in stool, constipation, diarrhea, nausea and vomiting.  Endocrine: Negative for polydipsia.  Genitourinary:  Negative for dysuria.  Musculoskeletal:  Negative for back pain.  Skin: Negative.  Negative for rash.  Neurological:  Negative for headaches.  Psychiatric/Behavioral: Negative.       Objective:  BP 130/86   Pulse 78   Temp 98.4 F (36.9 C)   Resp 14   Ht _0  (1.778 m)   Wt 233 lb (105.7 kg)   SpO2 98%   BMI 33.43 kg/m      02/08/2022    9:27 AM 09/14/2021    2:21 PM 03/23/2021   10:45 AM  BP/Weight  Systolic BP 263 785 90  Diastolic BP 86 70 70  Wt. (Lbs) 233 227 216  BMI 33.43 kg/m2 32.57 kg/m2 30.99 kg/m2    Physical Exam Vitals reviewed.  Constitutional:      General: He is not in acute distress.     Appearance: Normal appearance. He is obese.  HENT:     Head: Normocephalic.     Right Ear: Tympanic membrane normal.     Left Ear: Tympanic membrane normal.     Nose: Nose normal.     Mouth/Throat:     Mouth: Mucous membranes are moist.     Pharynx: Oropharynx is clear.  Eyes:     Extraocular Movements: Extraocular movements intact.     Conjunctiva/sclera: Conjunctivae normal.     Pupils: Pupils are equal, round, and reactive to light.  Cardiovascular:     Rate and Rhythm: Normal rate and regular rhythm.     Pulses: Normal pulses.     Heart sounds: Normal heart sounds. No murmur heard.    No gallop.  Pulmonary:     Effort: Pulmonary effort is normal. No respiratory distress.     Breath sounds: Normal breath sounds. No wheezing.  Abdominal:     General: Abdomen is flat. Bowel sounds are normal. There is no distension.     Tenderness: There is no abdominal tenderness.  Musculoskeletal:        General: Normal range of motion.     Cervical back: Normal range of motion and neck supple.     Right lower leg: No edema.     Left lower leg: No edema.  Skin:    General: Skin is warm.     Capillary Refill: Capillary refill takes less than 2 seconds.  Neurological:     General: No focal deficit present.  Mental Status: He is alert and oriented to person, place, and time. Mental status is at baseline.     Gait: Gait normal.     Deep Tendon Reflexes: Reflexes normal.     Diabetic Foot Exam - Simple   Simple Foot Form Diabetic Foot exam was performed with the following findings: Yes 02/08/2022 10:13 AM  Visual Inspection No deformities, no ulcerations, no other skin breakdown bilaterally: Yes Sensation Testing Intact to touch and monofilament testing bilaterally: Yes Pulse Check Posterior Tibialis and Dorsalis pulse intact bilaterally: Yes Comments      Lab Results  Component Value Date   WBC 7.0 09/14/2021   HGB 13.8 09/14/2021   HCT 40.7 09/14/2021   PLT 237 09/14/2021    GLUCOSE 68 (L) 09/14/2021   CHOL 129 09/14/2021   TRIG 157 (H) 09/14/2021   HDL 47 09/14/2021   LDLCALC 55 09/14/2021   ALT 34 09/14/2021   AST 28 09/14/2021   NA 141 09/14/2021   K 4.2 09/14/2021   CL 108 (H) 09/14/2021   CREATININE 1.33 (H) 09/14/2021   BUN 21 09/14/2021   CO2 19 (L) 09/14/2021   INR 1.0 05/29/2018   HGBA1C 7.4 (H) 09/14/2021      Assessment & Plan:   Problem List Items Addressed This Visit       Cardiovascular and Mediastinum   Essential hypertension - Primary   Relevant Medications   amLODipine (NORVASC) 5 MG tablet   losartan (COZAAR) 25 MG tablet   Other Relevant Orders   Comprehensive metabolic panel   CBC with Differential/Platelet     Endocrine   Insulin dependent type 2 diabetes mellitus (HCC)   Relevant Medications   losartan (COZAAR) 25 MG tablet   Other Relevant Orders   Hemoglobin A1c   Diabetic glomerulopathy (HCC)   Relevant Medications   losartan (COZAAR) 25 MG tablet   Secondary hyperparathyroidism, renal (HCC)     Other   Renal transplant recipient   BMI 33.0-33.9,adult   Mixed hyperlipidemia   Relevant Medications   amLODipine (NORVASC) 5 MG tablet   losartan (COZAAR) 25 MG tablet   Other Relevant Orders   Lipid panel  .    Orders Placed This Encounter  Procedures   Comprehensive metabolic panel   Hemoglobin A1c   Lipid panel   CBC with Differential/Platelet     Follow-up: Return in about 3 months (around 05/11/2022).  An After Visit Summary was printed and given to the patient.  Reinaldo Meeker, MD [facility] [phone]

## 2022-02-08 ENCOUNTER — Ambulatory Visit: Payer: Medicare PPO | Admitting: Legal Medicine

## 2022-02-08 ENCOUNTER — Encounter: Payer: Self-pay | Admitting: Legal Medicine

## 2022-02-08 VITALS — BP 130/86 | HR 78 | Temp 98.4°F | Resp 14 | Ht 70.0 in | Wt 233.0 lb

## 2022-02-08 DIAGNOSIS — Z794 Long term (current) use of insulin: Secondary | ICD-10-CM | POA: Diagnosis not present

## 2022-02-08 DIAGNOSIS — E1121 Type 2 diabetes mellitus with diabetic nephropathy: Secondary | ICD-10-CM

## 2022-02-08 DIAGNOSIS — N2581 Secondary hyperparathyroidism of renal origin: Secondary | ICD-10-CM | POA: Diagnosis not present

## 2022-02-08 DIAGNOSIS — E782 Mixed hyperlipidemia: Secondary | ICD-10-CM

## 2022-02-08 DIAGNOSIS — Z6833 Body mass index (BMI) 33.0-33.9, adult: Secondary | ICD-10-CM | POA: Diagnosis not present

## 2022-02-08 DIAGNOSIS — E119 Type 2 diabetes mellitus without complications: Secondary | ICD-10-CM | POA: Diagnosis not present

## 2022-02-08 DIAGNOSIS — I10 Essential (primary) hypertension: Secondary | ICD-10-CM | POA: Diagnosis not present

## 2022-02-08 DIAGNOSIS — Z940 Kidney transplant status: Secondary | ICD-10-CM

## 2022-02-09 LAB — LIPID PANEL
Chol/HDL Ratio: 3.4 ratio (ref 0.0–5.0)
Cholesterol, Total: 149 mg/dL (ref 100–199)
HDL: 44 mg/dL (ref >39)
LDL Chol Calc (NIH): 67 mg/dL (ref 0–99)
Triglycerides: 234 mg/dL — ABNORMAL HIGH (ref 0–149)
VLDL Cholesterol Cal: 38 mg/dL (ref 5–40)

## 2022-02-09 LAB — CBC WITH DIFFERENTIAL/PLATELET
Basophils Absolute: 0 10*3/uL (ref 0.0–0.2)
Basos: 1 %
EOS (ABSOLUTE): 0.2 10*3/uL (ref 0.0–0.4)
Eos: 2 %
Hematocrit: 42.7 % (ref 37.5–51.0)
Hemoglobin: 14.6 g/dL (ref 13.0–17.7)
Immature Grans (Abs): 0 10*3/uL (ref 0.0–0.1)
Immature Granulocytes: 0 %
Lymphocytes Absolute: 2.8 10*3/uL (ref 0.7–3.1)
Lymphs: 42 %
MCH: 29.2 pg (ref 26.6–33.0)
MCHC: 34.2 g/dL (ref 31.5–35.7)
MCV: 85 fL (ref 79–97)
Monocytes Absolute: 0.8 10*3/uL (ref 0.1–0.9)
Monocytes: 12 %
Neutrophils Absolute: 2.8 10*3/uL (ref 1.4–7.0)
Neutrophils: 43 %
Platelets: 258 10*3/uL (ref 150–450)
RBC: 5 x10E6/uL (ref 4.14–5.80)
RDW: 12.8 % (ref 11.6–15.4)
WBC: 6.6 10*3/uL (ref 3.4–10.8)

## 2022-02-09 LAB — COMPREHENSIVE METABOLIC PANEL
ALT: 28 IU/L (ref 0–44)
AST: 27 IU/L (ref 0–40)
Albumin/Globulin Ratio: 1.4 (ref 1.2–2.2)
Albumin: 4.3 g/dL (ref 3.8–4.9)
Alkaline Phosphatase: 72 IU/L (ref 44–121)
BUN/Creatinine Ratio: 14 (ref 9–20)
BUN: 21 mg/dL (ref 6–24)
Bilirubin Total: 1.4 mg/dL — ABNORMAL HIGH (ref 0.0–1.2)
CO2: 22 mmol/L (ref 20–29)
Calcium: 9.7 mg/dL (ref 8.7–10.2)
Chloride: 104 mmol/L (ref 96–106)
Creatinine, Ser: 1.48 mg/dL — ABNORMAL HIGH (ref 0.76–1.27)
Globulin, Total: 3.1 g/dL (ref 1.5–4.5)
Glucose: 126 mg/dL — ABNORMAL HIGH (ref 70–99)
Potassium: 4.4 mmol/L (ref 3.5–5.2)
Sodium: 139 mmol/L (ref 134–144)
Total Protein: 7.4 g/dL (ref 6.0–8.5)
eGFR: 56 mL/min/{1.73_m2} — ABNORMAL LOW (ref >59)

## 2022-02-09 LAB — HEMOGLOBIN A1C
Est. average glucose Bld gHb Est-mCnc: 154 mg/dL
Hgb A1c MFr Bld: 7 % — ABNORMAL HIGH (ref 4.8–5.6)

## 2022-02-09 LAB — CARDIOVASCULAR RISK ASSESSMENT

## 2022-02-09 NOTE — Progress Notes (Signed)
Glucose 126, kidney stage 3a mild,kidney tests normal, A1c 7.0, triglycerides 234- watch diet, CBC normal,  lp

## 2022-02-10 ENCOUNTER — Other Ambulatory Visit: Payer: Self-pay | Admitting: Legal Medicine

## 2022-02-10 DIAGNOSIS — E088 Diabetes mellitus due to underlying condition with unspecified complications: Secondary | ICD-10-CM

## 2022-02-15 ENCOUNTER — Telehealth: Payer: Self-pay

## 2022-02-15 ENCOUNTER — Ambulatory Visit: Payer: Medicare PPO

## 2022-02-15 NOTE — Telephone Encounter (Signed)
Called patient to do AWV by phone left voicemail for patient to call office back.

## 2022-02-20 ENCOUNTER — Other Ambulatory Visit: Payer: Self-pay | Admitting: Legal Medicine

## 2022-02-20 DIAGNOSIS — E119 Type 2 diabetes mellitus without complications: Secondary | ICD-10-CM

## 2022-02-21 DIAGNOSIS — Z940 Kidney transplant status: Secondary | ICD-10-CM | POA: Diagnosis not present

## 2022-03-23 NOTE — Progress Notes (Shared)
[facility] Note  04/05/2022     CHIEF COMPLAINT Patient presents for No chief complaint on file.  HISTORY OF PRESENT ILLNESS: Philip Wilson is a 51 y.o. male who presents to the clinic today for:     Referring physician: Lillard Anes, MD [address] [place],  [place] [address]  HISTORICAL INFORMATION:   Selected notes from the MEDICAL RECORD NUMBER Referred by Dr. Ricki Kaspar for diabetic retinopathy OU   CURRENT MEDICATIONS: No current outpatient medications on file. (Ophthalmic Drugs)   Current Facility-Administered Medications (Ophthalmic Drugs)  Medication Route   aflibercept (EYLEA) SOLN 2 mg Intravitreal   aflibercept (EYLEA) SOLN 2 mg Intravitreal   aflibercept (EYLEA) SOLN 2 mg Intravitreal   aflibercept (EYLEA) SOLN 2 mg Intravitreal   aflibercept (EYLEA) SOLN 2 mg Intravitreal   aflibercept (EYLEA) SOLN 2 mg Intravitreal   aflibercept (EYLEA) SOLN 2 mg Intravitreal   aflibercept (EYLEA) SOLN 2 mg Intravitreal   aflibercept (EYLEA) SOLN 2 mg Intravitreal   aflibercept (EYLEA) SOLN 2 mg Intravitreal   aflibercept (EYLEA) SOLN 2 mg Intravitreal   Current Outpatient Medications (Other)  Medication Sig   ACCU-CHEK GUIDE test strip USE THREE TIMES DAILY   AgaMatrix Ultra-Thin Lancets MISC 200 each by Misc.(Non-Drug; Combo Route) route 4 times daily. One touch delica   amLODipine (NORVASC) 5 MG tablet Take 5 mg by mouth daily.   aspirin 81 MG EC tablet Take by mouth.   atorvastatin (LIPITOR) 40 MG tablet TAKE 1 TABLET BY MOUTH DAILY (Patient taking differently: Take 40 mg by mouth 3 (three) times a week. M, W, F)   BD PEN NEEDLE NANO 2ND GEN 32G X 4 MM MISC 4 (four) times daily. as directed   Blood Glucose Monitoring Suppl (FIFTY50 GLUCOSE METER 2.0) w/Device KIT 1 each by Other route 4 times daily. Use as instructed one touch ultra   insulin aspart (NOVOLOG FLEXPEN) 100 UNIT/ML FlexPen INJECT 20 UNITS UNDER THE  SKIN THREE TIMES DAILY WITH MEALS. MODIFIED DOSE BEFORE MEALS BASED ON SLIDING SCALE. MAX 30 UNITS   labetalol (NORMODYNE) 200 MG tablet Take 400 mg by mouth 2 (two) times daily.   losartan (COZAAR) 25 MG tablet Take 25 mg by mouth daily.   metFORMIN (GLUCOPHAGE-XR) 500 MG 24 hr tablet TAKE 2 TABLETS BY MOUTH EVERY DAY   mycophenolate (MYFORTIC) 180 MG EC tablet Take 360 mg by mouth 2 (two) times daily.   pantoprazole (PROTONIX) 40 MG tablet TAKE 1 TABLET(40 MG) BY MOUTH DAILY   predniSONE (DELTASONE) 5 MG tablet Take 5 mg by mouth daily with breakfast.   tacrolimus (PROGRAF) 1 MG capsule Take by mouth.   TRESIBA FLEXTOUCH 100 UNIT/ML FlexTouch Pen ADMINISTER 48 UNITS UNDER THE SKIN DAILY   Current Facility-Administered Medications (Other)  Medication Route   Bevacizumab (AVASTIN) SOLN 1.25 mg Intravitreal   Bevacizumab (AVASTIN) SOLN 1.25 mg Intravitreal   Bevacizumab (AVASTIN) SOLN 1.25 mg Intravitreal   Bevacizumab (AVASTIN) SOLN 1.25 mg Intravitreal   Bevacizumab (AVASTIN) SOLN 1.25 mg Intravitreal   Bevacizumab (AVASTIN) SOLN 1.25 mg Intravitreal   Bevacizumab (AVASTIN) SOLN 1.25 mg Intravitreal   REVIEW OF SYSTEMS:   ALLERGIES No Known Allergies  PAST MEDICAL HISTORY Past Medical History:  Diagnosis Date   Cataract    OU   Chronic kidney disease 05/04/2018   Diabetes 1.5, managed as type 2 (HCC)    Diabetic retinopathy ([place])    NPDR OU   Hypertension  Hypertensive retinopathy    OU   Past Surgical History:  Procedure Laterality Date   AV FISTULA PLACEMENT Left 09/28/2018   Procedure: Creation of Left arm Radiocephalic ARTERIOVENOUS (AV) FISTULA;  Surgeon: Marty Heck, MD;  Location: [facility] OR;  Service: Vascular;  Laterality: Left;   KIDNEY TRANSPLANT  04/02/2019   MULTIPLE TOOTH EXTRACTIONS     FAMILY HISTORY Family History  Problem Relation Age of Onset   Heart failure Mother    SOCIAL HISTORY Social History   Tobacco Use   Smoking status: Former    Smokeless tobacco: Never   Tobacco comments:    quit 20 years ago  Vaping Use   Vaping Use: Never used  Substance Use Topics   Alcohol use: Not Currently   Drug use: Never       OPHTHALMIC EXAM:  Not recorded    IMAGING AND PROCEDURES  Imaging and Procedures for _0 @          ASSESSMENT/PLAN:    ICD-10-CM   1. Severe nonproliferative diabetic retinopathy of both eyes with macular edema associated with type 2 diabetes mellitus ([place])  G31.5176     2. Essential hypertension  I10     3. Hypertensive retinopathy of both eyes  H35.033     4. Combined forms of age-related cataract of both eyes  H25.813       1. Severe Non-proliferative diabetic retinopathy, both eyes  - last A1c was 7.4 on 06.13.23  - initial exam showed massive central DME OU and scattered IRH and IRMA  - FA 6.5.19 with patches of capillary nonperfusion; extensive Mas with late leakage OU; no frank NV  - FA 01.02.20, shows improvement in late leaking microaneurysms OU  - S/P IVA OS #1 (06.05.19), #2 (07.08.19), #3 (07.08.19), #4 (09.03.19)  - S/P IVA OD #1 (06.07.19), #2 (07.08.19), #3 (07.08.19)  - S/P IVE OD #1 (09.03.19), #2 (10.03.19), #3 (11.04.19), #4 (12.02.19), #5 (01.02.20), #6 (02.07.20), #7 (03.16.20), #8 (05.20.20), #9 (06.22.20), #10 (07.27.20), #11(09.09.10), # 12 (10.28.20), #13 (11.25.20), #14 (04.06.21) - sample, #15 (05.04.21) - sample, #16 (06.18.21), #17 (07.30.21), #18 (09.10.21), #19 (11.16.21), #20 (1.7.22), #21 (4.12.22), #22 (06.07.22), #23 (8.1.22), #24 (10.11.22), #25 (12.6.22), #26 (1.31.23), # 27 (04.25.23), #28 (07.07.23), #29 (10.03.23)  - S/P IVE OS #1 (10.03.19), #2 (11.04.19), #3 (12.02.19), #4 (01.02.20), #5 (02.07.20), #6 (03.16.20), #7 (05.20.20), #8 (06.22.20),#9 (07.27.20), #10 (09.09.20), #11 (10.28.20), #12 (11.25.20)  - OCT shows OD: Persistent IRF central and temporal macula -- slightly increased; +ERM and macular pucker; OS: NFP, no IRF/SRF -- no DME at 12  weeks  - BCVA: OD 20/30 -- stable,  OS 20/20 -- stable  - recommend IVE OD #30 today, 1.02.24 w/ f/u in 10-11 wks  - will hold off on injection OS again today -- BCVA 20/20 and no IRF  - pt in agreement  - RBA of procedure discussed, questions answered  - informed consent obtained and signed  - see procedure note -- tolerated well  - Eylea paperwork and benefits investigation started on 08.05.19 -- approved for 2022  - f/u in 10-11 weeks -- DFE/OCT/possible injection  2,3. Hypertensive retinopathy OU  - discussed importance of tight BP control  - had some Bps in hypertensive emergency range (200s / 110s)  - BP improved post kidney transplant  - discussed likely contribution to DME  - monitor   - BP management per nephrology, PCP and cardiology            4.  Combined form age-related cataract OU-   - The symptoms of cataract, surgical options, and treatments and risks were discussed with patient.  - discussed diagnosis and progression  - not yet visually significant  - monitor for now  Ophthalmic Meds Ordered this visit:  No orders of the defined types were placed in this encounter.    No follow-ups on file.  There are no Patient Instructions on file for this visit.  Explained the diagnoses, plan, and follow up with the patient and they expressed understanding.  Patient expressed understanding of the importance of proper follow up care.   This document serves as a record of services personally performed by Gardiner Sleeper, MD, PhD. It was created on their behalf by Renaldo Reel, [place] an ophthalmic technician. The creation of this record is the provider's dictation and/or activities during the visit.    Electronically signed by:  Renaldo Reel, COT 12.20.23 11:04 AM    Gardiner Sleeper, M.D., Ph.D. Diseases & Surgery of the Retina and Vitreous [facility]: M myopia (nearsighted); A astigmatism; H hyperopia (farsighted); P  presbyopia; Mrx spectacle prescription;  CTL contact lenses; OD right eye; OS left eye; OU both eyes  XT exotropia; ET esotropia; PEK punctate epithelial keratitis; PEE punctate epithelial erosions; DES dry eye syndrome; MGD meibomian gland dysfunction; ATs artificial tears; PFAT's preservative free artificial tears; [place] nuclear sclerotic cataract; PSC posterior subcapsular cataract; ERM epi-retinal membrane; PVD posterior vitreous detachment; RD retinal detachment; DM diabetes mellitus; DR diabetic retinopathy; NPDR non-proliferative diabetic retinopathy; PDR proliferative diabetic retinopathy; CSME clinically significant macular edema; DME diabetic macular edema; dbh dot blot hemorrhages; CWS cotton wool spot; POAG primary open angle glaucoma; C/D cup-to-disc ratio; HVF humphrey visual field; GVF goldmann visual field; OCT optical coherence tomography; IOP intraocular pressure; BRVO Branch retinal vein occlusion; CRVO central retinal vein occlusion; CRAO central retinal artery occlusion; BRAO branch retinal artery occlusion; RT retinal tear; SB scleral buckle; PPV pars plana vitrectomy; VH Vitreous hemorrhage; PRP panretinal laser photocoagulation; IVK intravitreal kenalog; VMT vitreomacular traction; MH Macular hole;  NVD neovascularization of the disc; NVE neovascularization elsewhere; AREDS age related eye disease study; ARMD age related macular degeneration; POAG primary open angle glaucoma; EBMD epithelial/anterior basement membrane dystrophy; ACIOL anterior chamber intraocular lens; IOL intraocular lens; PCIOL posterior chamber intraocular lens; Phaco/IOL phacoemulsification with intraocular lens placement; [place] photorefractive keratectomy; LASIK laser assisted in situ keratomileusis; HTN hypertension; DM diabetes mellitus; COPD chronic obstructive pulmonary disease

## 2022-04-01 DIAGNOSIS — D849 Immunodeficiency, unspecified: Secondary | ICD-10-CM | POA: Diagnosis not present

## 2022-04-01 DIAGNOSIS — E785 Hyperlipidemia, unspecified: Secondary | ICD-10-CM | POA: Diagnosis not present

## 2022-04-01 DIAGNOSIS — Z5181 Encounter for therapeutic drug level monitoring: Secondary | ICD-10-CM | POA: Diagnosis not present

## 2022-04-01 DIAGNOSIS — Z4822 Encounter for aftercare following kidney transplant: Secondary | ICD-10-CM | POA: Diagnosis not present

## 2022-04-01 DIAGNOSIS — I10 Essential (primary) hypertension: Secondary | ICD-10-CM | POA: Diagnosis not present

## 2022-04-01 DIAGNOSIS — E119 Type 2 diabetes mellitus without complications: Secondary | ICD-10-CM | POA: Diagnosis not present

## 2022-04-01 DIAGNOSIS — Z79621 Long term (current) use of calcineurin inhibitor: Secondary | ICD-10-CM | POA: Diagnosis not present

## 2022-04-01 DIAGNOSIS — Z794 Long term (current) use of insulin: Secondary | ICD-10-CM | POA: Diagnosis not present

## 2022-04-01 DIAGNOSIS — E875 Hyperkalemia: Secondary | ICD-10-CM | POA: Diagnosis not present

## 2022-04-01 DIAGNOSIS — E11319 Type 2 diabetes mellitus with unspecified diabetic retinopathy without macular edema: Secondary | ICD-10-CM | POA: Diagnosis not present

## 2022-04-01 DIAGNOSIS — B348 Other viral infections of unspecified site: Secondary | ICD-10-CM | POA: Diagnosis not present

## 2022-04-01 DIAGNOSIS — D110 Benign neoplasm of parotid gland: Secondary | ICD-10-CM | POA: Diagnosis not present

## 2022-04-01 DIAGNOSIS — Z862 Personal history of diseases of the blood and blood-forming organs and certain disorders involving the immune mechanism: Secondary | ICD-10-CM | POA: Diagnosis not present

## 2022-04-01 DIAGNOSIS — B9789 Other viral agents as the cause of diseases classified elsewhere: Secondary | ICD-10-CM | POA: Diagnosis not present

## 2022-04-01 DIAGNOSIS — Z940 Kidney transplant status: Secondary | ICD-10-CM | POA: Diagnosis not present

## 2022-04-05 ENCOUNTER — Encounter (INDEPENDENT_AMBULATORY_CARE_PROVIDER_SITE_OTHER): Payer: Medicare PPO | Admitting: Ophthalmology

## 2022-04-05 DIAGNOSIS — I10 Essential (primary) hypertension: Secondary | ICD-10-CM

## 2022-04-05 DIAGNOSIS — H35033 Hypertensive retinopathy, bilateral: Secondary | ICD-10-CM

## 2022-04-05 DIAGNOSIS — E113413 Type 2 diabetes mellitus with severe nonproliferative diabetic retinopathy with macular edema, bilateral: Secondary | ICD-10-CM

## 2022-04-05 DIAGNOSIS — H25813 Combined forms of age-related cataract, bilateral: Secondary | ICD-10-CM

## 2022-04-18 ENCOUNTER — Other Ambulatory Visit: Payer: Self-pay

## 2022-04-18 MED ORDER — ATORVASTATIN CALCIUM 40 MG PO TABS: 40.0000 mg | ORAL_TABLET | Freq: Every day | ORAL | 0 refills | Status: DC

## 2022-04-21 ENCOUNTER — Other Ambulatory Visit: Payer: Self-pay | Admitting: Family Medicine

## 2022-04-21 DIAGNOSIS — E119 Type 2 diabetes mellitus without complications: Secondary | ICD-10-CM

## 2022-04-26 NOTE — Progress Notes (Signed)
[facility] Note  05/10/2022     CHIEF COMPLAINT Patient presents for Retina Follow Up  HISTORY OF PRESENT ILLNESS: Philip Wilson is a 52 y.o. male who presents to the clinic today for:   HPI     Retina Follow Up   Patient presents with  Diabetic Retinopathy.  In both eyes.  This started 10 weeks ago.  I, the attending physician,  performed the HPI with the patient and updated documentation appropriately.        Comments   Patient here for 10-11 weeks (18 weeks) for retina follow up for NPDR OU. Patient state vision about the same. No eye pain.       Last edited by Bernarda Caffey, MD on 05/10/2022  5:03 PM.    Pt is delayed to follow up from 10-11 weeks to 18 weeks, he states he had to r/s bc he had to take his son to [place], no vision changes noted  Referring physician: Lillard Anes, MD No address on file  HISTORICAL INFORMATION:   Selected notes from the MEDICAL RECORD NUMBER Referred by Dr. Ricki Lepak for diabetic retinopathy OU   CURRENT MEDICATIONS: No current outpatient medications on file. (Ophthalmic Drugs)   Current Facility-Administered Medications (Ophthalmic Drugs)  Medication Route   aflibercept (EYLEA) SOLN 2 mg Intravitreal   aflibercept (EYLEA) SOLN 2 mg Intravitreal   aflibercept (EYLEA) SOLN 2 mg Intravitreal   aflibercept (EYLEA) SOLN 2 mg Intravitreal   aflibercept (EYLEA) SOLN 2 mg Intravitreal   aflibercept (EYLEA) SOLN 2 mg Intravitreal   aflibercept (EYLEA) SOLN 2 mg Intravitreal   aflibercept (EYLEA) SOLN 2 mg Intravitreal   aflibercept (EYLEA) SOLN 2 mg Intravitreal   aflibercept (EYLEA) SOLN 2 mg Intravitreal   aflibercept (EYLEA) SOLN 2 mg Intravitreal   Current Outpatient Medications (Other)  Medication Sig   AgaMatrix Ultra-Thin Lancets MISC 200 each by Misc.(Non-Drug; Combo Route) route 4 times daily. One touch delica   amLODipine (NORVASC) 5 MG tablet Take 5 mg by mouth daily.    aspirin 81 MG EC tablet Take by mouth.   atorvastatin (LIPITOR) 40 MG tablet Take 1 tablet (40 mg total) by mouth daily.   BD PEN NEEDLE NANO 2ND GEN 32G X 4 MM MISC 4 (four) times daily. as directed   Blood Glucose Monitoring Suppl (FIFTY50 GLUCOSE METER 2.0) w/Device KIT 1 each by Other route 4 times daily. Use as instructed one touch ultra   insulin aspart (NOVOLOG FLEXPEN) 100 UNIT/ML FlexPen INJECT 20 UNITS UNDER THE SKIN THREE TIMES DAILY WITH MEALS. MODIFIED DOSE BEFORE MEALS BASED ON SLIDING SCALE. MAX 30 UNITS   labetalol (NORMODYNE) 200 MG tablet Take 400 mg by mouth 2 (two) times daily.   losartan (COZAAR) 25 MG tablet Take 25 mg by mouth daily.   metFORMIN (GLUCOPHAGE-XR) 500 MG 24 hr tablet TAKE 2 TABLETS BY MOUTH EVERY DAY   mycophenolate (MYFORTIC) 180 MG EC tablet Take 360 mg by mouth 2 (two) times daily.   pantoprazole (PROTONIX) 40 MG tablet TAKE 1 TABLET(40 MG) BY MOUTH DAILY   predniSONE (DELTASONE) 5 MG tablet Take 5 mg by mouth daily with breakfast.   tacrolimus (PROGRAF) 1 MG capsule Take by mouth.   TRESIBA FLEXTOUCH 100 UNIT/ML FlexTouch Pen ADMINISTER 48 UNITS UNDER THE SKIN DAILY   ACCU-CHEK GUIDE test strip USE THREE TIMES DAILY   Current Facility-Administered Medications (Other)  Medication Route   Bevacizumab (AVASTIN) SOLN 1.25 mg Intravitreal  Bevacizumab (AVASTIN) SOLN 1.25 mg Intravitreal   Bevacizumab (AVASTIN) SOLN 1.25 mg Intravitreal   Bevacizumab (AVASTIN) SOLN 1.25 mg Intravitreal   Bevacizumab (AVASTIN) SOLN 1.25 mg Intravitreal   Bevacizumab (AVASTIN) SOLN 1.25 mg Intravitreal   Bevacizumab (AVASTIN) SOLN 1.25 mg Intravitreal   REVIEW OF SYSTEMS: ROS   Positive for: Neurological, Genitourinary, Endocrine, Eyes Negative for: Constitutional, Gastrointestinal, Skin, Musculoskeletal, HENT, Cardiovascular, Respiratory, Psychiatric, Allergic/Imm, Heme/Lymph Last edited by Theodore Demark, COA on 05/10/2022  2:10 PM.     ALLERGIES No Known  Allergies  PAST MEDICAL HISTORY Past Medical History:  Diagnosis Date   Cataract    OU   Chronic kidney disease 05/04/2018   Diabetes 1.5, managed as type 2 (HCC)    Diabetic retinopathy ([place])    NPDR OU   Hypertension    Hypertensive retinopathy    OU   Past Surgical History:  Procedure Laterality Date   AV FISTULA PLACEMENT Left 09/28/2018   Procedure: Creation of Left arm Radiocephalic ARTERIOVENOUS (AV) FISTULA;  Surgeon: Marty Heck, MD;  Location: [facility] OR;  Service: Vascular;  Laterality: Left;   KIDNEY TRANSPLANT  04/02/2019   MULTIPLE TOOTH EXTRACTIONS     FAMILY HISTORY Family History  Problem Relation Age of Onset   Heart failure Mother    SOCIAL HISTORY Social History   Tobacco Use   Smoking status: Former   Smokeless tobacco: Never   Tobacco comments:    quit 20 years ago  Vaping Use   Vaping Use: Never used  Substance Use Topics   Alcohol use: Not Currently   Drug use: Never       OPHTHALMIC EXAM:  Base Eye Exam     Visual Acuity (Snellen - Linear)       Right Left   Dist [place] 20/40 -2 20/30 -2   Dist ph [place] 20/30 -2 20/20 -2         Tonometry (Tonopen, 2:07 PM)       Right Left   Pressure 21 20         Pupils       Dark Light Shape React APD   Right 3 2 Round Brisk None   Left 3 2 Round Brisk None         Visual Fields (Counting fingers)       Left Right    Full Full         Extraocular Movement       Right Left    Full, Ortho Full, Ortho         Neuro/Psych     Oriented x3: Yes   Mood/Affect: Normal         Dilation     Both eyes: 1.0% Mydriacyl, 2.5% Phenylephrine @ 2:07 PM           Slit Lamp and Fundus Exam     Slit Lamp Exam       Right Left   Lids/Lashes Dermatochalasis - upper lid, mild Meibomian gland dysfunction Dermatochalasis - upper lid, mild Meibomian gland dysfunction   Conjunctiva/Sclera White and quiet White and quiet   Cornea trace PEE trace PEE   Anterior Chamber Deep  and quiet, no cell or flare Deep and quiet, no cell or flare   Iris Round and dilated to 47mm Round and dilated to 107mm   Lens 1+ Nuclear sclerosis, 2+ Cortical cataract 1+ Nuclear sclerosis, 2+ Cortical cataract   Anterior Vitreous Mild Vitreous syneresis, Posterior vitreous detachment, Weiss ring  Mild Vitreous syneresis, old white VH inferiorly         Fundus Exam       Right Left   Disc Pink and Sharp Pink and Sharp, Compact   C/D Ratio 0.2 0.3   Macula good foveal reflex, mild ERM greatest superior to fovea, persistent cystic changes Flat, good foveal reflex, mild ERM, No heme or edema   Vessels attenuated, Tortuous attenuated, Tortuous   Periphery Attached, rare MAs - greatest posteriorly, No RT/RD Attached, rare MA           IMAGING AND PROCEDURES  Imaging and Procedures for @TODAY @  OCT, Retina - OU - Both Eyes       Right Eye Quality was good. Central Foveal Thickness: 304. Progression has been stable. Findings include no SRF, abnormal foveal contour, retinal drusen , intraretinal hyper-reflective material, epiretinal membrane, intraretinal fluid, macular pucker (Persistent cystic changes; +ERM and macular pucker).   Left Eye Quality was good. Central Foveal Thickness: 251. Progression has been stable. Findings include normal foveal contour, no IRF, no SRF, intraretinal hyper-reflective material, epiretinal membrane (Trace cystic changes stably improved).   Notes *Images captured and stored on drive  Diagnosis / Impression:  OD: +DME -- Persistent cystic changes; +ERM and macular pucker OS: NFP, no IRF/SRF -- no DME  Clinical management:  See below  Abbreviations: NFP - Normal foveal profile. CME - cystoid macular edema. PED - pigment epithelial detachment. IRF - intraretinal fluid. SRF - subretinal fluid. EZ - ellipsoid zone. ERM - epiretinal membrane. ORA - outer retinal atrophy. ORT - outer retinal tubulation. SRHM - subretinal hyper-reflective material       Intravitreal Injection, Pharmacologic Agent - OD - Right Eye       Time Out 05/10/2022. 2:37 PM. Confirmed correct patient, procedure, site, and patient consented.   Anesthesia Topical anesthesia was used. Anesthetic medications included Lidocaine 2%, Proparacaine 0.5%.   Procedure Preparation included 5% betadine to ocular surface, eyelid speculum. A (32g) needle was used.   Injection: 2 mg aflibercept 2 MG/0.05ML   Route: Intravitreal, Site: Right Eye   NDC: [id number], Lot: [id number], Expiration date: 07/02/2023, Waste: 0 mL   Post-op Post injection exam found visual acuity of at least counting fingers. The patient tolerated the procedure well. There were no complications. The patient received written and verbal post procedure care education. Post injection medications were not given.            ASSESSMENT/PLAN:    ICD-10-CM   1. 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 Essential hypertension  I10     3. Hypertensive retinopathy of both eyes  H35.033     4. Combined forms of age-related cataract of both eyes  H25.813      1. Severe Non-proliferative diabetic retinopathy, both eyes  - delayed to f/u from 10-11 weeks to 18 weeks (10.03.23 - 02.06.24)  - last A1c was 7.0 on 11.07.23; 7.4 on 06.13.23  - initial exam showed massive central DME OU and scattered IRH and IRMA  - FA 6.5.19 with patches of capillary nonperfusion; extensive Mas with late leakage OU; no frank NV  - FA 01.02.20, shows improvement in late leaking microaneurysms OU  - S/P IVA OS #1 (06.05.19), #2 (07.08.19), #3 (  07.08.19), #4 (09.03.19)  - S/P IVA OD #1 (06.07.19), #2 (07.08.19), #3 (07.08.19)  - S/P IVE OD #1 (09.03.19), #2 (10.03.19), #3 (11.04.19), #4 (12.02.19), #5  (01.02.20), #6 (02.07.20), #7 (03.16.20), #8 (05.20.20), #9 (06.22.20), #10 (07.27.20), #11(09.09.10), # 12 (10.28.20), #13 (11.25.20), #14 (04.06.21) - sample, #15 (05.04.21) - sample, #16 (06.18.21), #17 (07.30.21), #18 (09.10.21), #19 (11.16.21), #20 (1.7.22), #21 (4.12.22), #22 (06.07.22), #23 (8.1.22), #24 (10.11.22), #25 (12.6.22), #26 (1.31.23), # 27 (04.25.23), #28 (07.07.23), #29 (10.03.23)  - S/P IVE OS #1 (10.03.19), #2 (11.04.19), #3 (12.02.19), #4 (01.02.20), #5 (02.07.20), #6 (03.16.20), #7 (05.20.20), #8 (06.22.20),#9 (07.27.20), #10 (09.09.20), #11 (10.28.20), #12 (11.25.20)  - OCT shows OD: +DME -- Persistent cystic changes; +ERM and macular pucker; OS: NFP, no IRF/SRF -- no DME at 18 weeks  - BCVA: OD 20/30 -- stable,  OS 20/20 -- stable  - recommend IVE OD #30 today, 02.06.24 w/ f/u in 10-12 wks  - will hold off on injection OS again today -- BCVA 20/20 and no IRF  - pt in agreement  - RBA of procedure discussed, questions answered  - informed consent obtained and signed  - see procedure note -- tolerated well  - Eylea paperwork and benefits investigation started on 08.05.19 -- approved for 2022  - f/u in 10-12 weeks -- DFE/OCT/possible injection  2,3. Hypertensive retinopathy OU  - discussed importance of tight BP control  - had some Bps in hypertensive emergency range (200s / 110s)  - BP improved post kidney transplant  - discussed likely contribution to DME  - monitor   - BP management per nephrology, PCP and cardiology            4. Combined form age-related cataract OU-   - The symptoms of cataract, surgical options, and treatments and risks were discussed with patient.  - discussed diagnosis and progression  - not yet visually significant  - monitor for now  Ophthalmic Meds Ordered this visit:  Meds ordered this encounter  Medications   aflibercept (EYLEA) SOLN 2 mg     Return for f/u 10-12 weeks, NPDR OU, DFE, OCT.  There are no Patient Instructions on  file for this visit.  Explained the diagnoses, plan, and follow up with the patient and they expressed understanding.  Patient expressed understanding of the importance of proper follow up care.   This document serves as a record of services personally performed by Gardiner Sleeper, MD, PhD. It was created on their behalf by Renaldo Reel, [place] an ophthalmic technician. The creation of this record is the provider's dictation and/or activities during the visit.    Electronically signed by:  Renaldo Reel, COT 01.23.24  5:05 PM  This document serves as a record of services personally performed by Gardiner Sleeper, MD, PhD. It was created on their behalf by San Jetty. Owens Shark, OA an ophthalmic technician. The creation of this record is the provider's dictation and/or activities during the visit.    Electronically signed by: San Jetty. Owens Shark, [place] 02.06.2024 5:05 PM  Gardiner Sleeper, M.D., Ph.D. Diseases & Surgery of the Retina and Vitreous [facility]  I have reviewed the above documentation for accuracy and completeness, and I agree with the above. Gardiner Sleeper, M.D., Ph.D. 05/10/22 5:07 PM   Abbreviations: M myopia (nearsighted); A astigmatism; H hyperopia (farsighted); P presbyopia; Mrx spectacle prescription;  CTL contact lenses; OD right eye; OS left eye; OU both eyes  XT exotropia; ET esotropia; PEK punctate  epithelial keratitis; PEE punctate epithelial erosions; DES dry eye syndrome; MGD meibomian gland dysfunction; ATs artificial tears; PFAT's preservative free artificial tears; [place] nuclear sclerotic cataract; PSC posterior subcapsular cataract; ERM epi-retinal membrane; PVD posterior vitreous detachment; RD retinal detachment; DM diabetes mellitus; DR diabetic retinopathy; NPDR non-proliferative diabetic retinopathy; PDR proliferative diabetic retinopathy; CSME clinically significant macular edema; DME diabetic macular edema; dbh dot blot hemorrhages; CWS cotton  wool spot; POAG primary open angle glaucoma; C/D cup-to-disc ratio; HVF humphrey visual field; GVF goldmann visual field; OCT optical coherence tomography; IOP intraocular pressure; BRVO Branch retinal vein occlusion; CRVO central retinal vein occlusion; CRAO central retinal artery occlusion; BRAO branch retinal artery occlusion; RT retinal tear; SB scleral buckle; PPV pars plana vitrectomy; VH Vitreous hemorrhage; PRP panretinal laser photocoagulation; IVK intravitreal kenalog; VMT vitreomacular traction; MH Macular hole;  NVD neovascularization of the disc; NVE neovascularization elsewhere; AREDS age related eye disease study; ARMD age related macular degeneration; POAG primary open angle glaucoma; EBMD epithelial/anterior basement membrane dystrophy; ACIOL anterior chamber intraocular lens; IOL intraocular lens; PCIOL posterior chamber intraocular lens; Phaco/IOL phacoemulsification with intraocular lens placement; [place] photorefractive keratectomy; LASIK laser assisted in situ keratomileusis; HTN hypertension; DM diabetes mellitus; COPD chronic obstructive pulmonary disease

## 2022-05-09 ENCOUNTER — Other Ambulatory Visit: Payer: Self-pay | Admitting: Family Medicine

## 2022-05-09 DIAGNOSIS — E119 Type 2 diabetes mellitus without complications: Secondary | ICD-10-CM

## 2022-05-10 ENCOUNTER — Ambulatory Visit (INDEPENDENT_AMBULATORY_CARE_PROVIDER_SITE_OTHER): Payer: BC Managed Care – PPO | Admitting: Ophthalmology

## 2022-05-10 ENCOUNTER — Encounter (INDEPENDENT_AMBULATORY_CARE_PROVIDER_SITE_OTHER): Payer: Self-pay | Admitting: Ophthalmology

## 2022-05-10 DIAGNOSIS — E113413 Type 2 diabetes mellitus with severe nonproliferative diabetic retinopathy with macular edema, bilateral: Secondary | ICD-10-CM | POA: Diagnosis not present

## 2022-05-10 DIAGNOSIS — H25813 Combined forms of age-related cataract, bilateral: Secondary | ICD-10-CM

## 2022-05-10 DIAGNOSIS — H35033 Hypertensive retinopathy, bilateral: Secondary | ICD-10-CM | POA: Diagnosis not present

## 2022-05-10 DIAGNOSIS — I10 Essential (primary) hypertension: Secondary | ICD-10-CM

## 2022-05-10 MED ORDER — AFLIBERCEPT 2MG/0.05ML IZ SOLN FOR KALEIDOSCOPE
2.0000 mg | INTRAVITREAL | Status: AC | PRN
  Administered 2022-05-10: 2 mg via INTRAVITREAL

## 2022-05-21 ENCOUNTER — Other Ambulatory Visit: Payer: Self-pay | Admitting: Family Medicine

## 2022-05-21 DIAGNOSIS — E119 Type 2 diabetes mellitus without complications: Secondary | ICD-10-CM

## 2022-05-25 NOTE — Assessment & Plan Note (Addendum)
Well controlled.  No changes to medicines. Atorvastatin 40 mg daily. Continue to work on eating a healthy diet and exercise.  Labs drawn today.

## 2022-05-25 NOTE — Assessment & Plan Note (Addendum)
Well controlled.  No changes to medicines. amlodipine 5 mg daily, Losartan 25 mg daily, Labetalol 400 mg twice a day. Wean metformin and discontinue in one month.  Continue to work on eating a healthy diet and exercise.  Labs drawn today.

## 2022-05-25 NOTE — Progress Notes (Signed)
Subjective:  Patient ID: Philip Wilson, male    DOB: October 22, 1970  Age: 52 y.o. MRN: [id number]  Chief Complaint  Patient presents with   Diabetes   Hypertension   Hyperlipidemia    HPI   Diabetes:  Complications:  HTN Glucose checking: before meals  Glucose logs: 140's-240 Hypoglycemia: none Most recent A1C: 7.0% Current medications: Tresiba 48 units daily, Metformin XR 500 mg Take 2 tablets every day, Novolog 20 units three times a day based on sliding scale. Last Eye Exam: Due Foot checks: yes Has macular degeneration  Hyperlipidemia: Current medications: Atorvastatin 40 mg daily.   Hypertension: Current medications: amlodipine 5 mg daily, Losartan 25 mg daily, Labetalol 400 mg twice a day.  GERD: Currently taking pantoprazole 40 mg daily.  Diet: Regular Exercise: None     05/26/2022    8:10 AM 03/23/2021   10:48 AM  Depression screen PHQ 2/9  Decreased Interest 0 0  Down, Depressed, Hopeless 0 0  PHQ - 2 Score 0 0  Altered sleeping 0   Tired, decreased energy 0   Change in appetite 0   Feeling bad or failure about yourself  0   Trouble concentrating 0   Moving slowly or fidgety/restless 0   Suicidal thoughts 0   PHQ-9 Score 0   Difficult doing work/chores Not difficult at all          05/29/2018    6:11 AM 09/28/2018    9:03 AM 05/26/2022    7:56 AM  Fall Risk  Falls in the past year?   0  Was there an injury with Fall?   0  Fall Risk Category Calculator   0  (RETIRED) Patient Fall Risk Level Low fall risk Low fall risk   Patient at Risk for Falls Due to   No Fall Risks  Fall risk Follow up   Falls evaluation completed      Review of Systems  Constitutional:  Negative for chills, fatigue, fever and unexpected weight change.  HENT:  Negative for congestion, ear pain, sinus pain and sore throat.   Respiratory:  Negative for cough and shortness of breath.   Cardiovascular:  Negative for chest pain and palpitations.  Gastrointestinal:   Negative for abdominal pain, blood in stool, constipation, diarrhea, nausea and vomiting.  Endocrine: Negative for polydipsia.  Genitourinary:  Negative for dysuria.  Musculoskeletal:  Negative for back pain.  Skin:  Negative for rash.  Neurological:  Negative for headaches.    Current Outpatient Medications on File Prior to Visit  Medication Sig Dispense Refill   ACCU-CHEK GUIDE test strip USE THREE TIMES DAILY 100 strip 6   AgaMatrix Ultra-Thin Lancets MISC 200 each by Misc.(Non-Drug; Combo Route) route 4 times daily. One touch delica     amLODipine (NORVASC) 5 MG tablet Take 5 mg by mouth daily.     aspirin 81 MG EC tablet Take by mouth.     atorvastatin (LIPITOR) 40 MG tablet Take 1 tablet (40 mg total) by mouth daily. 90 tablet 0   BD PEN NEEDLE NANO 2ND GEN 32G X 4 MM MISC 4 (four) times daily. as directed     Blood Glucose Monitoring Suppl (FIFTY50 GLUCOSE METER 2.0) w/Device KIT 1 each by Other route 4 times daily. Use as instructed one touch ultra     insulin aspart (NOVOLOG FLEXPEN) 100 UNIT/ML FlexPen INJECT 20 UNITS UNDER THE SKIN THREE TIMES DAILY WITH MEALS. MODIFIED DOSE BEFORE MEALS BASED ON SLIDING SCALE. MAX 30 UNITS  15 mL 3   labetalol (NORMODYNE) 200 MG tablet Take 400 mg by mouth 2 (two) times daily.     losartan (COZAAR) 25 MG tablet Take 25 mg by mouth daily.     metFORMIN (GLUCOPHAGE-XR) 500 MG 24 hr tablet TAKE 2 TABLETS BY MOUTH EVERY DAY 180 tablet 2   mycophenolate (MYFORTIC) 180 MG EC tablet Take 360 mg by mouth 2 (two) times daily.     pantoprazole (PROTONIX) 40 MG tablet TAKE 1 TABLET(40 MG) BY MOUTH DAILY 90 tablet 2   predniSONE (DELTASONE) 5 MG tablet Take 5 mg by mouth daily with breakfast.     tacrolimus (PROGRAF) 1 MG capsule Take by mouth.     Current Facility-Administered Medications on File Prior to Visit  Medication Dose Route Frequency Provider Last Rate Last Admin   aflibercept (EYLEA) SOLN 2 mg  2 mg Intravitreal  Rennis Chris, MD   2 mg at  05/13/18 2255   Bevacizumab (AVASTIN) SOLN 1.25 mg  1.25 mg Intravitreal  Rennis Chris, MD   1.25 mg at 09/06/17 1312   Past Medical History:  Diagnosis Date   Cataract    OU   Chronic kidney disease 05/04/2018   Diabetes 1.5, managed as type 2 (HCC)    Diabetic retinopathy (HCC)    NPDR OU   Hypertension    Hypertensive retinopathy    OU   Past Surgical History:  Procedure Laterality Date   AV FISTULA PLACEMENT Left 09/28/2018   Procedure: Creation of Left arm Radiocephalic ARTERIOVENOUS (AV) FISTULA;  Surgeon: Cephus Shelling, MD;  Location: [facility] OR;  Service: Vascular;  Laterality: Left;   KIDNEY TRANSPLANT  04/02/2019   MULTIPLE TOOTH EXTRACTIONS      Family History  Problem Relation Age of Onset   Heart failure Mother    Social History   Socioeconomic History   Marital status: Married    Spouse name: Not on file   Number of children: Not on file   Years of education: Not on file   Highest education level: Not on file  Occupational History   Occupation: sales  Tobacco Use   Smoking status: Former   Smokeless tobacco: Never   Tobacco comments:    quit 20 years ago  Vaping Use   Vaping Use: Never used  Substance and Sexual Activity   Alcohol use: Not Currently   Drug use: Never   Sexual activity: Yes    Partners: Female  Other Topics Concern   Not on file  Social History Narrative   Not on file   Social Determinants of Health   Financial Resource Strain: Low Risk  (05/26/2022)   Overall Financial Resource Strain (CARDIA)    Difficulty of Paying Living Expenses: Not hard at all  Food Insecurity: No Food Insecurity (05/26/2022)   Hunger Vital Sign    Worried About Running Out of Food in the Last Year: Never true    Ran Out of Food in the Last Year: Never true  Transportation Needs: No Transportation Needs (05/26/2022)   PRAPARE - Transportation    Lack of Transportation (Medical): No    Lack of Transportation (Non-Medical): No  Physical Activity:  Inactive (05/26/2022)   Exercise Vital Sign    Days of Exercise per Week: 0 days    Minutes of Exercise per Session: 0 min  Stress: No Stress Concern Present (05/26/2022)   [employer] of Occupational Health - Occupational Stress Questionnaire    Feeling of Stress : Not at  all  Social Connections: Moderately Integrated (05/26/2022)   Social Connection and Isolation Panel [NHANES]    Frequency of Communication with Friends and Family: Twice a week    Frequency of Social Gatherings with Friends and Family: Twice a week    Attends Religious Services: 1 to 4 times per year    Active Member of [employer] or Organizations: No    Attends Engineer, structural: Never    Marital Status: Married    Objective:  BP 128/84   Pulse 82   Temp 98.4 F (36.9 C)   Ht 5\' 10"  (1.778 m)   Wt 232 lb (105.2 kg)   SpO2 98%   BMI 33.29 kg/m      05/26/2022    7:48 AM 02/08/2022    9:27 AM 09/14/2021    2:21 PM  BP/Weight  Systolic BP 128 130 130  Diastolic BP 84 86 70  Wt. (Lbs) 232 233 227  BMI 33.29 kg/m2 33.43 kg/m2 32.57 kg/m2    Physical Exam Vitals reviewed.  Constitutional:      Appearance: He is normal weight.  Cardiovascular:     Rate and Rhythm: Normal rate and regular rhythm.     Heart sounds: No murmur heard. Pulmonary:     Effort: Pulmonary effort is normal.     Breath sounds: Normal breath sounds.  Abdominal:     General: Abdomen is flat. Bowel sounds are normal.     Palpations: Abdomen is soft.     Tenderness: There is no abdominal tenderness.  Neurological:     Mental Status: He is alert and oriented to person, place, and time.  Psychiatric:        Mood and Affect: Mood normal.        Behavior: Behavior normal.     Diabetic Foot Exam - Simple   Simple Foot Form Diabetic Foot exam was performed with the following findings: Yes 05/26/2022  7:59 AM  Visual Inspection No deformities, no ulcerations, no other skin breakdown bilaterally: Yes Sensation  Testing Intact to touch and monofilament testing bilaterally: Yes Pulse Check Posterior Tibialis and Dorsalis pulse intact bilaterally: Yes Comments      Lab Results  Component Value Date   WBC 6.7 05/26/2022   HGB 13.4 05/26/2022   HCT 41.5 05/26/2022   PLT 246 05/26/2022   GLUCOSE 131 (H) 05/26/2022   CHOL 142 05/26/2022   TRIG 214 (H) 05/26/2022   HDL 39 (L) 05/26/2022   LDLCALC 68 05/26/2022   ALT 22 05/26/2022   AST 20 05/26/2022   NA 139 05/26/2022   K 4.4 05/26/2022   CL 103 05/26/2022   CREATININE 1.56 (H) 05/26/2022   BUN 23 05/26/2022   CO2 21 05/26/2022   INR 1.0 05/29/2018   HGBA1C 7.2 (H) 05/26/2022      Assessment & Plan:    Diabetic glomerulopathy (HCC) Assessment & Plan: Control:  Recommend check sugars fasting daily. Recommend check feet daily. Recommend annual eye exams. Medicines: Continue medications. Add ozempic 0.25 mg weekly x 4 weeks, then increase to 0.5 mg weekly. Prescription for 1 mg weekly sent.  Decrease metformin take 1 daily x 4 weeks then discontinue. Continue Tresiba 48 units daily, Novolog 20 units three times a day based on sliding scale. Continue to work on eating a healthy diet and exercise.  Labs drawn today.     Orders: -     Microalbumin / creatinine urine ratio -     Hemoglobin A1c -  Semaglutide (1 MG/DOSE); Inject 1 mg as directed once a week.  Dispense: 3 mL; Refill: 0 -     Tresiba FlexTouch; Inject 48 Units into the skin daily.  Dispense: 9 mL; Refill: 1  Mixed hyperlipidemia Assessment & Plan: Well controlled.  No changes to medicines. Atorvastatin 40 mg daily. Continue to work on eating a healthy diet and exercise.  Labs drawn today.    Orders: -     Lipid panel  Screening for colon cancer -     Cologuard  Hypertensive kidney disease, benign, stage 1-4 or unspecified chronic kidney disease Assessment & Plan: Well controlled.  No changes to medicines. amlodipine 5 mg daily, Losartan 25 mg daily,  Labetalol 400 mg twice a day. Wean metformin and discontinue in one month.  Continue to work on eating a healthy diet and exercise.  Labs drawn today.    Orders: -     Comprehensive metabolic panel -     CBC with Differential/Platelet  Living-donor kidney transplant recipient Assessment & Plan: Continue prograf and prednisone.  Management per specialist.     Chronic kidney disease, stage 3a (HCC) Assessment & Plan: Stable.   Other orders -     Cardiovascular Risk Assessment     Meds ordered this encounter  Medications   Semaglutide, 1 MG/DOSE, 4 MG/3ML SOPN    Sig: Inject 1 mg as directed once a week.    Dispense:  3 mL    Refill:  0   insulin degludec (TRESIBA FLEXTOUCH) 200 UNIT/ML FlexTouch Pen    Sig: Inject 48 Units into the skin daily.    Dispense:  9 mL    Refill:  1    Please cancel previous prescription for tresiba 100U/ml. Dr. Sedalia Muta    Orders Placed This Encounter  Procedures   Comprehensive metabolic panel   Lipid panel   CBC with Differential/Platelet   Microalbumin / creatinine urine ratio   Hemoglobin A1c   Cologuard   Cardiovascular Risk Assessment     Follow-up: Return in about 3 months (around 08/24/2022) for chronic, fasting.   I,Marla I Leal-Borjas,acting as a scribe for Blane Ohara, MD.,have documented all relevant documentation on the behalf of Blane Ohara, MD,as directed by  Blane Ohara, MD while in the presence of Blane Ohara, MD.   An After Visit Summary was printed and given to the patient.  Blane Ohara, MD [facility] [phone]

## 2022-05-25 NOTE — Assessment & Plan Note (Addendum)
Control:  Recommend check sugars fasting daily. Recommend check feet daily. Recommend annual eye exams. Medicines: Continue medications. Add ozempic 0.25 mg weekly x 4 weeks, then increase to 0.5 mg weekly. Prescription for 1 mg weekly sent.  Decrease metformin take 1 daily x 4 weeks then discontinue. Continue Tresiba 48 units daily, Novolog 20 units three times a day based on sliding scale. Continue to work on eating a healthy diet and exercise.  Labs drawn today.

## 2022-05-26 ENCOUNTER — Ambulatory Visit: Payer: BC Managed Care – PPO | Admitting: Family Medicine

## 2022-05-26 VITALS — BP 128/84 | HR 82 | Temp 98.4°F | Ht 70.0 in | Wt 232.0 lb

## 2022-05-26 DIAGNOSIS — E119 Type 2 diabetes mellitus without complications: Secondary | ICD-10-CM

## 2022-05-26 DIAGNOSIS — Z1211 Encounter for screening for malignant neoplasm of colon: Secondary | ICD-10-CM | POA: Diagnosis not present

## 2022-05-26 DIAGNOSIS — I10 Essential (primary) hypertension: Secondary | ICD-10-CM

## 2022-05-26 DIAGNOSIS — E1121 Type 2 diabetes mellitus with diabetic nephropathy: Secondary | ICD-10-CM | POA: Diagnosis not present

## 2022-05-26 DIAGNOSIS — I129 Hypertensive chronic kidney disease with stage 1 through stage 4 chronic kidney disease, or unspecified chronic kidney disease: Secondary | ICD-10-CM

## 2022-05-26 DIAGNOSIS — E782 Mixed hyperlipidemia: Secondary | ICD-10-CM | POA: Diagnosis not present

## 2022-05-26 DIAGNOSIS — N1831 Chronic kidney disease, stage 3a: Secondary | ICD-10-CM

## 2022-05-26 DIAGNOSIS — Z940 Kidney transplant status: Secondary | ICD-10-CM

## 2022-05-26 MED ORDER — TRESIBA FLEXTOUCH 200 UNIT/ML ~~LOC~~ SOPN: 48.0000 [IU] | PEN_INJECTOR | Freq: Every day | SUBCUTANEOUS | 1 refills | Status: DC

## 2022-05-26 MED ORDER — SEMAGLUTIDE (1 MG/DOSE) 4 MG/3ML ~~LOC~~ SOPN: 1.0000 mg | PEN_INJECTOR | SUBCUTANEOUS | 0 refills | Status: DC

## 2022-05-26 NOTE — Patient Instructions (Signed)
Add ozempic 0.25 mg weekly. Will titrate up every 4 weeks. Decrease metformin take 1 daily x 4 weeks then discontinue

## 2022-05-27 LAB — COMPREHENSIVE METABOLIC PANEL
ALT: 22 IU/L (ref 0–44)
AST: 20 IU/L (ref 0–40)
Albumin/Globulin Ratio: 1.5 (ref 1.2–2.2)
Albumin: 4.4 g/dL (ref 3.8–4.9)
Alkaline Phosphatase: 83 IU/L (ref 44–121)
BUN/Creatinine Ratio: 15 (ref 9–20)
BUN: 23 mg/dL (ref 6–24)
Bilirubin Total: 1.3 mg/dL — ABNORMAL HIGH (ref 0.0–1.2)
CO2: 21 mmol/L (ref 20–29)
Calcium: 9.9 mg/dL (ref 8.7–10.2)
Chloride: 103 mmol/L (ref 96–106)
Creatinine, Ser: 1.56 mg/dL — ABNORMAL HIGH (ref 0.76–1.27)
Globulin, Total: 2.9 g/dL (ref 1.5–4.5)
Glucose: 131 mg/dL — ABNORMAL HIGH (ref 70–99)
Potassium: 4.4 mmol/L (ref 3.5–5.2)
Sodium: 139 mmol/L (ref 134–144)
Total Protein: 7.3 g/dL (ref 6.0–8.5)
eGFR: 53 mL/min/{1.73_m2} — ABNORMAL LOW (ref >59)

## 2022-05-27 LAB — CBC WITH DIFFERENTIAL/PLATELET
Basophils Absolute: 0.1 x10E3/uL (ref 0.0–0.2)
Basos: 1 %
EOS (ABSOLUTE): 0.1 x10E3/uL (ref 0.0–0.4)
Eos: 2 %
Hematocrit: 41.5 % (ref 37.5–51.0)
Hemoglobin: 13.4 g/dL (ref 13.0–17.7)
Immature Grans (Abs): 0 x10E3/uL (ref 0.0–0.1)
Immature Granulocytes: 0 %
Lymphocytes Absolute: 2.8 x10E3/uL (ref 0.7–3.1)
Lymphs: 41 %
MCH: 27.1 pg (ref 26.6–33.0)
MCHC: 32.3 g/dL (ref 31.5–35.7)
MCV: 84 fL (ref 79–97)
Monocytes Absolute: 0.8 x10E3/uL (ref 0.1–0.9)
Monocytes: 12 %
Neutrophils Absolute: 3 x10E3/uL (ref 1.4–7.0)
Neutrophils: 44 %
Platelets: 246 x10E3/uL (ref 150–450)
RBC: 4.95 x10E6/uL (ref 4.14–5.80)
RDW: 12.5 % (ref 11.6–15.4)
WBC: 6.7 x10E3/uL (ref 3.4–10.8)

## 2022-05-27 LAB — HEMOGLOBIN A1C
Est. average glucose Bld gHb Est-mCnc: 160 mg/dL
Hgb A1c MFr Bld: 7.2 % — ABNORMAL HIGH (ref 4.8–5.6)

## 2022-05-27 LAB — LIPID PANEL
Chol/HDL Ratio: 3.6 ratio (ref 0.0–5.0)
Cholesterol, Total: 142 mg/dL (ref 100–199)
HDL: 39 mg/dL — ABNORMAL LOW (ref >39)
LDL Chol Calc (NIH): 68 mg/dL (ref 0–99)
Triglycerides: 214 mg/dL — ABNORMAL HIGH (ref 0–149)
VLDL Cholesterol Cal: 35 mg/dL (ref 5–40)

## 2022-05-27 LAB — MICROALBUMIN / CREATININE URINE RATIO
Creatinine, Urine: 147.9 mg/dL
Microalb/Creat Ratio: 4 mg/g{creat} (ref 0–29)
Microalbumin, Urine: 5.6 ug/mL

## 2022-05-28 NOTE — Assessment & Plan Note (Signed)
Add ozempic 0.25 mg weekly. Will titrate up every 4 weeks. Decrease metformin take 1 daily x 4 weeks then discontinue

## 2022-05-29 ENCOUNTER — Encounter: Payer: Self-pay | Admitting: Family Medicine

## 2022-05-29 DIAGNOSIS — N1831 Chronic kidney disease, stage 3a: Secondary | ICD-10-CM

## 2022-05-29 DIAGNOSIS — Z1211 Encounter for screening for malignant neoplasm of colon: Secondary | ICD-10-CM | POA: Insufficient documentation

## 2022-05-29 HISTORY — DX: Chronic kidney disease, stage 3a: N18.31

## 2022-05-29 NOTE — Assessment & Plan Note (Signed)
Stable. 

## 2022-05-29 NOTE — Assessment & Plan Note (Signed)
Continue prograf and prednisone.  Management per specialist.

## 2022-05-29 NOTE — Progress Notes (Signed)
Kidney function abnormal. Liver function normal. Triglycerides elevated but slightly improved.  Recommend Vascepa 1 g 2 caps twice daily LDL and HDL good. A1c 7.2.  Goal less than 6.5.  Changes to medications recommended at her appointment. Blood count normal. Not spilling protein in his urine.

## 2022-05-30 ENCOUNTER — Other Ambulatory Visit: Payer: Self-pay

## 2022-06-15 ENCOUNTER — Other Ambulatory Visit: Payer: Self-pay

## 2022-06-15 MED ORDER — ICOSAPENT ETHYL 1 G PO CAPS: 2.0000 g | ORAL_CAPSULE | Freq: Two times a day (BID) | ORAL | 0 refills | Status: DC

## 2022-06-16 ENCOUNTER — Telehealth: Payer: Self-pay

## 2022-06-16 NOTE — Telephone Encounter (Signed)
PA submitted and approved via covermymeds for vascepa. 

## 2022-06-16 NOTE — Telephone Encounter (Signed)
PA for vascepa submitted and approved via covermymeds.

## 2022-07-26 ENCOUNTER — Other Ambulatory Visit: Payer: Self-pay | Admitting: Family Medicine

## 2022-07-26 DIAGNOSIS — E1121 Type 2 diabetes mellitus with diabetic nephropathy: Secondary | ICD-10-CM

## 2022-07-26 NOTE — Progress Notes (Signed)
[facility] - Clinic Note  08/09/2022     CHIEF COMPLAINT Patient presents for Retina Follow Up  HISTORY OF PRESENT ILLNESS: Philip Wilson is a 52 y.o. male who presents to the clinic today for:   HPI     Retina Follow Up   Patient presents with  Diabetic Retinopathy.  In both eyes.  This started 12 weeks ago.  I, the attending physician,  performed the HPI with the patient and updated documentation appropriately.        Comments   Patient here for 1 weeks retina follow up for NPDR OU. Patient states vision about the same. No eye pain.       Last edited by Rennis Chris, MD on 08/09/2022  4:56 PM.     Patient states that he is doing well and the vision is stable.   Referring physician: Blane Ohara, MD [address] [place],  [place] [address]  HISTORICAL INFORMATION:   Selected notes from the MEDICAL RECORD NUMBER Referred by Dr. Tomma Lightning for diabetic retinopathy OU   CURRENT MEDICATIONS: No current outpatient medications on file. (Ophthalmic Drugs)   Current Facility-Administered Medications (Ophthalmic Drugs)  Medication Route   aflibercept (EYLEA) SOLN 2 mg Intravitreal   Current Outpatient Medications (Other)  Medication Sig   ACCU-CHEK GUIDE test strip USE THREE TIMES DAILY   AgaMatrix Ultra-Thin Lancets MISC 200 each by Misc.(Non-Drug; Combo Route) route 4 times daily. One touch delica   amLODipine (NORVASC) 5 MG tablet Take 5 mg by mouth daily.   aspirin 81 MG EC tablet Take by mouth.   atorvastatin (LIPITOR) 40 MG tablet Take 1 tablet (40 mg total) by mouth daily.   BD PEN NEEDLE NANO 2ND GEN 32G X 4 MM MISC 4 (four) times daily. as directed   Blood Glucose Monitoring Suppl (FIFTY50 GLUCOSE METER 2.0) w/Device KIT 1 each by Other route 4 times daily. Use as instructed one touch ultra   icosapent Ethyl (VASCEPA) 1 g capsule Take 2 capsules (2 g total) by mouth 2 (two) times daily.   insulin aspart (NOVOLOG FLEXPEN) 100  UNIT/ML FlexPen INJECT 20 UNITS UNDER THE SKIN THREE TIMES DAILY WITH MEALS. MODIFIED DOSE BEFORE MEALS BASED ON SLIDING SCALE. MAX 30 UNITS   insulin degludec (TRESIBA FLEXTOUCH) 200 UNIT/ML FlexTouch Pen Inject 48 Units into the skin daily.   labetalol (NORMODYNE) 200 MG tablet Take 400 mg by mouth 2 (two) times daily.   losartan (COZAAR) 25 MG tablet Take 25 mg by mouth daily.   metFORMIN (GLUCOPHAGE-XR) 500 MG 24 hr tablet TAKE 2 TABLETS BY MOUTH EVERY DAY   mycophenolate (MYFORTIC) 180 MG EC tablet Take 360 mg by mouth 2 (two) times daily.   OZEMPIC, 1 MG/DOSE, 4 MG/3ML SOPN INJECT 1 MG UNDER THE SKIN ONCE WEEKLY AS DIRECTED   pantoprazole (PROTONIX) 40 MG tablet TAKE 1 TABLET(40 MG) BY MOUTH DAILY   predniSONE (DELTASONE) 5 MG tablet Take 5 mg by mouth daily with breakfast.   tacrolimus (PROGRAF) 1 MG capsule Take by mouth.   Current Facility-Administered Medications (Other)  Medication Route   Bevacizumab (AVASTIN) SOLN 1.25 mg Intravitreal   REVIEW OF SYSTEMS: ROS   Positive for: Neurological, Genitourinary, Endocrine, Eyes Negative for: Constitutional, Gastrointestinal, Skin, Musculoskeletal, HENT, Cardiovascular, Respiratory, Psychiatric, Allergic/Imm, Heme/Lymph Last edited by Laddie Aquas, COA on 08/09/2022  1:42 PM.      ALLERGIES No Known Allergies  PAST MEDICAL HISTORY Past Medical History:  Diagnosis  Date   Cataract    OU   Chronic kidney disease 05/04/2018   Diabetes 1.5, managed as type 2 (HCC)    Diabetic retinopathy (HCC)    NPDR OU   Hypertension    Hypertensive retinopathy    OU   Past Surgical History:  Procedure Laterality Date   AV FISTULA PLACEMENT Left 09/28/2018   Procedure: Creation of Left arm Radiocephalic ARTERIOVENOUS (AV) FISTULA;  Surgeon: Cephus Shelling, MD;  Location: [facility] OR;  Service: Vascular;  Laterality: Left;   KIDNEY TRANSPLANT  04/02/2019   MULTIPLE TOOTH EXTRACTIONS     FAMILY HISTORY Family History  Problem Relation  Age of Onset   Heart failure Mother    SOCIAL HISTORY Social History   Tobacco Use   Smoking status: Former   Smokeless tobacco: Never   Tobacco comments:    quit 20 years ago  Vaping Use   Vaping Use: Never used  Substance Use Topics   Alcohol use: Not Currently   Drug use: Never       OPHTHALMIC EXAM:  Base Eye Exam     Visual Acuity (Snellen - Linear)       Right Left   Dist cc 20/25 -2 20/30 +1   Dist ph cc NI 20/20 -1    Correction: Glasses         Tonometry (Tonopen, 1:40 PM)       Right Left   Pressure 15 16         Pupils       Dark Light Shape React APD   Right 3 2 Round Brisk None   Left 3 2 Round Brisk None         Visual Fields (Counting fingers)       Left Right    Full Full         Extraocular Movement       Right Left    Full, Ortho Full, Ortho         Neuro/Psych     Oriented x3: Yes   Mood/Affect: Normal         Dilation     Both eyes: 1.0% Mydriacyl, 2.5% Phenylephrine @ 1:40 PM           Slit Lamp and Fundus Exam     Slit Lamp Exam       Right Left   Lids/Lashes Dermatochalasis - upper lid, mild Meibomian gland dysfunction Dermatochalasis - upper lid, mild Meibomian gland dysfunction   Conjunctiva/Sclera White and quiet White and quiet   Cornea trace PEE trace PEE   Anterior Chamber Deep and quiet, no cell or flare Deep and quiet, no cell or flare   Iris Round and dilated to 7mm Round and dilated to 7mm   Lens 1+ Nuclear sclerosis, 2+ Cortical cataract 1+ Nuclear sclerosis, 2+ Cortical cataract   Anterior Vitreous Mild Vitreous syneresis, Posterior vitreous detachment, Weiss ring Mild Vitreous syneresis, old white VH inferiorly         Fundus Exam       Right Left   Disc Pink and Sharp Pink and Sharp, Compact   C/D Ratio 0.2 0.3   Macula good foveal reflex, mild ERM greatest superior to fovea, cystic changes-- improved Flat, good foveal reflex, mild ERM, No heme or edema   Vessels attenuated,  Tortuous attenuated, Tortuous   Periphery Attached, rare MAs - greatest posteriorly, No RT/RD Attached, rare MA  IMAGING AND PROCEDURES  Imaging and Procedures for @TODAY @  OCT, Retina - OU - Both Eyes       Right Eye Quality was good. Central Foveal Thickness: 278. Progression has improved. Findings include no SRF, abnormal foveal contour, retinal drusen , intraretinal hyper-reflective material, epiretinal membrane, intraretinal fluid, macular pucker (Interval improvement in cystic changes and edema; +ERM and macular pucker).   Left Eye Quality was good. Central Foveal Thickness: 245. Progression has been stable. Findings include normal foveal contour, no IRF, no SRF, intraretinal hyper-reflective material, epiretinal membrane (Trace cystic changes stably improved).   Notes *Images captured and stored on drive  Diagnosis / Impression:  OD: +DME -- Interval improvement in cystic changes and edema; +ERM and macular pucker OS: NFP, no IRF/SRF -- no DME  Clinical management:  See below  Abbreviations: NFP - Normal foveal profile. CME - cystoid macular edema. PED - pigment epithelial detachment. IRF - intraretinal fluid. SRF - subretinal fluid. EZ - ellipsoid zone. ERM - epiretinal membrane. ORA - outer retinal atrophy. ORT - outer retinal tubulation. SRHM - subretinal hyper-reflective material      Intravitreal Injection, Pharmacologic Agent - OD - Right Eye       Time Out 08/09/2022. 2:09 PM. Confirmed correct patient, procedure, site, and patient consented.   Anesthesia Topical anesthesia was used. Anesthetic medications included Lidocaine 2%, Proparacaine 0.5%.   Procedure Preparation included 5% betadine to ocular surface, eyelid speculum. A (32g) needle was used.   Injection: 2 mg aflibercept 2 MG/0.05ML   Route: Intravitreal, Site: Right Eye   NDC: L6038910, Lot: [id number], Expiration date: 07/31/2023, Waste: 0 mL   Post-op Post injection exam found  visual acuity of at least counting fingers. The patient tolerated the procedure well. There were no complications. The patient received written and verbal post procedure care education. Post injection medications were not given.             ASSESSMENT/PLAN:    ICD-10-CM   1. 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 Current use of insulin (HCC)  Z79.4     3. Long-term (current) use of injectable non-insulin antidiabetic drugs  Z79.85     4. Long term (current) use of oral hypoglycemic drugs  Z79.84     5. Essential hypertension  I10     6. Hypertensive retinopathy of both eyes  H35.033     7. Combined forms of age-related cataract of both eyes  H25.813      1-4. Severe Non-proliferative diabetic retinopathy, both eyes  - delayed f/u -- 13 wks instead of 10-12  - delayed to f/u from 10-11 weeks to 18 weeks (10.03.23 - 02.06.24)  - last A1c was 7.2 on 02.22.24; 7.0 on 11.07.23; 7.4 on 06.13.23  - initial exam showed massive central DME OU and scattered IRH and IRMA - FA 6.5.19 with patches of capillary nonperfusion; extensive Mas with late leakage OU; no frank NV  - FA 01.02.20, shows improvement in late leaking microaneurysms OU - S/P IVA OD #1 (06.07.19), #2 (07.08.19), #3 (07.08.19) - S/P IVE OD #1 (09.03.19), #2 (10.03.19), #3 (11.04.19), #4 (12.02.19), #5 (01.02.20), #6 (02.07.20), #7 (03.16.20), #8 (05.20.20), #9 (06.22.20), #10 (07.27.20), #11(09.09.10), #12 (10.28.20), #13 (11.25.20), #14 (04.06.21) - sample, #15 (05.04.21) -  sample, #16 (06.18.21), #17 (07.30.21), #18 (09.10.21), #19 (11.16.21), #20 (1.7.22), #21 (4.12.22), #22 (06.07.22), #23 (8.1.22), #24 (10.11.22), #25 (12.6.22), #26 (1.31.23), # 27 (04.25.23), #28 (07.07.23), #29 (10.03.23), #30  (02.06.24) ==========================================================  - S/P IVA OS #1 (06.05.19), #2 (07.08.19), #3 (07.08.19), #4 (09.03.19) - S/P IVE OS #1 (10.03.19), #2 (11.04.19), #3 (12.02.19), #4 (01.02.20), #5 (02.07.20), #6 (03.16.20), #7 (05.20.20), #8 (06.22.20),#9 (07.27.20), #10 (09.09.20), #11 (10.28.20), #12 (11.25.20) =========================================================== - OCT shows BZ:JIRCVELF improvement in cystic changes and edema; +ERM and macular pucker at 13 weeks; OS: NFP, no IRF/SRF -- no DME  - BCVA: OD 20/25, OS 20/20 -- stable  - recommend IVE OD #31 today, 05.07.24 w/ f/u in 10-12 wks  - will hold off on injection OS again today -- BCVA 20/20 and no IRF  - pt in agreement  - RBA of procedure discussed, questions answered  - informed consent obtained and signed  - see procedure note -- tolerated well - Eylea paperwork and benefits investigation started on 08.05.19 -- approved for 2024  - f/u in 12 weeks -- DFE/OCT/possible injection  5,6. Hypertensive retinopathy OU  - discussed importance of tight BP control  - had some Bps in hypertensive emergency range (200s / 110s)  - BP improved post kidney transplant  - discussed likely contribution to DME  - monitor   - BP management per nephrology, PCP and cardiology            7. 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  - discussed diagnosis and progression  - not yet visually significant  - monitor for now  Ophthalmic Meds Ordered this visit:  Meds ordered this encounter  Medications   aflibercept (EYLEA) SOLN 2 mg     Return in about 12 weeks (around 11/01/2022) for f/u Severe NPDR OU, DFE, OCT, Possible, IVE, OD.  There are no Patient Instructions on file for this visit.  Explained the diagnoses, plan, and follow up with the patient and they expressed understanding.  Patient expressed understanding  of the importance of proper follow up care.   This document serves as a record of services personally performed by Karie Chimera, MD, PhD. It was created on their behalf by Gerilyn Nestle, COT an ophthalmic technician. The creation of this record is the provider's dictation and/or activities during the visit.    Electronically signed by:  Gerilyn Nestle, COT 05.07.24  11:40 PM  Karie Chimera, M.D., Ph.D. Diseases & Surgery of the Retina and Vitreous [facility]  I have reviewed the above documentation for accuracy and completeness, and I agree with the above. Karie Chimera, M.D., Ph.D. 08/13/22 11:42 PM   Abbreviations: M myopia (nearsighted); A astigmatism; H hyperopia (farsighted); P presbyopia; Mrx spectacle prescription;  CTL contact lenses; OD right eye; OS left eye; OU both eyes  XT exotropia; ET esotropia; PEK punctate epithelial keratitis; PEE punctate epithelial erosions; DES dry eye syndrome; MGD meibomian gland dysfunction; ATs artificial tears; PFAT's preservative free artificial tears; NSC nuclear sclerotic cataract; PSC posterior subcapsular cataract; ERM epi-retinal membrane; PVD posterior vitreous detachment; RD retinal detachment; DM diabetes mellitus; DR diabetic retinopathy; NPDR non-proliferative diabetic retinopathy; PDR proliferative diabetic retinopathy; CSME clinically significant macular edema; DME diabetic macular edema; dbh dot blot hemorrhages; CWS cotton wool spot; POAG primary open angle glaucoma; C/D cup-to-disc ratio; HVF humphrey visual field; GVF goldmann visual field; OCT optical coherence tomography;  IOP intraocular pressure; BRVO Branch retinal vein occlusion; CRVO central retinal vein occlusion; CRAO central retinal artery occlusion; BRAO branch retinal artery occlusion; RT retinal tear; SB scleral buckle; PPV pars plana vitrectomy; VH Vitreous hemorrhage; PRP panretinal laser photocoagulation; IVK intravitreal kenalog; VMT  vitreomacular traction; MH Macular hole;  NVD neovascularization of the disc; NVE neovascularization elsewhere; AREDS age related eye disease study; ARMD age related macular degeneration; POAG primary open angle glaucoma; EBMD epithelial/anterior basement membrane dystrophy; ACIOL anterior chamber intraocular lens; IOL intraocular lens; PCIOL posterior chamber intraocular lens; Phaco/IOL phacoemulsification with intraocular lens placement; PRK photorefractive keratectomy; LASIK laser assisted in situ keratomileusis; HTN hypertension; DM diabetes mellitus; COPD chronic obstructive pulmonary disease

## 2022-08-02 ENCOUNTER — Encounter (INDEPENDENT_AMBULATORY_CARE_PROVIDER_SITE_OTHER): Payer: BC Managed Care – PPO | Admitting: Ophthalmology

## 2022-08-09 ENCOUNTER — Encounter (INDEPENDENT_AMBULATORY_CARE_PROVIDER_SITE_OTHER): Payer: Self-pay | Admitting: Ophthalmology

## 2022-08-09 ENCOUNTER — Ambulatory Visit (INDEPENDENT_AMBULATORY_CARE_PROVIDER_SITE_OTHER): Payer: BC Managed Care – PPO | Admitting: Ophthalmology

## 2022-08-09 DIAGNOSIS — H25813 Combined forms of age-related cataract, bilateral: Secondary | ICD-10-CM

## 2022-08-09 DIAGNOSIS — Z7985 Long-term (current) use of injectable non-insulin antidiabetic drugs: Secondary | ICD-10-CM

## 2022-08-09 DIAGNOSIS — I10 Essential (primary) hypertension: Secondary | ICD-10-CM

## 2022-08-09 DIAGNOSIS — Z7984 Long term (current) use of oral hypoglycemic drugs: Secondary | ICD-10-CM | POA: Diagnosis not present

## 2022-08-09 DIAGNOSIS — H35033 Hypertensive retinopathy, bilateral: Secondary | ICD-10-CM

## 2022-08-09 DIAGNOSIS — E113413 Type 2 diabetes mellitus with severe nonproliferative diabetic retinopathy with macular edema, bilateral: Secondary | ICD-10-CM | POA: Diagnosis not present

## 2022-08-09 DIAGNOSIS — Z794 Long term (current) use of insulin: Secondary | ICD-10-CM

## 2022-08-09 LAB — HM DIABETES EYE EXAM

## 2022-08-09 MED ORDER — AFLIBERCEPT 2MG/0.05ML IZ SOLN FOR KALEIDOSCOPE
2.0000 mg | INTRAVITREAL | Status: AC | PRN
  Administered 2022-08-09: 2 mg via INTRAVITREAL

## 2022-08-23 ENCOUNTER — Other Ambulatory Visit: Payer: Self-pay | Admitting: Family Medicine

## 2022-08-23 DIAGNOSIS — E1121 Type 2 diabetes mellitus with diabetic nephropathy: Secondary | ICD-10-CM

## 2022-08-30 ENCOUNTER — Other Ambulatory Visit: Payer: Self-pay | Admitting: Family Medicine

## 2022-08-30 DIAGNOSIS — E1121 Type 2 diabetes mellitus with diabetic nephropathy: Secondary | ICD-10-CM

## 2022-09-08 ENCOUNTER — Ambulatory Visit: Payer: BC Managed Care – PPO | Admitting: Family Medicine

## 2022-09-18 ENCOUNTER — Other Ambulatory Visit: Payer: Self-pay | Admitting: Family Medicine

## 2022-09-18 DIAGNOSIS — K2100 Gastro-esophageal reflux disease with esophagitis, without bleeding: Secondary | ICD-10-CM

## 2022-09-18 DIAGNOSIS — E088 Diabetes mellitus due to underlying condition with unspecified complications: Secondary | ICD-10-CM

## 2022-09-25 ENCOUNTER — Other Ambulatory Visit: Payer: Self-pay | Admitting: Family Medicine

## 2022-09-25 DIAGNOSIS — E1121 Type 2 diabetes mellitus with diabetic nephropathy: Secondary | ICD-10-CM

## 2022-09-29 NOTE — Assessment & Plan Note (Addendum)
Fairly well controlled.  No changes to medicines. Atorvastatin 40 mg daily, vascepa 2 g BID. Continue to work on eating a healthy diet and exercise.  Labs drawn today.

## 2022-09-29 NOTE — Assessment & Plan Note (Signed)
Well controlled.  No changes to medicines. amlodipine 5 mg daily, Losartan 25 mg daily, Labetalol 400 mg twice a day. Continue to work on eating a healthy diet and exercise.  Labs drawn today.

## 2022-09-29 NOTE — Progress Notes (Unsigned)
Subjective:  Patient ID: Philip Wilson, male    DOB: Jan 14, 1971  Age: 52 y.o. MRN: [id number]  Chief Complaint  Patient presents with   Medical Management of Chronic Issues    HPI Diabetes:  Complications:  HTN Glucose checking: before meals  Glucose logs: 120-170 Hypoglycemia: none Most recent A1C: 7.2% Current medications: Tresiba 48 units daily, Metformin XR 500 mg Take 2 tablets every day, Novolog 20 units three times a day based on sliding scale, ozempic 1 mg weekly Last Eye Exam: Due Foot checks: yes Has macular degeneration  Hyperlipidemia: Current medications: Atorvastatin 40 mg daily, vascepa 2 g BID.  Hypertension: Current medications: amlodipine 5 mg daily, Losartan 25 mg daily, Labetalol 400 mg twice a day, aspirin 81 mg daily.  GERD: Currently taking pantoprazole 40 mg daily.  Diet: Regular Exercise: None     05/26/2022    8:10 AM 03/23/2021   10:48 AM  Depression screen PHQ 2/9  Decreased Interest 0 0  Down, Depressed, Hopeless 0 0  PHQ - 2 Score 0 0  Altered sleeping 0   Tired, decreased energy 0   Change in appetite 0   Feeling bad or failure about yourself  0   Trouble concentrating 0   Moving slowly or fidgety/restless 0   Suicidal thoughts 0   PHQ-9 Score 0   Difficult doing work/chores Not difficult at all         05/26/2022    7:56 AM  Fall Risk   Falls in the past year? 0  Number falls in past yr: 0  Injury with Fall? 0  Risk for fall due to : No Fall Risks  Follow up Falls evaluation completed    Patient Care Team: Blane Ohara, MD as PCP - General (Family Medicine) Roger Kill, NP as Nurse Practitioner (Nephrology)   Review of Systems  Constitutional:  Negative for appetite change, fatigue and fever.  HENT:  Negative for congestion, ear pain, sinus pressure and sore throat.   Respiratory:  Negative for cough, chest tightness, shortness of breath and wheezing.   Cardiovascular:  Negative for chest pain and  palpitations.  Gastrointestinal:  Negative for abdominal pain, constipation, diarrhea, nausea and vomiting.  Genitourinary:  Negative for dysuria and hematuria.  Musculoskeletal:  Negative for arthralgias, back pain, joint swelling and myalgias.  Skin:  Negative for rash.  Neurological:  Negative for dizziness, weakness and headaches.  Psychiatric/Behavioral:  Negative for dysphoric mood. The patient is not nervous/anxious.     Current Outpatient Medications on File Prior to Visit  Medication Sig Dispense Refill   ACCU-CHEK GUIDE test strip USE THREE TIMES DAILY 100 strip 6   AgaMatrix Ultra-Thin Lancets MISC 200 each by Misc.(Non-Drug; Combo Route) route 4 times daily. One touch delica     amLODipine (NORVASC) 5 MG tablet Take 5 mg by mouth daily.     aspirin 81 MG EC tablet Take by mouth.     atorvastatin (LIPITOR) 40 MG tablet Take 1 tablet (40 mg total) by mouth daily. 90 tablet 0   BD PEN NEEDLE NANO 2ND GEN 32G X 4 MM MISC 4 (four) times daily. as directed     Blood Glucose Monitoring Suppl (FIFTY50 GLUCOSE METER 2.0) w/Device KIT 1 each by Other route 4 times daily. Use as instructed one touch ultra     insulin aspart (NOVOLOG FLEXPEN) 100 UNIT/ML FlexPen INJECT 20 UNITS UNDER THE SKIN THREE TIMES DAILY WITH MEALS. MODIFIED DOSE BEFORE MEALS BASED ON SLIDING SCALE.  MAX 30 UNITS 15 mL 3   labetalol (NORMODYNE) 200 MG tablet Take 400 mg by mouth 2 (two) times daily.     losartan (COZAAR) 25 MG tablet Take 25 mg by mouth daily.     metFORMIN (GLUCOPHAGE-XR) 500 MG 24 hr tablet TAKE 2 TABLETS BY MOUTH EVERY DAY 180 tablet 1   mycophenolate (MYFORTIC) 180 MG EC tablet Take 360 mg by mouth 2 (two) times daily.     pantoprazole (PROTONIX) 40 MG tablet TAKE 1 TABLET(40 MG) BY MOUTH DAILY 90 tablet 1   predniSONE (DELTASONE) 5 MG tablet Take 5 mg by mouth daily with breakfast.     tacrolimus (PROGRAF) 1 MG capsule Take by mouth.     TRESIBA FLEXTOUCH 200 UNIT/ML FlexTouch Pen ADMINISTER 48  UNITS UNDER THE SKIN DAILY 9 mL 1   Current Facility-Administered Medications on File Prior to Visit  Medication Dose Route Frequency Provider Last Rate Last Admin   aflibercept (EYLEA) SOLN 2 mg  2 mg Intravitreal  Rennis Chris, MD   2 mg at 05/13/18 2255   Bevacizumab (AVASTIN) SOLN 1.25 mg  1.25 mg Intravitreal  Rennis Chris, MD   1.25 mg at 09/06/17 1312   Past Medical History:  Diagnosis Date   Cataract    OU   Chronic kidney disease 05/04/2018   Chronic kidney disease, stage 3a (HCC) 05/29/2022   Diabetes 1.5, managed as type 2 (HCC)    Diabetic retinopathy (HCC)    NPDR OU   Hypertension    Hypertensive retinopathy    OU   Past Surgical History:  Procedure Laterality Date   AV FISTULA PLACEMENT Left 09/28/2018   Procedure: Creation of Left arm Radiocephalic ARTERIOVENOUS (AV) FISTULA;  Surgeon: Cephus Shelling, MD;  Location: [facility] OR;  Service: Vascular;  Laterality: Left;   KIDNEY TRANSPLANT  04/02/2019   MULTIPLE TOOTH EXTRACTIONS      Family History  Problem Relation Age of Onset   Heart failure Mother    Social History   Socioeconomic History   Marital status: Married    Spouse name: Not on file   Number of children: Not on file   Years of education: Not on file   Highest education level: Not on file  Occupational History   Occupation: sales  Tobacco Use   Smoking status: Former   Smokeless tobacco: Never   Tobacco comments:    quit 20 years ago  Vaping Use   Vaping Use: Never used  Substance and Sexual Activity   Alcohol use: Not Currently   Drug use: Never   Sexual activity: Yes    Partners: Female  Other Topics Concern   Not on file  Social History Narrative   Not on file   Social Determinants of Health   Financial Resource Strain: Low Risk  (05/26/2022)   Overall Financial Resource Strain (CARDIA)    Difficulty of Paying Living Expenses: Not hard at all  Food Insecurity: No Food Insecurity (05/26/2022)   Hunger Vital Sign    Worried  About Running Out of Food in the Last Year: Never true    Ran Out of Food in the Last Year: Never true  Transportation Needs: No Transportation Needs (05/26/2022)   PRAPARE - Transportation    Lack of Transportation (Medical): No    Lack of Transportation (Non-Medical): No  Physical Activity: Inactive (05/26/2022)   Exercise Vital Sign    Days of Exercise per Week: 0 days    Minutes of Exercise per  Session: 0 min  Stress: No Stress Concern Present (05/26/2022)   [employer] of Occupational Health - Occupational Stress Questionnaire    Feeling of Stress : Not at all  Social Connections: Moderately Integrated (05/26/2022)   Social Connection and Isolation Panel [NHANES]    Frequency of Communication with Friends and Family: Twice a week    Frequency of Social Gatherings with Friends and Family: Twice a week    Attends Religious Services: 1 to 4 times per year    Active Member of [employer] or Organizations: No    Attends Engineer, structural: Never    Marital Status: Married    Objective:  BP 98/62 (BP Location: Left Arm, Patient Position: Sitting)   Pulse 85   Temp (!) 97.5 F (36.4 C) (Temporal)   Ht 5\' 10"  (1.778 m)   Wt 203 lb (92.1 kg)   SpO2 98%   BMI 29.13 kg/m      09/30/2022   10:03 AM 05/26/2022    7:48 AM 02/08/2022    9:27 AM  BP/Weight  Systolic BP 98 128 130  Diastolic BP 62 84 86  Wt. (Lbs) 203 232 233  BMI 29.13 kg/m2 33.29 kg/m2 33.43 kg/m2    Physical Exam Vitals reviewed.  Constitutional:      Appearance: Normal appearance. He is normal weight.  Cardiovascular:     Rate and Rhythm: Normal rate and regular rhythm.     Pulses: Normal pulses.     Heart sounds: Normal heart sounds.  Pulmonary:     Effort: Pulmonary effort is normal. No respiratory distress.     Breath sounds: Normal breath sounds.  Abdominal:     General: Abdomen is flat. Bowel sounds are normal.     Palpations: Abdomen is soft.  Neurological:     Mental Status: He is alert  and oriented to person, place, and time.  Psychiatric:        Mood and Affect: Mood normal.        Behavior: Behavior normal.     Diabetic Foot Exam - Simple   Simple Foot Form Diabetic Foot exam was performed with the following findings: Yes 09/30/2022 10:14 AM  Visual Inspection No deformities, no ulcerations, no other skin breakdown bilaterally: Yes See comments: Yes Sensation Testing Intact to touch and monofilament testing bilaterally: Yes Pulse Check Posterior Tibialis and Dorsalis pulse intact bilaterally: Yes Comments Patient's Big Toe and 2nd toe of left foot and partially amputated due to lawn mower injury as a child.      Lab Results  Component Value Date   WBC 7.7 09/30/2022   HGB 14.3 09/30/2022   HCT 42.3 09/30/2022   PLT 248 09/30/2022   GLUCOSE 105 (H) 09/30/2022   CHOL 118 09/30/2022   TRIG 177 (H) 09/30/2022   HDL 41 09/30/2022   LDLCALC 48 09/30/2022   ALT 26 09/30/2022   AST 27 09/30/2022   NA 137 09/30/2022   K 4.4 09/30/2022   CL 100 09/30/2022   CREATININE 1.35 (H) 09/30/2022   BUN 21 09/30/2022   CO2 23 09/30/2022   INR 1.0 05/29/2018   HGBA1C 6.5 (H) 09/30/2022      Assessment & Plan:    Diabetic glomerulopathy (HCC) Assessment & Plan: Control: improved. Recommend check sugars fasting daily. Recommend check feet daily. Recommend annual eye exams. Medicines: Tresiba 48 units daily, Metformin XR 500 mg Take 2 tablets every day, Novolog 20 units three times a day based on sliding scale. Increase  ozempic to 2 mg weekly. Hope to decrease insulin requirements.  Increase Ozempic 1 mg to Ozempic 2 mg. Continue to work on eating a healthy diet and exercise.  Labs drawn today.     Orders: -     Hemoglobin A1c -     Ozempic (2 MG/DOSE); Inject 2 mg into the skin once a week.  Dispense: 9 mL; Refill: 0  Hypertensive kidney disease, benign, stage 1-4 or unspecified chronic kidney disease Assessment & Plan: Well controlled.  No changes to  medicines. amlodipine 5 mg daily, Losartan 25 mg daily, Labetalol 400 mg twice a day. Continue to work on eating a healthy diet and exercise.  Labs drawn today.    Orders: -     CBC with Differential/Platelet -     Comprehensive metabolic panel  Mixed hyperlipidemia Assessment & Plan: Fairly well controlled.  No changes to medicines. Atorvastatin 40 mg daily, vascepa 2 g BID. Continue to work on eating a healthy diet and exercise.  Labs drawn today.    Orders: -     Lipid panel  Need for vaccination -     Varicella-zoster vaccine IM -     Tdap vaccine greater than or equal to 7yo IM  Living-donor kidney transplant recipient Assessment & Plan: The current medical regimen is effective;  continue present plan and medications. Continue prograf and prednisone.  Management per specialist.     Chronic kidney disease, stage II (mild) Assessment & Plan: stable   Immunosuppression (HCC) Assessment & Plan: Secondary to meds for renal transplant.   Other orders -     [facility] CKD Program     Meds ordered this encounter  Medications   Semaglutide, 2 MG/DOSE, (OZEMPIC, 2 MG/DOSE,) 8 MG/3ML SOPN    Sig: Inject 2 mg into the skin once a week.    Dispense:  9 mL    Refill:  0    Orders Placed This Encounter  Procedures   Zoster Recombinant (Shingrix )   Tdap vaccine greater than or equal to 7yo IM   CBC with Differential/Platelet   Comprehensive metabolic panel   Hemoglobin A1c   Lipid panel   [facility] CKD [facility]     Follow-up: No follow-ups on file.   I,Marla I Leal-Borjas,acting as a scribe for Blane Ohara, MD.,have documented all relevant documentation on the behalf of Blane Ohara, MD,as directed by  Blane Ohara, MD while in the presence of Blane Ohara, MD.   An After Visit Summary was printed and given to the patient.  Blane Ohara, MD [facility] [phone]

## 2022-09-29 NOTE — Assessment & Plan Note (Addendum)
Control: improved. Recommend check sugars fasting daily. Recommend check feet daily. Recommend annual eye exams. Medicines: Tresiba 48 units daily, Metformin XR 500 mg Take 2 tablets every day, Novolog 20 units three times a day based on sliding scale. Increase ozempic to 2 mg weekly. Hope to decrease insulin requirements.  Increase Ozempic 1 mg to Ozempic 2 mg. Continue to work on eating a healthy diet and exercise.  Labs drawn today.

## 2022-09-30 ENCOUNTER — Encounter: Payer: Self-pay | Admitting: Family Medicine

## 2022-09-30 ENCOUNTER — Ambulatory Visit: Payer: BC Managed Care – PPO | Admitting: Family Medicine

## 2022-09-30 VITALS — BP 98/62 | HR 85 | Temp 97.5°F | Ht 70.0 in | Wt 203.0 lb

## 2022-09-30 DIAGNOSIS — Z23 Encounter for immunization: Secondary | ICD-10-CM | POA: Diagnosis not present

## 2022-09-30 DIAGNOSIS — I129 Hypertensive chronic kidney disease with stage 1 through stage 4 chronic kidney disease, or unspecified chronic kidney disease: Secondary | ICD-10-CM | POA: Diagnosis not present

## 2022-09-30 DIAGNOSIS — N182 Chronic kidney disease, stage 2 (mild): Secondary | ICD-10-CM

## 2022-09-30 DIAGNOSIS — E782 Mixed hyperlipidemia: Secondary | ICD-10-CM | POA: Diagnosis not present

## 2022-09-30 DIAGNOSIS — D849 Immunodeficiency, unspecified: Secondary | ICD-10-CM

## 2022-09-30 DIAGNOSIS — E1121 Type 2 diabetes mellitus with diabetic nephropathy: Secondary | ICD-10-CM

## 2022-09-30 DIAGNOSIS — Z940 Kidney transplant status: Secondary | ICD-10-CM

## 2022-09-30 MED ORDER — OZEMPIC (2 MG/DOSE) 8 MG/3ML ~~LOC~~ SOPN
2.0000 mg | PEN_INJECTOR | SUBCUTANEOUS | 0 refills | Status: DC
Start: 2022-09-30 — End: 2022-12-27

## 2022-10-01 ENCOUNTER — Other Ambulatory Visit: Payer: Self-pay | Admitting: Family Medicine

## 2022-10-01 LAB — CBC WITH DIFFERENTIAL/PLATELET
Basophils Absolute: 0 10*3/uL (ref 0.0–0.2)
Basos: 0 %
EOS (ABSOLUTE): 0.1 10*3/uL (ref 0.0–0.4)
Eos: 2 %
Hematocrit: 42.3 % (ref 37.5–51.0)
Hemoglobin: 14.3 g/dL (ref 13.0–17.7)
Immature Grans (Abs): 0 10*3/uL (ref 0.0–0.1)
Immature Granulocytes: 0 %
Lymphocytes Absolute: 3.3 10*3/uL — ABNORMAL HIGH (ref 0.7–3.1)
Lymphs: 43 %
MCH: 29.4 pg (ref 26.6–33.0)
MCHC: 33.8 g/dL (ref 31.5–35.7)
MCV: 87 fL (ref 79–97)
Monocytes Absolute: 0.8 10*3/uL (ref 0.1–0.9)
Monocytes: 10 %
Neutrophils Absolute: 3.4 10*3/uL (ref 1.4–7.0)
Neutrophils: 45 %
Platelets: 248 10*3/uL (ref 150–450)
RBC: 4.87 x10E6/uL (ref 4.14–5.80)
RDW: 12.2 % (ref 11.6–15.4)
WBC: 7.7 10*3/uL (ref 3.4–10.8)

## 2022-10-01 LAB — COMPREHENSIVE METABOLIC PANEL
ALT: 26 IU/L (ref 0–44)
AST: 27 IU/L (ref 0–40)
Albumin: 4.4 g/dL (ref 3.8–4.9)
Alkaline Phosphatase: 71 IU/L (ref 44–121)
BUN/Creatinine Ratio: 16 (ref 9–20)
BUN: 21 mg/dL (ref 6–24)
Bilirubin Total: 1.8 mg/dL — ABNORMAL HIGH (ref 0.0–1.2)
CO2: 23 mmol/L (ref 20–29)
Calcium: 10.1 mg/dL (ref 8.7–10.2)
Chloride: 100 mmol/L (ref 96–106)
Creatinine, Ser: 1.35 mg/dL — ABNORMAL HIGH (ref 0.76–1.27)
Globulin, Total: 3.3 g/dL (ref 1.5–4.5)
Glucose: 105 mg/dL — ABNORMAL HIGH (ref 70–99)
Potassium: 4.4 mmol/L (ref 3.5–5.2)
Sodium: 137 mmol/L (ref 134–144)
Total Protein: 7.7 g/dL (ref 6.0–8.5)
eGFR: 63 mL/min/{1.73_m2} (ref >59)

## 2022-10-01 LAB — LIPID PANEL
Chol/HDL Ratio: 2.9 ratio (ref 0.0–5.0)
Cholesterol, Total: 118 mg/dL (ref 100–199)
HDL: 41 mg/dL (ref >39)
LDL Chol Calc (NIH): 48 mg/dL (ref 0–99)
Triglycerides: 177 mg/dL — ABNORMAL HIGH (ref 0–149)
VLDL Cholesterol Cal: 29 mg/dL (ref 5–40)

## 2022-10-01 LAB — HEMOGLOBIN A1C
Est. average glucose Bld gHb Est-mCnc: 140 mg/dL
Hgb A1c MFr Bld: 6.5 % — ABNORMAL HIGH (ref 4.8–5.6)

## 2022-10-02 ENCOUNTER — Encounter: Payer: Self-pay | Admitting: Family Medicine

## 2022-10-02 DIAGNOSIS — Z23 Encounter for immunization: Secondary | ICD-10-CM | POA: Insufficient documentation

## 2022-10-02 DIAGNOSIS — N182 Chronic kidney disease, stage 2 (mild): Secondary | ICD-10-CM | POA: Insufficient documentation

## 2022-10-02 NOTE — Assessment & Plan Note (Signed)
Secondary to meds for renal transplant.

## 2022-10-02 NOTE — Assessment & Plan Note (Signed)
stable °

## 2022-10-02 NOTE — Assessment & Plan Note (Signed)
The current medical regimen is effective;  continue present plan and medications. Continue prograf and prednisone.  Management per specialist.

## 2022-10-17 ENCOUNTER — Encounter: Payer: Self-pay | Admitting: Family Medicine

## 2022-10-18 MED ORDER — SILDENAFIL CITRATE 20 MG PO TABS: ORAL_TABLET | ORAL | 0 refills | Status: AC

## 2022-10-20 ENCOUNTER — Other Ambulatory Visit: Payer: Self-pay

## 2022-10-20 DIAGNOSIS — E088 Diabetes mellitus due to underlying condition with unspecified complications: Secondary | ICD-10-CM

## 2022-10-20 LAB — LAB REPORT - SCANNED: Creatinine, Urine.: 145.3

## 2022-10-21 MED ORDER — NOVOLOG FLEXPEN 100 UNIT/ML ~~LOC~~ SOPN
20.0000 [IU] | PEN_INJECTOR | Freq: Three times a day (TID) | SUBCUTANEOUS | 3 refills | Status: AC
Start: 2022-10-21 — End: ?

## 2022-11-01 ENCOUNTER — Encounter (INDEPENDENT_AMBULATORY_CARE_PROVIDER_SITE_OTHER): Payer: BC Managed Care – PPO | Admitting: Ophthalmology

## 2022-11-01 DIAGNOSIS — Z794 Long term (current) use of insulin: Secondary | ICD-10-CM

## 2022-11-01 DIAGNOSIS — Z7985 Long-term (current) use of injectable non-insulin antidiabetic drugs: Secondary | ICD-10-CM

## 2022-11-01 DIAGNOSIS — I10 Essential (primary) hypertension: Secondary | ICD-10-CM

## 2022-11-01 DIAGNOSIS — H25813 Combined forms of age-related cataract, bilateral: Secondary | ICD-10-CM

## 2022-11-01 DIAGNOSIS — H35033 Hypertensive retinopathy, bilateral: Secondary | ICD-10-CM

## 2022-11-01 DIAGNOSIS — Z7984 Long term (current) use of oral hypoglycemic drugs: Secondary | ICD-10-CM

## 2022-11-01 DIAGNOSIS — E113413 Type 2 diabetes mellitus with severe nonproliferative diabetic retinopathy with macular edema, bilateral: Secondary | ICD-10-CM

## 2022-11-01 NOTE — Progress Notes (Signed)
[facility] - Clinic Note  11/08/2022     CHIEF COMPLAINT Patient presents for Retina Follow Up  HISTORY OF PRESENT ILLNESS: Philip Wilson is a 52 y.o. male who presents to the clinic today for:   HPI     Retina Follow Up   Patient presents with  Diabetic Retinopathy.  In both eyes.  Severity is moderate.  Duration of 12 weeks.  Since onset it is stable.  I, the attending physician,  performed the HPI with the patient and updated documentation appropriately.        Comments   Pt here for 12 wk ret f/u NPDR OU. Pt states VA is the same. No changes.       Last edited by Rennis Chris, MD on 11/08/2022  3:36 PM.    Patient feels the vision is the same since his last visit.   Referring physician: Blane Ohara, MD [address] [place],  [place] [address]  HISTORICAL INFORMATION:   Selected notes from the MEDICAL RECORD NUMBER Referred by Dr. Tomma Lightning for diabetic retinopathy OU   CURRENT MEDICATIONS: No current outpatient medications on file. (Ophthalmic Drugs)   Current Facility-Administered Medications (Ophthalmic Drugs)  Medication Route   aflibercept (EYLEA) SOLN 2 mg Intravitreal   Current Outpatient Medications (Other)  Medication Sig   ACCU-CHEK GUIDE test strip USE THREE TIMES DAILY   AgaMatrix Ultra-Thin Lancets MISC 200 each by Misc.(Non-Drug; Combo Route) route 4 times daily. One touch delica   amLODipine (NORVASC) 5 MG tablet Take 5 mg by mouth daily.   aspirin 81 MG EC tablet Take by mouth.   atorvastatin (LIPITOR) 40 MG tablet Take 1 tablet (40 mg total) by mouth daily.   BD PEN NEEDLE NANO 2ND GEN 32G X 4 MM MISC 4 (four) times daily. as directed   Blood Glucose Monitoring Suppl (FIFTY50 GLUCOSE METER 2.0) w/Device KIT 1 each by Other route 4 times daily. Use as instructed one touch ultra   insulin aspart (NOVOLOG FLEXPEN) 100 UNIT/ML FlexPen Inject 20 Units into the skin 3 (three) times daily with meals.   labetalol  (NORMODYNE) 200 MG tablet Take 400 mg by mouth 2 (two) times daily.   losartan (COZAAR) 25 MG tablet Take 25 mg by mouth daily.   mycophenolate (MYFORTIC) 180 MG EC tablet Take 360 mg by mouth 2 (two) times daily.   pantoprazole (PROTONIX) 40 MG tablet TAKE 1 TABLET(40 MG) BY MOUTH DAILY   predniSONE (DELTASONE) 5 MG tablet Take 5 mg by mouth daily with breakfast.   Semaglutide, 2 MG/DOSE, (OZEMPIC, 2 MG/DOSE,) 8 MG/3ML SOPN Inject 2 mg into the skin once a week.   sildenafil (REVATIO) 20 MG tablet Take 3 to 5 tablets 1 hour prior to intercourse.  No more than 24 hours.   tacrolimus (PROGRAF) 1 MG capsule Take by mouth.   TRESIBA FLEXTOUCH 200 UNIT/ML FlexTouch Pen ADMINISTER 48 UNITS UNDER THE SKIN DAILY   VASCEPA 1 g capsule TAKE 2 CAPSULES(2 GRAMS) BY MOUTH TWICE DAILY   metFORMIN (GLUCOPHAGE-XR) 500 MG 24 hr tablet TAKE 2 TABLETS BY MOUTH EVERY DAY   Current Facility-Administered Medications (Other)  Medication Route   Bevacizumab (AVASTIN) SOLN 1.25 mg Intravitreal   REVIEW OF SYSTEMS: ROS   Positive for: Neurological, Genitourinary, Endocrine, Eyes Negative for: Constitutional, Gastrointestinal, Skin, Musculoskeletal, HENT, Cardiovascular, Respiratory, Psychiatric, Allergic/Imm, Heme/Lymph Last edited by Thompson Grayer, COT on 11/08/2022  1:56 PM.     ALLERGIES No Known  Allergies  PAST MEDICAL HISTORY Past Medical History:  Diagnosis Date   Cataract    OU   Chronic kidney disease 05/04/2018   Chronic kidney disease, stage 3a (HCC) 05/29/2022   Diabetes 1.5, managed as type 2 (HCC)    Diabetic retinopathy (HCC)    NPDR OU   Hypertension    Hypertensive retinopathy    OU   Past Surgical History:  Procedure Laterality Date   AV FISTULA PLACEMENT Left 09/28/2018   Procedure: Creation of Left arm Radiocephalic ARTERIOVENOUS (AV) FISTULA;  Surgeon: Cephus Shelling, MD;  Location: [facility] OR;  Service: Vascular;  Laterality: Left;   KIDNEY TRANSPLANT  04/02/2019    MULTIPLE TOOTH EXTRACTIONS     FAMILY HISTORY Family History  Problem Relation Age of Onset   Heart failure Mother    SOCIAL HISTORY Social History   Tobacco Use   Smoking status: Former   Smokeless tobacco: Never   Tobacco comments:    quit 20 years ago  Vaping Use   Vaping status: Never Used  Substance Use Topics   Alcohol use: Not Currently   Drug use: Never       OPHTHALMIC EXAM:  Base Eye Exam     Visual Acuity (Snellen - Linear)       Right Left   Dist [place] 20/40 20/40 -2   Dist ph [place] 20/25 20/25 -3    Correction: Glasses         Tonometry (Tonopen, 2:04 PM)       Right Left   Pressure 11 11         Pupils       Pupils Dark Light Shape React APD   Right PERRL 3 2 Round Brisk None   Left PERRL 3 2 Round Brisk None         Visual Fields (Counting fingers)       Left Right    Full Full         Extraocular Movement       Right Left    Full, Ortho Full, Ortho         Neuro/Psych     Oriented x3: Yes   Mood/Affect: Normal         Dilation     Both eyes: 1.0% Mydriacyl, 2.5% Phenylephrine @ 2:04 PM           Slit Lamp and Fundus Exam     Slit Lamp Exam       Right Left   Lids/Lashes Dermatochalasis - upper lid, mild Meibomian gland dysfunction Dermatochalasis - upper lid, mild Meibomian gland dysfunction   Conjunctiva/Sclera White and quiet White and quiet   Cornea trace PEE trace PEE   Anterior Chamber Deep and quiet, no cell or flare Deep and quiet, no cell or flare   Iris Round and dilated to 7mm Round and dilated to 7mm   Lens 1+ Nuclear sclerosis, 2+ Cortical cataract 1+ Nuclear sclerosis, 2+ Cortical cataract   Anterior Vitreous Mild Vitreous syneresis, Posterior vitreous detachment, Weiss ring Mild Vitreous syneresis, old white VH inferiorly         Fundus Exam       Right Left   Disc Pink and Sharp Pink and Sharp, Compact   C/D Ratio 0.2 0.3   Macula good foveal reflex, mild ERM greatest superior to  fovea, focal cystic changes ST macula-- improved, no heme Flat, good foveal reflex, mild ERM, No heme or edema   Vessels attenuated, Tortuous  attenuated, Tortuous   Periphery Attached, rare MAs, No RT/RD Attached, rare MA           IMAGING AND PROCEDURES  Imaging and Procedures for @TODAY @  OCT, Retina - OU - Both Eyes       Right Eye Quality was good. Central Foveal Thickness: 275. Progression has improved. Findings include no SRF, abnormal foveal contour, retinal drusen , intraretinal hyper-reflective material, epiretinal membrane, intraretinal fluid, macular pucker (Mild Interval improvement in cystic changes and edema ST macula; +ERM and macular pucker).   Left Eye Quality was good. Central Foveal Thickness: 244. Progression has been stable. Findings include normal foveal contour, no IRF, no SRF, intraretinal hyper-reflective material, epiretinal membrane (Trace cystic changes stably improved).   Notes *Images captured and stored on drive  Diagnosis / Impression:  OD: Mild Interval improvement in cystic changes and edema ST macula; +ERM and macular pucker OS: NFP, no IRF/SRF -- no DME  Clinical management:  See below  Abbreviations: NFP - Normal foveal profile. CME - cystoid macular edema. PED - pigment epithelial detachment. IRF - intraretinal fluid. SRF - subretinal fluid. EZ - ellipsoid zone. ERM - epiretinal membrane. ORA - outer retinal atrophy. ORT - outer retinal tubulation. SRHM - subretinal hyper-reflective material      Intravitreal Injection, Pharmacologic Agent - OD - Right Eye       Time Out 11/08/2022. 2:35 PM. Confirmed correct patient, procedure, site, and patient consented.   Anesthesia Topical anesthesia was used. Anesthetic medications included Lidocaine 2%, Proparacaine 0.5%.   Procedure Preparation included 5% betadine to ocular surface, eyelid speculum. A (32g) needle was used.   Injection: 2 mg aflibercept 2 MG/0.05ML   Route: Intravitreal,  Site: Right Eye   NDC: [id number], Lot: [id number], Expiration date: 07/03/2023, Waste: 0 mL   Post-op Post injection exam found visual acuity of at least counting fingers. The patient tolerated the procedure well. There were no complications. The patient received written and verbal post procedure care education. Post injection medications were not given.            ASSESSMENT/PLAN:    ICD-10-CM   1. 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 Current use of insulin (HCC)  Z79.4     3. Long-term (current) use of injectable non-insulin antidiabetic drugs  Z79.85     4. Long term (current) use of oral hypoglycemic drugs  Z79.84     5. Essential hypertension  I10     6. Hypertensive retinopathy of both eyes  H35.033     7. Combined forms of age-related cataract of both eyes  H25.813      1-4. Severe Non-proliferative diabetic retinopathy, both eyes  - delayed f/u -- 13 wks instead of 12  - delayed to f/u from 10-11 weeks to 18 weeks (10.03.23 - 02.06.24)  - last A1c was 6.5 on 09/30/22, 7.2 on 02.22.24; 7.0 on 11.07.23; 7.4 on 06.13.23  - initial exam showed massive central DME OU and scattered IRH and IRMA - FA 6.5.19 with patches of capillary nonperfusion; extensive Mas with late leakage OU; no frank NV  - FA 01.02.20, shows improvement in late leaking microaneurysms OU - S/P IVA OD #1 (06.07.19), #2 (07.08.19), #3 (07.08.19) - S/P IVE OD #1 (09.03.19), #  2 (10.03.19), #3 (11.04.19), #4 (12.02.19), #5 (01.02.20), #6 (02.07.20), #7 (03.16.20), #8 (05.20.20), #9 (06.22.20), #10 (07.27.20), #11(09.09.10), #12 (10.28.20), #13 (11.25.20), #14 (04.06.21) - sample, #15 (05.04.21) - sample, #16 (06.18.21), #17 (07.30.21), #18 (09.10.21), #19 (11.16.21), #20 (1.7.22),  #21 (4.12.22), #22 (06.07.22), #23 (8.1.22), #24 (10.11.22), #25 (12.6.22), #26 (1.31.23), # 27 (04.25.23), #28 (07.07.23), #29 (10.03.23), #30 (02.06.24), #31 (05.07.24) ==========================================================  - S/P IVA OS #1 (06.05.19), #2 (07.08.19), #3 (07.08.19), #4 (09.03.19) - S/P IVE OS #1 (10.03.19), #2 (11.04.19), #3 (12.02.19), #4 (01.02.20), #5 (02.07.20), #6 (03.16.20), #7 (05.20.20), #8 (06.22.20),#9 (07.27.20), #10 (09.09.20), #11 (10.28.20), #12 (11.25.20) =========================================================== - OCT shows [id number] Interval improvement in cystic changes and edema ST macula; +ERM and macular pucker at 13 weeks; OS: NFP, no IRF/SRF -- no DME  - BCVA: OD 20/25 - stable, OS 20/25 from 20/20  - recommend IVE OD #32 today, 08.06.24 w/ f/u in 12-13 wks  - will hold off on injection OS again today  - pt in agreement  - RBA of procedure discussed, questions answered  - informed consent obtained and signed  - see procedure note -- tolerated well - Eylea paperwork and benefits investigation started on 08.05.19 -- approved for 2024  - f/u in 12-13 weeks -- DFE/OCT/possible injection  5,6. Hypertensive retinopathy OU  - discussed importance of tight BP control  - had some Bps in hypertensive emergency range (200s / 110s)  - BP improved post kidney transplant  - discussed likely contribution to DME  - monitor   - BP management per nephrology, PCP and cardiology            7. 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  - discussed diagnosis and progression  - not yet visually significant  - monitor for now  Ophthalmic Meds Ordered this visit:  Meds ordered this encounter  Medications   aflibercept (EYLEA) SOLN 2 mg     Return in about 13 weeks (around 02/07/2023) for f/u NPDR OU, DFE, OCT, Possible, IVE, OU.  There are no Patient Instructions  on file for this visit.  Explained the diagnoses, plan, and follow up with the patient and they expressed understanding.  Patient expressed understanding of the importance of proper follow up care.   This document serves as a record of services personally performed by Karie Chimera, MD, PhD. It was created on their behalf by Gerilyn Nestle, COT an ophthalmic technician. The creation of this record is the provider's dictation and/or activities during the visit.    Electronically signed by:  Charlette Caffey, COT  11/08/22 3:39 PM   Karie Chimera, M.D., Ph.D. Diseases & Surgery of the Retina and Vitreous [facility]  I have reviewed the above documentation for accuracy and completeness, and I agree with the above. Karie Chimera, M.D., Ph.D. 11/08/22 3:41 PM   Abbreviations: M myopia (nearsighted); A astigmatism; H hyperopia (farsighted); P presbyopia; Mrx spectacle prescription;  CTL contact lenses; OD right eye; OS left eye; OU both eyes  XT exotropia; ET esotropia; PEK punctate epithelial keratitis; PEE punctate epithelial erosions; DES dry eye syndrome; MGD meibomian gland dysfunction; ATs artificial tears; PFAT's preservative free artificial tears; NSC nuclear sclerotic cataract; PSC posterior subcapsular cataract; ERM epi-retinal membrane; PVD posterior vitreous detachment; RD retinal detachment; DM diabetes mellitus; DR diabetic retinopathy; NPDR non-proliferative diabetic retinopathy; PDR proliferative diabetic retinopathy; CSME clinically significant macular edema;  DME diabetic macular edema; dbh dot blot hemorrhages; CWS cotton wool spot; POAG primary open angle glaucoma; C/D cup-to-disc ratio; HVF humphrey visual field; GVF goldmann visual field; OCT optical coherence tomography; IOP intraocular pressure; BRVO Branch retinal vein occlusion; CRVO central retinal vein occlusion; CRAO central retinal artery occlusion; BRAO branch retinal artery occlusion; RT  retinal tear; SB scleral buckle; PPV pars plana vitrectomy; VH Vitreous hemorrhage; PRP panretinal laser photocoagulation; IVK intravitreal kenalog; VMT vitreomacular traction; MH Macular hole;  NVD neovascularization of the disc; NVE neovascularization elsewhere; AREDS age related eye disease study; ARMD age related macular degeneration; POAG primary open angle glaucoma; EBMD epithelial/anterior basement membrane dystrophy; ACIOL anterior chamber intraocular lens; IOL intraocular lens; PCIOL posterior chamber intraocular lens; Phaco/IOL phacoemulsification with intraocular lens placement; PRK photorefractive keratectomy; LASIK laser assisted in situ keratomileusis; HTN hypertension; DM diabetes mellitus; COPD chronic obstructive pulmonary disease

## 2022-11-08 ENCOUNTER — Encounter (INDEPENDENT_AMBULATORY_CARE_PROVIDER_SITE_OTHER): Payer: Self-pay | Admitting: Ophthalmology

## 2022-11-08 ENCOUNTER — Ambulatory Visit (INDEPENDENT_AMBULATORY_CARE_PROVIDER_SITE_OTHER): Payer: BC Managed Care – PPO | Admitting: Ophthalmology

## 2022-11-08 DIAGNOSIS — Z7985 Long-term (current) use of injectable non-insulin antidiabetic drugs: Secondary | ICD-10-CM

## 2022-11-08 DIAGNOSIS — Z7984 Long term (current) use of oral hypoglycemic drugs: Secondary | ICD-10-CM | POA: Diagnosis not present

## 2022-11-08 DIAGNOSIS — E113413 Type 2 diabetes mellitus with severe nonproliferative diabetic retinopathy with macular edema, bilateral: Secondary | ICD-10-CM

## 2022-11-08 DIAGNOSIS — H35033 Hypertensive retinopathy, bilateral: Secondary | ICD-10-CM

## 2022-11-08 DIAGNOSIS — Z794 Long term (current) use of insulin: Secondary | ICD-10-CM | POA: Diagnosis not present

## 2022-11-08 DIAGNOSIS — H25813 Combined forms of age-related cataract, bilateral: Secondary | ICD-10-CM

## 2022-11-08 DIAGNOSIS — I10 Essential (primary) hypertension: Secondary | ICD-10-CM

## 2022-11-08 MED ORDER — AFLIBERCEPT 2MG/0.05ML IZ SOLN FOR KALEIDOSCOPE
2.0000 mg | INTRAVITREAL | Status: AC | PRN
Start: 2022-11-08 — End: 2022-11-08
  Administered 2022-11-08: 2 mg via INTRAVITREAL

## 2022-12-10 ENCOUNTER — Other Ambulatory Visit: Payer: Self-pay | Admitting: Family Medicine

## 2022-12-27 ENCOUNTER — Other Ambulatory Visit: Payer: Self-pay | Admitting: Family Medicine

## 2022-12-27 DIAGNOSIS — E1121 Type 2 diabetes mellitus with diabetic nephropathy: Secondary | ICD-10-CM

## 2022-12-28 ENCOUNTER — Other Ambulatory Visit: Payer: Self-pay

## 2022-12-28 ENCOUNTER — Other Ambulatory Visit: Payer: Self-pay | Admitting: Family Medicine

## 2022-12-28 ENCOUNTER — Encounter: Payer: Self-pay | Admitting: Family Medicine

## 2022-12-28 DIAGNOSIS — E1121 Type 2 diabetes mellitus with diabetic nephropathy: Secondary | ICD-10-CM

## 2022-12-28 DIAGNOSIS — E119 Type 2 diabetes mellitus without complications: Secondary | ICD-10-CM

## 2022-12-28 MED ORDER — ACCU-CHEK GUIDE VI STRP: ORAL_STRIP | 6 refills | Status: DC

## 2023-01-17 NOTE — Progress Notes (Unsigned)
Subjective:  Patient ID: Philip Wilson, male    DOB: 1970/12/24  Age: 52 y.o. MRN: [id number]  Chief Complaint  Patient presents with   Medical Management of Chronic Issues    HPI  Diabetes:  Complications:  HTN Glucose checking: before meals  Glucose logs: 120-170 Hypoglycemia: none Most recent A1C: 6.5% Current medications: Tresiba 48 units daily,, Novolog 20 units three times a day based on sliding scale, ozempic 2 mg weekly. Patient has lost 55 lbs. Doing great.  Last Eye Exam: Due Foot checks: yes Has macular degeneration   Hyperlipidemia: Current medications: Atorvastatin 40 mg daily, vascepa 2 g BID.   Hypertension with ckd stage 3a: s/p renal transplant. On prednisone and prograf. Current medications: amlodipine 5 mg daily, Losartan 25 mg daily, Labetalol 400 mg twice a day, aspirin 81 mg daily.   GERD: Currently taking pantoprazole 40 mg daily.   Diet: Avoid sugar.  Exercise: None      01/19/2023    9:11 AM 05/26/2022    8:10 AM 03/23/2021   10:48 AM  Depression screen PHQ 2/9  Decreased Interest 0 0 0  Down, Depressed, Hopeless 0 0 0  PHQ - 2 Score 0 0 0  Altered sleeping 0 0   Tired, decreased energy 0 0   Change in appetite 0 0   Feeling bad or failure about yourself  0 0   Trouble concentrating 0 0   Moving slowly or fidgety/restless 0 0   Suicidal thoughts 0 0   PHQ-9 Score 0 0   Difficult doing work/chores Not difficult at all Not difficult at all         01/19/2023    9:11 AM  Fall Risk   Falls in the past year? 0  Number falls in past yr: 0  Injury with Fall? 0  Risk for fall due to : No Fall Risks  Follow up Falls evaluation completed    Patient Care Team: Blane Ohara, MD as PCP - General (Family Medicine) Roger Kill, NP as Nurse Practitioner (Nephrology)   Review of Systems  Constitutional:  Negative for chills, diaphoresis, fatigue and fever.  HENT:  Negative for congestion, ear pain and sore throat.   Respiratory:   Negative for cough and shortness of breath.   Cardiovascular:  Negative for chest pain and leg swelling.  Gastrointestinal:  Negative for abdominal pain, constipation, diarrhea, nausea and vomiting.  Genitourinary:  Negative for dysuria and urgency.  Musculoskeletal:  Negative for arthralgias and myalgias.  Neurological:  Negative for dizziness and headaches.  Psychiatric/Behavioral:  Negative for dysphoric mood.     Current Outpatient Medications on File Prior to Visit  Medication Sig Dispense Refill   AgaMatrix Ultra-Thin Lancets MISC 200 each by Misc.(Non-Drug; Combo Route) route 4 times daily. One touch delica     amLODipine (NORVASC) 5 MG tablet Take 5 mg by mouth daily.     aspirin 81 MG EC tablet Take by mouth.     atorvastatin (LIPITOR) 40 MG tablet TAKE 1 TABLET(40 MG) BY MOUTH DAILY 90 tablet 0   BD PEN NEEDLE NANO 2ND GEN 32G X 4 MM MISC 4 (four) times daily. as directed     Blood Glucose Monitoring Suppl (FIFTY50 GLUCOSE METER 2.0) w/Device KIT 1 each by Other route 4 times daily. Use as instructed one touch ultra     glucose blood (ACCU-CHEK GUIDE) test strip Check blood sugar 3 times daily 100 strip 6   insulin aspart (NOVOLOG FLEXPEN) 100  UNIT/ML FlexPen Inject 20 Units into the skin 3 (three) times daily with meals. 54 mL 3   Insulin Glargine (BASAGLAR KWIKPEN) 100 UNIT/ML Inject into the skin.     labetalol (NORMODYNE) 200 MG tablet Take 400 mg by mouth 2 (two) times daily.     losartan (COZAAR) 25 MG tablet Take 25 mg by mouth daily.     mycophenolate (MYFORTIC) 180 MG EC tablet Take 360 mg by mouth 2 (two) times daily.     OZEMPIC, 2 MG/DOSE, 8 MG/3ML SOPN INJECT 2 MGS UNDER THE SKIN ONCE WEEKLY 9 mL 0   pantoprazole (PROTONIX) 40 MG tablet TAKE 1 TABLET(40 MG) BY MOUTH DAILY 90 tablet 1   sildenafil (REVATIO) 20 MG tablet Take 3 to 5 tablets 1 hour prior to intercourse.  No more than 24 hours. 50 tablet 0   tacrolimus (PROGRAF) 1 MG capsule Take 1 mg by mouth 2 (two)  times daily.     TRESIBA FLEXTOUCH 200 UNIT/ML FlexTouch Pen ADMINISTER 48 UNITS UNDER THE SKIN DAILY 9 mL 1   valGANciclovir (VALCYTE) 450 MG tablet Indications: treatment to prevent cytomegalovirus disease     VASCEPA 1 g capsule TAKE 2 CAPSULES(2 GRAMS) BY MOUTH TWICE DAILY 360 capsule 0   Current Facility-Administered Medications on File Prior to Visit  Medication Dose Route Frequency Provider Last Rate Last Admin   aflibercept (EYLEA) SOLN 2 mg  2 mg Intravitreal  Rennis Chris, MD   2 mg at 05/13/18 2255   Bevacizumab (AVASTIN) SOLN 1.25 mg  1.25 mg Intravitreal  Rennis Chris, MD   1.25 mg at 09/06/17 1312   Past Medical History:  Diagnosis Date   Cataract    OU   Chronic kidney disease 05/04/2018   Chronic kidney disease, stage 3a (HCC) 05/29/2022   Diabetes 1.5, managed as type 2 (HCC)    Diabetic retinopathy (HCC)    NPDR OU   Hypertension    Hypertensive retinopathy    OU   Past Surgical History:  Procedure Laterality Date   AV FISTULA PLACEMENT Left 09/28/2018   Procedure: Creation of Left arm Radiocephalic ARTERIOVENOUS (AV) FISTULA;  Surgeon: Cephus Shelling, MD;  Location: [facility] OR;  Service: Vascular;  Laterality: Left;   KIDNEY TRANSPLANT  04/02/2019   MULTIPLE TOOTH EXTRACTIONS      Family History  Problem Relation Age of Onset   Heart failure Mother    Social History   Socioeconomic History   Marital status: Married    Spouse name: Not on file   Number of children: Not on file   Years of education: Not on file   Highest education level: Not on file  Occupational History   Occupation: sales  Tobacco Use   Smoking status: Former   Smokeless tobacco: Never   Tobacco comments:    quit 20 years ago  Vaping Use   Vaping status: Never Used  Substance and Sexual Activity   Alcohol use: Not Currently   Drug use: Never   Sexual activity: Yes    Partners: Female  Other Topics Concern   Not on file  Social History Narrative   Not on file   Social  Determinants of Health   Financial Resource Strain: Low Risk  (05/26/2022)   Overall Financial Resource Strain (CARDIA)    Difficulty of Paying Living Expenses: Not hard at all  Food Insecurity: No Food Insecurity (05/26/2022)   Hunger Vital Sign    Worried About Running Out of Food in the  Last Year: Never true    Ran Out of Food in the Last Year: Never true  Transportation Needs: No Transportation Needs (05/26/2022)   PRAPARE - Administrator, Civil Service (Medical): No    Lack of Transportation (Non-Medical): No  Physical Activity: Inactive (05/26/2022)   Exercise Vital Sign    Days of Exercise per Week: 0 days    Minutes of Exercise per Session: 0 min  Stress: No Stress Concern Present (05/26/2022)   [employer] of Occupational Health - Occupational Stress Questionnaire    Feeling of Stress : Not at all  Social Connections: Moderately Integrated (05/26/2022)   Social Connection and Isolation Panel [NHANES]    Frequency of Communication with Friends and Family: Twice a week    Frequency of Social Gatherings with Friends and Family: Twice a week    Attends Religious Services: 1 to 4 times per year    Active Member of [employer] or Organizations: No    Attends Engineer, structural: Never    Marital Status: Married    Objective:  BP 132/70 (BP Location: Left Arm, Patient Position: Sitting, Cuff Size: Large)   Pulse 91   Temp 98.6 F (37 C) (Temporal)   Ht 5\' 10"  (1.778 m)   Wt 177 lb (80.3 kg)   SpO2 98%   BMI 25.40 kg/m      01/19/2023    9:08 AM 09/30/2022   10:03 AM 05/26/2022    7:48 AM  BP/Weight  Systolic BP 132 98 128  Diastolic BP 70 62 84  Wt. (Lbs) 177 203 232  BMI 25.4 kg/m2 29.13 kg/m2 33.29 kg/m2    Physical Exam Vitals reviewed.  Constitutional:      Appearance: Normal appearance. He is normal weight.  Cardiovascular:     Rate and Rhythm: Normal rate and regular rhythm.     Heart sounds: No murmur heard. Pulmonary:     Effort:  Pulmonary effort is normal.     Breath sounds: Normal breath sounds.  Abdominal:     General: Abdomen is flat. Bowel sounds are normal.     Palpations: Abdomen is soft.     Tenderness: There is no abdominal tenderness.  Neurological:     Mental Status: He is alert and oriented to person, place, and time.  Psychiatric:        Mood and Affect: Mood normal.        Behavior: Behavior normal.     Diabetic Foot Exam - Simple   Simple Foot Form  01/17/2023  8:52 PM  Visual Inspection No deformities, no ulcerations, no other skin breakdown bilaterally: Yes Sensation Testing Intact to touch and monofilament testing bilaterally: Yes Pulse Check Posterior Tibialis and Dorsalis pulse intact bilaterally: Yes Comments      Lab Results  Component Value Date   WBC 7.7 09/30/2022   HGB 14.3 09/30/2022   HCT 42.3 09/30/2022   PLT 248 09/30/2022   GLUCOSE 135 (H) 01/19/2023   CHOL 113 01/19/2023   TRIG 116 01/19/2023   HDL 47 01/19/2023   LDLCALC 45 01/19/2023   ALT 40 01/19/2023   AST 30 01/19/2023   NA 142 01/19/2023   K 4.5 01/19/2023   CL 103 01/19/2023   CREATININE 1.34 (H) 01/19/2023   BUN 16 01/19/2023   CO2 24 01/19/2023   INR 1.0 05/29/2018   HGBA1C 6.1 (H) 01/19/2023      Assessment & Plan:    Diabetic glomerulopathy (HCC) Assessment & Plan:  Control: improved. Recommend check sugars fasting daily. Recommend check feet daily. Recommend annual eye exams. Medicines: Tresiba 48 units daily, Metformin XR 500 mg Take 2 tablets every day, Novolog 20 units three times a day based on sliding scale. weekly. Hope to decrease insulin requirements, Ozempic 2 mg. Continue to work on eating a healthy diet and exercise.  Labs drawn today.     Orders: -     Hemoglobin A1c  Hypertensive kidney disease, benign, stage 1-4 or unspecified chronic kidney disease Assessment & Plan: Well controlled.  No changes to medicines. Stop amlodipine. Labetalol 400 mg twice a  day. Continue to work on eating a healthy diet and exercise.  Labs drawn today.      Orders: -     Comprehensive metabolic panel  Mixed hyperlipidemia Assessment & Plan: Fairly well controlled.  No changes to medicines. Atorvastatin 40 mg daily, vascepa 2 g BID. Continue to work on eating a healthy diet and exercise.  Labs drawn today.    Orders: -     Lipid panel  Stage 3a chronic kidney disease (HCC) Assessment & Plan: Improved.    Screening for colon cancer -     Cologuard  Living-donor kidney transplant recipient Assessment & Plan: Immunosuppressed.  Management per specialist.     Other orders -     Varicella-zoster vaccine IM -     predniSONE; Take 1 tablet (5 mg total) by mouth daily with breakfast.  Dispense: 1 tablet; Refill: 0     Meds ordered this encounter  Medications   predniSONE (DELTASONE) 5 MG tablet    Sig: Take 1 tablet (5 mg total) by mouth daily with breakfast.    Dispense:  1 tablet    Refill:  0    Orders Placed This Encounter  Procedures   Zoster Recombinant (Shingrix )   Comprehensive metabolic panel   Lipid panel   Hemoglobin A1c   Cologuard     Follow-up: Return in about 3 months (around 04/21/2023) for chronic fasting.   I,Katherina A Bramblett,acting as a scribe for Blane Ohara, MD.,have documented all relevant documentation on the behalf of Blane Ohara, MD,as directed by  Blane Ohara, MD while in the presence of Blane Ohara, MD.   Clayborn Bigness I Leal-Borjas,acting as a scribe for Blane Ohara, MD.,have documented all relevant documentation on the behalf of Blane Ohara, MD,as directed by  Blane Ohara, MD while in the presence of Blane Ohara, MD.    An After Visit Summary was printed and given to the patient.  Blane Ohara, MD [facility] [phone]

## 2023-01-19 ENCOUNTER — Encounter: Payer: Self-pay | Admitting: Family Medicine

## 2023-01-19 ENCOUNTER — Ambulatory Visit: Payer: BC Managed Care – PPO | Admitting: Family Medicine

## 2023-01-19 VITALS — BP 132/70 | HR 91 | Temp 98.6°F | Ht 70.0 in | Wt 177.0 lb

## 2023-01-19 DIAGNOSIS — I129 Hypertensive chronic kidney disease with stage 1 through stage 4 chronic kidney disease, or unspecified chronic kidney disease: Secondary | ICD-10-CM

## 2023-01-19 DIAGNOSIS — Z1211 Encounter for screening for malignant neoplasm of colon: Secondary | ICD-10-CM

## 2023-01-19 DIAGNOSIS — Z23 Encounter for immunization: Secondary | ICD-10-CM

## 2023-01-19 DIAGNOSIS — E782 Mixed hyperlipidemia: Secondary | ICD-10-CM | POA: Diagnosis not present

## 2023-01-19 DIAGNOSIS — N1831 Chronic kidney disease, stage 3a: Secondary | ICD-10-CM | POA: Diagnosis not present

## 2023-01-19 DIAGNOSIS — E1121 Type 2 diabetes mellitus with diabetic nephropathy: Secondary | ICD-10-CM | POA: Diagnosis not present

## 2023-01-19 DIAGNOSIS — Z940 Kidney transplant status: Secondary | ICD-10-CM

## 2023-01-19 DIAGNOSIS — I10 Essential (primary) hypertension: Secondary | ICD-10-CM

## 2023-01-19 LAB — LIPID PANEL
Chol/HDL Ratio: 2.4 {ratio} (ref 0.0–5.0)
Cholesterol, Total: 113 mg/dL (ref 100–199)
HDL: 47 mg/dL (ref >39)
LDL Chol Calc (NIH): 45 mg/dL (ref 0–99)
Triglycerides: 116 mg/dL (ref 0–149)
VLDL Cholesterol Cal: 21 mg/dL (ref 5–40)

## 2023-01-19 LAB — HEMOGLOBIN A1C
Est. average glucose Bld gHb Est-mCnc: 128 mg/dL
Hgb A1c MFr Bld: 6.1 % — ABNORMAL HIGH (ref 4.8–5.6)

## 2023-01-19 LAB — COMPREHENSIVE METABOLIC PANEL
ALT: 40 [IU]/L (ref 0–44)
AST: 30 [IU]/L (ref 0–40)
Albumin: 4.3 g/dL (ref 3.8–4.9)
Alkaline Phosphatase: 74 [IU]/L (ref 44–121)
BUN/Creatinine Ratio: 12 (ref 9–20)
BUN: 16 mg/dL (ref 6–24)
Bilirubin Total: 1.4 mg/dL — ABNORMAL HIGH (ref 0.0–1.2)
CO2: 24 mmol/L (ref 20–29)
Calcium: 9.9 mg/dL (ref 8.7–10.2)
Chloride: 103 mmol/L (ref 96–106)
Creatinine, Ser: 1.34 mg/dL — ABNORMAL HIGH (ref 0.76–1.27)
Globulin, Total: 3.1 g/dL (ref 1.5–4.5)
Glucose: 135 mg/dL — ABNORMAL HIGH (ref 70–99)
Potassium: 4.5 mmol/L (ref 3.5–5.2)
Sodium: 142 mmol/L (ref 134–144)
Total Protein: 7.4 g/dL (ref 6.0–8.5)
eGFR: 63 mL/min/{1.73_m2} (ref >59)

## 2023-01-19 NOTE — Patient Instructions (Addendum)
Stop amlodipine.  Continue to use limited novolog per the SSI.

## 2023-01-19 NOTE — Assessment & Plan Note (Addendum)
Well controlled.  No changes to medicines. Stop amlodipine. Labetalol 400 mg twice a day. Continue to work on eating a healthy diet and exercise.  Labs drawn today.

## 2023-01-19 NOTE — Assessment & Plan Note (Signed)
Fairly well controlled.  No changes to medicines. Atorvastatin 40 mg daily, vascepa 2 g BID. Continue to work on eating a healthy diet and exercise.  Labs drawn today.

## 2023-01-19 NOTE — Assessment & Plan Note (Addendum)
Control: improved. Recommend check sugars fasting daily. Recommend check feet daily. Recommend annual eye exams. Medicines: Tresiba 48 units daily, Metformin XR 500 mg Take 2 tablets every day, Novolog 20 units three times a day based on sliding scale. weekly. Hope to decrease insulin requirements, Ozempic 2 mg. Continue to work on eating a healthy diet and exercise.  Labs drawn today.

## 2023-01-22 ENCOUNTER — Encounter: Payer: Self-pay | Admitting: Family Medicine

## 2023-01-22 DIAGNOSIS — N1831 Chronic kidney disease, stage 3a: Secondary | ICD-10-CM | POA: Insufficient documentation

## 2023-01-22 MED ORDER — PREDNISONE 5 MG PO TABS: 5.0000 mg | ORAL_TABLET | Freq: Every day | ORAL | 0 refills | Status: DC

## 2023-01-22 NOTE — Assessment & Plan Note (Signed)
Improved

## 2023-01-22 NOTE — Assessment & Plan Note (Signed)
Immunosuppressed.  Management per specialist.

## 2023-02-04 ENCOUNTER — Other Ambulatory Visit: Payer: Self-pay | Admitting: Family Medicine

## 2023-02-07 ENCOUNTER — Encounter (INDEPENDENT_AMBULATORY_CARE_PROVIDER_SITE_OTHER): Payer: BC Managed Care – PPO | Admitting: Ophthalmology

## 2023-02-07 DIAGNOSIS — I10 Essential (primary) hypertension: Secondary | ICD-10-CM

## 2023-02-07 DIAGNOSIS — Z7985 Long-term (current) use of injectable non-insulin antidiabetic drugs: Secondary | ICD-10-CM

## 2023-02-07 DIAGNOSIS — H25813 Combined forms of age-related cataract, bilateral: Secondary | ICD-10-CM

## 2023-02-07 DIAGNOSIS — H35033 Hypertensive retinopathy, bilateral: Secondary | ICD-10-CM

## 2023-02-07 DIAGNOSIS — E113413 Type 2 diabetes mellitus with severe nonproliferative diabetic retinopathy with macular edema, bilateral: Secondary | ICD-10-CM

## 2023-02-07 DIAGNOSIS — Z7984 Long term (current) use of oral hypoglycemic drugs: Secondary | ICD-10-CM

## 2023-02-07 DIAGNOSIS — Z794 Long term (current) use of insulin: Secondary | ICD-10-CM

## 2023-02-16 NOTE — Progress Notes (Signed)
[facility] - Clinic Note  02/17/2023     CHIEF COMPLAINT Patient presents for Retina Follow Up  HISTORY OF PRESENT ILLNESS: Philip Wilson is a 52 y.o. male who presents to the clinic today for:   HPI     Retina Follow Up   Patient presents with  Diabetic Retinopathy.  In both eyes.  Severity is moderate.  Duration of 13 weeks.  I, the attending physician,  performed the HPI with the patient and updated documentation appropriately.        Comments   Patient feels the vision is stable. He is not using eye drops. His blood sugar was 98. He has lost 36 lbs by reducing sugar from his diet      Last edited by Rennis Chris, MD on 02/17/2023  3:07 PM.    Patient vision is stable  Referring physician: Blane Ohara, MD [address] [place],  [place] [address]  HISTORICAL INFORMATION:   Selected notes from the MEDICAL RECORD NUMBER Referred by Dr. Tomma Lightning for diabetic retinopathy OU   CURRENT MEDICATIONS: No current outpatient medications on file. (Ophthalmic Drugs)   Current Facility-Administered Medications (Ophthalmic Drugs)  Medication Route   aflibercept (EYLEA) SOLN 2 mg Intravitreal   Current Outpatient Medications (Other)  Medication Sig   AgaMatrix Ultra-Thin Lancets MISC 200 each by Misc.(Non-Drug; Combo Route) route 4 times daily. One touch delica   amLODipine (NORVASC) 5 MG tablet Take 5 mg by mouth daily.   aspirin 81 MG EC tablet Take by mouth.   atorvastatin (LIPITOR) 40 MG tablet TAKE 1 TABLET(40 MG) BY MOUTH DAILY   BD PEN NEEDLE NANO 2ND GEN 32G X 4 MM MISC 4 (four) times daily. as directed   Blood Glucose Monitoring Suppl (FIFTY50 GLUCOSE METER 2.0) w/Device KIT 1 each by Other route 4 times daily. Use as instructed one touch ultra   glucose blood (ACCU-CHEK GUIDE) test strip Check blood sugar 3 times daily   insulin aspart (NOVOLOG FLEXPEN) 100 UNIT/ML FlexPen Inject 20 Units into the skin 3 (three) times daily  with meals.   Insulin Glargine (BASAGLAR KWIKPEN) 100 UNIT/ML Inject into the skin.   labetalol (NORMODYNE) 200 MG tablet Take 400 mg by mouth 2 (two) times daily.   losartan (COZAAR) 25 MG tablet Take 25 mg by mouth daily.   mycophenolate (MYFORTIC) 180 MG EC tablet Take 360 mg by mouth 2 (two) times daily.   OZEMPIC, 2 MG/DOSE, 8 MG/3ML SOPN INJECT 2 MGS UNDER THE SKIN ONCE WEEKLY   pantoprazole (PROTONIX) 40 MG tablet TAKE 1 TABLET(40 MG) BY MOUTH DAILY   predniSONE (DELTASONE) 5 MG tablet Take 1 tablet (5 mg total) by mouth daily with breakfast.   sildenafil (REVATIO) 20 MG tablet Take 3 to 5 tablets 1 hour prior to intercourse.  No more than 24 hours.   tacrolimus (PROGRAF) 1 MG capsule Take 1 mg by mouth 2 (two) times daily.   TRESIBA FLEXTOUCH 200 UNIT/ML FlexTouch Pen ADMINISTER 48 UNITS UNDER THE SKIN DAILY   valGANciclovir (VALCYTE) 450 MG tablet Indications: treatment to prevent cytomegalovirus disease   VASCEPA 1 g capsule TAKE 2 CAPSULES(2 GRAMS) BY MOUTH TWICE DAILY   Current Facility-Administered Medications (Other)  Medication Route   Bevacizumab (AVASTIN) SOLN 1.25 mg Intravitreal   REVIEW OF SYSTEMS: ROS   Positive for: Neurological, Genitourinary, Endocrine, Eyes Negative for: Constitutional, Gastrointestinal, Skin, Musculoskeletal, HENT, Cardiovascular, Respiratory, Psychiatric, Allergic/Imm, Heme/Lymph Last edited by Charlette Caffey, COT  on 02/17/2023  1:13 PM.     ALLERGIES No Known Allergies  PAST MEDICAL HISTORY Past Medical History:  Diagnosis Date   Cataract    OU   Chronic kidney disease 05/04/2018   Chronic kidney disease, stage 3a (HCC) 05/29/2022   Diabetes 1.5, managed as type 2 (HCC)    Diabetic retinopathy (HCC)    NPDR OU   Hypertension    Hypertensive retinopathy    OU   Past Surgical History:  Procedure Laterality Date   AV FISTULA PLACEMENT Left 09/28/2018   Procedure: Creation of Left arm Radiocephalic ARTERIOVENOUS (AV) FISTULA;   Surgeon: Cephus Shelling, MD;  Location: [facility] OR;  Service: Vascular;  Laterality: Left;   KIDNEY TRANSPLANT  04/02/2019   MULTIPLE TOOTH EXTRACTIONS     FAMILY HISTORY Family History  Problem Relation Age of Onset   Heart failure Mother    SOCIAL HISTORY Social History   Tobacco Use   Smoking status: Former   Smokeless tobacco: Never   Tobacco comments:    quit 20 years ago  Vaping Use   Vaping status: Never Used  Substance Use Topics   Alcohol use: Not Currently   Drug use: Never       OPHTHALMIC EXAM:  Base Eye Exam     Visual Acuity (Snellen - Linear)       Right Left   Dist cc 20/25 20/25   Dist ph cc NI 20/20 +1    Correction: Glasses         Tonometry (Tonopen, 1:19 PM)       Right Left   Pressure 11 14         Pupils       Dark Light Shape React APD   Right 3 2 Round Brisk None   Left 3 2 Round Brisk None         Visual Fields       Left Right    Full Full         Extraocular Movement       Right Left    Full, Ortho Full, Ortho         Neuro/Psych     Oriented x3: Yes   Mood/Affect: Normal         Dilation     Both eyes: 1.0% Mydriacyl, 2.5% Phenylephrine @ 1:14 PM           Slit Lamp and Fundus Exam     Slit Lamp Exam       Right Left   Lids/Lashes Dermatochalasis - upper lid, mild Meibomian gland dysfunction Dermatochalasis - upper lid, mild Meibomian gland dysfunction   Conjunctiva/Sclera White and quiet White and quiet   Cornea trace PEE trace PEE   Anterior Chamber Deep and quiet, no cell or flare Deep and quiet, no cell or flare   Iris Round and dilated to 7mm Round and dilated to 7mm   Lens 1+ Nuclear sclerosis, 2+ Cortical cataract 1+ Nuclear sclerosis, 2+ Cortical cataract   Anterior Vitreous Mild Vitreous syneresis, Posterior vitreous detachment, Weiss ring Mild Vitreous syneresis, old white VH inferiorly         Fundus Exam       Right Left   Disc Pink and Sharp Pink and Sharp, Compact    C/D Ratio 0.2 0.3   Macula good foveal reflex, mild ERM greatest superior to fovea, focal cystic changes ST macula -- slightly improved, no heme Flat, good foveal reflex, mild ERM, No  heme or edema   Vessels attenuated, Tortuous attenuated, Tortuous   Periphery Attached, rare MAs, No RT/RD Attached, rare MA           IMAGING AND PROCEDURES  Imaging and Procedures for @TODAY @  OCT, Retina - OU - Both Eyes       Right Eye Quality was good. Central Foveal Thickness: 248. Progression has improved. Findings include no SRF, abnormal foveal contour, retinal drusen , intraretinal hyper-reflective material, epiretinal membrane, intraretinal fluid, macular pucker (Mild Interval improvement in cystic changes and edema ST macula; trace ERM and macular pucker).   Left Eye Quality was good. Central Foveal Thickness: 244. Progression has been stable. Findings include normal foveal contour, no IRF, no SRF, intraretinal hyper-reflective material, epiretinal membrane (Trace cystic changes stably improved).   Notes *Images captured and stored on drive  Diagnosis / Impression:  OD: Mild Interval improvement in cystic changes and edema ST macula; trace ERM and macular pucker OS: NFP, no IRF/SRF -- no DME  Clinical management:  See below  Abbreviations: NFP - Normal foveal profile. CME - cystoid macular edema. PED - pigment epithelial detachment. IRF - intraretinal fluid. SRF - subretinal fluid. EZ - ellipsoid zone. ERM - epiretinal membrane. ORA - outer retinal atrophy. ORT - outer retinal tubulation. SRHM - subretinal hyper-reflective material      Intravitreal Injection, Pharmacologic Agent - OD - Right Eye       Time Out 02/17/2023. 2:01 PM. Confirmed correct patient, procedure, site, and patient consented.   Anesthesia Topical anesthesia was used. Anesthetic medications included Lidocaine 2%, Proparacaine 0.5%.   Procedure Preparation included 5% betadine to ocular surface, eyelid  speculum. A (32g) needle was used.   Injection: 2 mg aflibercept 2 MG/0.05ML   Route: Intravitreal, Site: Right Eye   NDC: L6038910, Lot: [id number], Expiration date: 07/02/2024, Waste: 0 mL   Post-op Post injection exam found visual acuity of at least counting fingers. The patient tolerated the procedure well. There were no complications. The patient received written and verbal post procedure care education. Post injection medications were not given.            ASSESSMENT/PLAN:    ICD-10-CM   1. 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 Current use of insulin (HCC)  Z79.4     3. Long-term (current) use of injectable non-insulin antidiabetic drugs  Z79.85     4. Long term (current) use of oral hypoglycemic drugs  Z79.84     5. Essential hypertension  I10     6. Hypertensive retinopathy of both eyes  H35.033     7. Combined forms of age-related cataract of both eyes  H25.813      1-4. Severe Non-proliferative diabetic retinopathy, both eyes  - delayed f/u -- 13 wks instead of 12  - delayed to f/u from 10-11 weeks to 18 weeks (10.03.23 - 02.06.24)  - last A1c was 6.5 on 09/30/22, 7.2 on 02.22.24; 7.0 on 11.07.23; 7.4 on 06.13.23  - initial exam showed massive central DME OU and scattered IRH and IRMA - FA 6.5.19 with patches of capillary nonperfusion; extensive Mas with late leakage OU; no frank NV  - FA 01.02.20, shows improvement in late leaking microaneurysms OU - S/P IVA OD #1 (06.07.19), #  2 (07.08.19), #3 (07.08.19) --  IVA resistance - S/P IVE OD #1 (09.03.19), #2 (10.03.19), #3 (11.04.19), #4 (12.02.19), #5 (01.02.20), #6 (02.07.20), #7 (03.16.20), #8 (05.20.20), #9 (06.22.20), #10 (07.27.20), #11(09.09.10), #12 (10.28.20), #13 (11.25.20), #14  (04.06.21) - sample, #15 (05.04.21) - sample, #16 (06.18.21), #17 (07.30.21), #18 (09.10.21), #19 (11.16.21), #20 (1.7.22), #21 (4.12.22), #22 (06.07.22), #23 (8.1.22), #24 (10.11.22), #25 (12.6.22), #26 (1.31.23), #27 (04.25.23), #28 (07.07.23), #29 (10.03.23), #30 (02.06.24), #31 (05.07.24), #32 (08.06.24) ==========================================================  - S/P IVA OS #1 (06.05.19), #2 (07.08.19), #3 (07.08.19), #4 (09.03.19) - S/P IVE OS #1 (10.03.19), #2 (11.04.19), #3 (12.02.19), #4 (01.02.20), #5 (02.07.20), #6 (03.16.20), #7 (05.20.20), #8 (06.22.20),#9 (07.27.20), #10 (09.09.20), #11 (10.28.20), #12 (11.25.20) -- IVA resistance =========================================================== - OCT shows OD: Mild Interval improvement in cystic changes and edema ST macula; +ERM and macular pucker at 14 weeks; OS: NFP, no IRF/SRF -- no DME  - BCVA: OD 20/25 - stable, OS 20/20 from 20/25  - recommend IVE OD #33 today, 11.15.24 w/ f/u in 13-14 wks  - will hold off on injection OS again today  - pt in agreement  - RBA of procedure discussed, questions answered  - informed consent obtained and signed  - see procedure note -- tolerated well - Eylea paperwork and benefits investigation started on 08.05.19 -- approved for 2024  - f/u in 13-14 weeks -- DFE/OCT/possible injection  5,6. Hypertensive retinopathy OU  - discussed importance of tight BP control  - had some Bps in hypertensive emergency range (200s / 110s)  - BP improved post kidney transplant  - discussed likely contribution to DME  - monitor   - BP management per nephrology, PCP and cardiology            7. 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  - discussed diagnosis and progression  - not yet visually significant  - monitor for now  Ophthalmic Meds Ordered this visit:  Meds ordered this encounter  Medications    aflibercept (EYLEA) SOLN 2 mg     Return for f/u 13-14 weeks, NPDR OU, DFE, OCT.  There are no Patient Instructions on file for this visit. This document serves as a record of services personally performed by Karie Chimera, MD, PhD. It was created on their behalf by Berlin Hun COT, an ophthalmic technician. The creation of this record is the provider's dictation and/or activities during the visit.    Electronically signed by: Berlin Hun COT 11.14.24 2:44 AM  This document serves as a record of services personally performed by Karie Chimera, MD, PhD. It was created on their behalf by Glee Arvin. Manson Passey, OA an ophthalmic technician. The creation of this record is the provider's dictation and/or activities during the visit.    Electronically signed by: Glee Arvin. Manson Passey, OA 02/19/23 2:44 AM  Karie Chimera, M.D., Ph.D. Diseases & Surgery of the Retina and Vitreous [facility]  I have reviewed the above documentation for accuracy and completeness, and I agree with the above. Karie Chimera, M.D., Ph.D. 02/19/23 2:47 AM   Abbreviations: M myopia (nearsighted); A astigmatism; H hyperopia (farsighted); P presbyopia; Mrx spectacle prescription;  CTL contact lenses; OD right eye; OS left eye; OU both eyes  XT exotropia; ET esotropia; PEK punctate epithelial keratitis; PEE punctate epithelial erosions; DES dry eye syndrome; MGD meibomian gland dysfunction; ATs artificial tears; PFAT's preservative free artificial tears;  NSC nuclear sclerotic cataract; PSC posterior subcapsular cataract; ERM epi-retinal membrane; PVD posterior vitreous detachment; RD retinal detachment; DM diabetes mellitus; DR diabetic retinopathy; NPDR non-proliferative diabetic retinopathy; PDR proliferative diabetic retinopathy; CSME clinically significant macular edema; DME diabetic macular edema; dbh dot blot hemorrhages; CWS cotton wool spot; POAG primary open angle glaucoma; C/D  cup-to-disc ratio; HVF humphrey visual field; GVF goldmann visual field; OCT optical coherence tomography; IOP intraocular pressure; BRVO Branch retinal vein occlusion; CRVO central retinal vein occlusion; CRAO central retinal artery occlusion; BRAO branch retinal artery occlusion; RT retinal tear; SB scleral buckle; PPV pars plana vitrectomy; VH Vitreous hemorrhage; PRP panretinal laser photocoagulation; IVK intravitreal kenalog; VMT vitreomacular traction; MH Macular hole;  NVD neovascularization of the disc; NVE neovascularization elsewhere; AREDS age related eye disease study; ARMD age related macular degeneration; POAG primary open angle glaucoma; EBMD epithelial/anterior basement membrane dystrophy; ACIOL anterior chamber intraocular lens; IOL intraocular lens; PCIOL posterior chamber intraocular lens; Phaco/IOL phacoemulsification with intraocular lens placement; PRK photorefractive keratectomy; LASIK laser assisted in situ keratomileusis; HTN hypertension; DM diabetes mellitus; COPD chronic obstructive pulmonary disease

## 2023-02-17 ENCOUNTER — Encounter (INDEPENDENT_AMBULATORY_CARE_PROVIDER_SITE_OTHER): Payer: Self-pay | Admitting: Ophthalmology

## 2023-02-17 ENCOUNTER — Other Ambulatory Visit: Payer: Self-pay | Admitting: Family Medicine

## 2023-02-17 ENCOUNTER — Ambulatory Visit (INDEPENDENT_AMBULATORY_CARE_PROVIDER_SITE_OTHER): Payer: BC Managed Care – PPO | Admitting: Ophthalmology

## 2023-02-17 DIAGNOSIS — Z794 Long term (current) use of insulin: Secondary | ICD-10-CM | POA: Diagnosis not present

## 2023-02-17 DIAGNOSIS — Z7984 Long term (current) use of oral hypoglycemic drugs: Secondary | ICD-10-CM

## 2023-02-17 DIAGNOSIS — E1121 Type 2 diabetes mellitus with diabetic nephropathy: Secondary | ICD-10-CM

## 2023-02-17 DIAGNOSIS — E113413 Type 2 diabetes mellitus with severe nonproliferative diabetic retinopathy with macular edema, bilateral: Secondary | ICD-10-CM

## 2023-02-17 DIAGNOSIS — Z7985 Long-term (current) use of injectable non-insulin antidiabetic drugs: Secondary | ICD-10-CM | POA: Diagnosis not present

## 2023-02-17 DIAGNOSIS — I10 Essential (primary) hypertension: Secondary | ICD-10-CM

## 2023-02-17 DIAGNOSIS — H25813 Combined forms of age-related cataract, bilateral: Secondary | ICD-10-CM

## 2023-02-17 DIAGNOSIS — H35033 Hypertensive retinopathy, bilateral: Secondary | ICD-10-CM

## 2023-02-17 MED ORDER — AFLIBERCEPT 2MG/0.05ML IZ SOLN FOR KALEIDOSCOPE
2.0000 mg | INTRAVITREAL | Status: AC | PRN
Start: 2023-02-17 — End: 2023-02-17
  Administered 2023-02-17: 2 mg via INTRAVITREAL

## 2023-03-22 ENCOUNTER — Other Ambulatory Visit: Payer: Self-pay | Admitting: Family Medicine

## 2023-03-22 DIAGNOSIS — K2100 Gastro-esophageal reflux disease with esophagitis, without bleeding: Secondary | ICD-10-CM

## 2023-05-15 ENCOUNTER — Other Ambulatory Visit: Payer: Self-pay | Admitting: Family Medicine

## 2023-05-15 DIAGNOSIS — E088 Diabetes mellitus due to underlying condition with unspecified complications: Secondary | ICD-10-CM

## 2023-05-24 ENCOUNTER — Ambulatory Visit: Payer: BC Managed Care – PPO | Admitting: Family Medicine

## 2023-05-26 ENCOUNTER — Encounter (INDEPENDENT_AMBULATORY_CARE_PROVIDER_SITE_OTHER): Payer: 59 | Admitting: Ophthalmology

## 2023-06-06 ENCOUNTER — Other Ambulatory Visit: Payer: Self-pay | Admitting: Family Medicine

## 2023-06-07 ENCOUNTER — Encounter: Payer: Self-pay | Admitting: Family Medicine

## 2023-06-07 ENCOUNTER — Ambulatory Visit: Payer: BC Managed Care – PPO | Admitting: Family Medicine

## 2023-06-07 VITALS — BP 120/68 | HR 84 | Temp 97.6°F | Ht 70.0 in | Wt 160.0 lb

## 2023-06-07 DIAGNOSIS — Z1211 Encounter for screening for malignant neoplasm of colon: Secondary | ICD-10-CM | POA: Diagnosis not present

## 2023-06-07 DIAGNOSIS — E1121 Type 2 diabetes mellitus with diabetic nephropathy: Secondary | ICD-10-CM | POA: Diagnosis not present

## 2023-06-07 DIAGNOSIS — I129 Hypertensive chronic kidney disease with stage 1 through stage 4 chronic kidney disease, or unspecified chronic kidney disease: Secondary | ICD-10-CM | POA: Diagnosis not present

## 2023-06-07 DIAGNOSIS — Z23 Encounter for immunization: Secondary | ICD-10-CM

## 2023-06-07 DIAGNOSIS — E782 Mixed hyperlipidemia: Secondary | ICD-10-CM

## 2023-06-07 DIAGNOSIS — Z940 Kidney transplant status: Secondary | ICD-10-CM

## 2023-06-07 DIAGNOSIS — K219 Gastro-esophageal reflux disease without esophagitis: Secondary | ICD-10-CM

## 2023-06-07 NOTE — Progress Notes (Unsigned)
 Subjective:  Patient ID: Philip Wilson, male    DOB: 27-Dec-1970  Age: 53 y.o. MRN: [id number]  Chief Complaint  Patient presents with   Medical Management of Chronic Issues   Discussed the use of AI scribe software for clinical note transcription with the patient, who gave verbal consent to proceed.  History of Present Illness   The patient, with a history of diabetes, hypertension, and kidney transplant, reports significant improvements in his health due to lifestyle changes and medication management. He has lost a substantial amount of weight, from 232 pounds to around 160-155 pounds, through increased physical activity and dietary modifications. The patient reports that his blood sugar levels have been well-controlled, with recent readings ranging from 89 to 120. He attributes this improvement to the use of Ozempic and his own self-monitoring of his blood sugar levels.  The patient also reports changes in his blood pressure medication regimen due to episodes of low blood pressure. He has stopped taking Losartan and has reduced his Labetalol dosage to 200mg  twice a day. He continues to take Amlodipine 5mg  once a day and a daily baby aspirin. Changes were recommended by nephrologist. The patient also takes Pantoprazole for acid reflux, but reports minimal symptoms.  In addition to his diabetes and hypertension management, the patient is on immunosuppressive medications, including Prograf and Valcyte, due to his kidney transplant. He reports no issues related to his transplant.      HPI Diabetes:  Complications:  HTN Glucose checking: before meals  Glucose logs: 89-120 Hypoglycemia: none Most recent A1C: 6.1% Current medications: Tresiba 48 units daily, Novolog 10 units before supper, ozempic 2 mg weekly. Patient has lost 55 lbs. Doing great.  Last Eye Exam: Due Foot checks: yes Has macular degeneration   Hyperlipidemia: Current medications: Atorvastatin 40 mg one on M,W, F,  vascepa 2 g BID.   Hypertension with ckd stage 3a: s/p renal transplant. On prednisone and prograf. Current medications: amlodipine 5 mg daily, Dr. Malen Gauze stopped Losartan 25 mg daily and decreased Labetalol 200 mg twice a day, aspirin 81 mg daily.   GERD: Currently taking pantoprazole 40 mg daily.    Diet: Does not avoid any foods, but the portion size is less. Exercise: walking.     01/19/2023    9:11 AM 05/26/2022    8:10 AM 03/23/2021   10:48 AM  Depression screen PHQ 2/9  Decreased Interest 0 0 0  Down, Depressed, Hopeless 0 0 0  PHQ - 2 Score 0 0 0  Altered sleeping 0 0   Tired, decreased energy 0 0   Change in appetite 0 0   Feeling bad or failure about yourself  0 0   Trouble concentrating 0 0   Moving slowly or fidgety/restless 0 0   Suicidal thoughts 0 0   PHQ-9 Score 0 0   Difficult doing work/chores Not difficult at all Not difficult at all         01/19/2023    9:11 AM  Fall Risk   Falls in the past year? 0  Number falls in past yr: 0  Injury with Fall? 0  Risk for fall due to : No Fall Risks  Follow up Falls evaluation completed    Patient Care Team: Blane Ohara, MD as PCP - General (Family Medicine) Roger Kill, NP as Nurse Practitioner (Nephrology)   Review of Systems  Constitutional:  Negative for chills, diaphoresis, fatigue and fever.  HENT:  Negative for congestion, ear pain and sore  throat.   Respiratory:  Negative for cough and shortness of breath.   Cardiovascular:  Negative for chest pain and leg swelling.  Gastrointestinal:  Negative for abdominal pain, constipation, diarrhea, nausea and vomiting.  Genitourinary:  Negative for dysuria and urgency.  Musculoskeletal:  Negative for arthralgias and myalgias.  Neurological:  Negative for dizziness and headaches.  Psychiatric/Behavioral:  Negative for dysphoric mood.     Current Outpatient Medications on File Prior to Visit  Medication Sig Dispense Refill   AgaMatrix Ultra-Thin Lancets  MISC 200 each by Misc.(Non-Drug; Combo Route) route 4 times daily. One touch delica     amLODipine (NORVASC) 5 MG tablet Take 5 mg by mouth daily.     aspirin 81 MG EC tablet Take by mouth.     atorvastatin (LIPITOR) 40 MG tablet TAKE 1 TABLET(40 MG) BY MOUTH DAILY 90 tablet 0   BD PEN NEEDLE NANO 2ND GEN 32G X 4 MM MISC 4 (four) times daily. as directed     Blood Glucose Monitoring Suppl (FIFTY50 GLUCOSE METER 2.0) w/Device KIT 1 each by Other route 4 times daily. Use as instructed one touch ultra     glucose blood (ACCU-CHEK GUIDE) test strip Check blood sugar 3 times daily 100 strip 6   insulin aspart (NOVOLOG FLEXPEN) 100 UNIT/ML FlexPen Inject 20 Units into the skin 3 (three) times daily with meals. 54 mL 3   Insulin Glargine (BASAGLAR KWIKPEN) 100 UNIT/ML Inject into the skin.     labetalol (NORMODYNE) 200 MG tablet Take 200 mg by mouth 2 (two) times daily.     mycophenolate (MYFORTIC) 180 MG EC tablet Take 360 mg by mouth 2 (two) times daily.     OZEMPIC, 2 MG/DOSE, 8 MG/3ML SOPN INJECT 2 MGS UNDER THE SKIN ONCE WEEKLY 9 mL 0   pantoprazole (PROTONIX) 40 MG tablet TAKE 1 TABLET(40 MG) BY MOUTH DAILY 90 tablet 1   predniSONE (DELTASONE) 5 MG tablet Take 1 tablet (5 mg total) by mouth daily with breakfast. 1 tablet 0   sildenafil (REVATIO) 20 MG tablet Take 3 to 5 tablets 1 hour prior to intercourse.  No more than 24 hours. 50 tablet 0   tacrolimus (PROGRAF) 1 MG capsule Take 1 mg by mouth 2 (two) times daily.     TRESIBA FLEXTOUCH 200 UNIT/ML FlexTouch Pen ADMINISTER 48 UNITS UNDER THE SKIN DAILY 9 mL 1   valGANciclovir (VALCYTE) 450 MG tablet Indications: treatment to prevent cytomegalovirus disease     VASCEPA 1 g capsule TAKE 2 CAPSULES(2 GRAMS) BY MOUTH TWICE DAILY 360 capsule 0   Current Facility-Administered Medications on File Prior to Visit  Medication Dose Route Frequency Provider Last Rate Last Admin   aflibercept (EYLEA) SOLN 2 mg  2 mg Intravitreal  Rennis Chris, MD   2 mg at  05/13/18 2255   Bevacizumab (AVASTIN) SOLN 1.25 mg  1.25 mg Intravitreal  Rennis Chris, MD   1.25 mg at 09/06/17 1312   Past Medical History:  Diagnosis Date   Cataract    OU   Chronic kidney disease 05/04/2018   Chronic kidney disease, stage 3a (HCC) 05/29/2022   Diabetes 1.5, managed as type 2 (HCC)    Diabetic retinopathy (HCC)    NPDR OU   Hypertension    Hypertensive retinopathy    OU   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  Added automatically from request for surgery [phone]  Past Surgical History:  Procedure Laterality Date   AV FISTULA PLACEMENT Left 09/28/2018   Procedure: Creation of Left arm Radiocephalic ARTERIOVENOUS (AV) FISTULA;  Surgeon: Cephus Shelling, MD;  Location: [facility] OR;  Service: Vascular;  Laterality: Left;   KIDNEY TRANSPLANT  04/02/2019   MULTIPLE TOOTH EXTRACTIONS      Family History  Problem Relation Age of Onset   Heart failure Mother    Social History   Socioeconomic History   Marital status: Married    Spouse name: Not on file   Number of children: Not on file   Years of education: Not on file   Highest education level: Not on file  Occupational History   Occupation: sales  Tobacco Use   Smoking status: Former   Smokeless tobacco: Never   Tobacco comments:    quit 20 years ago  Vaping Use   Vaping status: Never Used  Substance and Sexual Activity   Alcohol use: Not Currently   Drug use: Never   Sexual activity: Yes    Partners: Female  Other Topics Concern   Not on file  Social History Narrative   Not on file   Social Drivers of Health   Financial Resource Strain: Low Risk  (05/26/2022)   Overall Financial Resource Strain (CARDIA)    Difficulty of Paying Living Expenses: Not hard at all  Food Insecurity: No Food Insecurity (06/07/2023)   Hunger Vital Sign    Worried About Running Out of Food in the Last Year: Never true    Ran Out of Food in the Last  Year: Never true  Transportation Needs: No Transportation Needs (06/07/2023)   PRAPARE - Administrator, Civil Service (Medical): No    Lack of Transportation (Non-Medical): No  Physical Activity: Inactive (05/26/2022)   Exercise Vital Sign    Days of Exercise per Week: 0 days    Minutes of Exercise per Session: 0 min  Stress: No Stress Concern Present (05/26/2022)   [employer] of Occupational Health - Occupational Stress Questionnaire    Feeling of Stress : Not at all  Social Connections: Moderately Integrated (05/26/2022)   Social Connection and Isolation Panel [NHANES]    Frequency of Communication with Friends and Family: Twice a week    Frequency of Social Gatherings with Friends and Family: Twice a week    Attends Religious Services: 1 to 4 times per year    Active Member of [employer] or Organizations: No    Attends Engineer, structural: Never    Marital Status: Married    Objective:  BP 120/68   Pulse 84   Temp 97.6 F (36.4 C)   Ht 5\' 10"  (1.778 m)   Wt 160 lb (72.6 kg)   SpO2 98%   BMI 22.96 kg/m      06/07/2023    1:54 PM 01/19/2023    9:08 AM 09/30/2022   10:03 AM  BP/Weight  Systolic BP 120 132 98  Diastolic BP 68 70 62  Wt. (Lbs) 160 177 203  BMI 22.96 kg/m2 25.4 kg/m2 29.13 kg/m2    Physical Exam Vitals reviewed.  Constitutional:      Appearance: Normal appearance. He is normal weight.  Cardiovascular:     Rate and Rhythm: Normal rate and regular rhythm.     Heart sounds: No murmur heard. Pulmonary:     Effort: Pulmonary effort is normal.     Breath sounds: Normal breath sounds.  Abdominal:     General:  Abdomen is flat. Bowel sounds are normal.     Palpations: Abdomen is soft.     Tenderness: There is no abdominal tenderness.  Neurological:     Mental Status: He is alert and oriented to person, place, and time.  Psychiatric:        Mood and Affect: Mood normal.        Behavior: Behavior normal.     Diabetic Foot Exam -  Simple   Simple Foot Form Diabetic Foot exam was performed with the following findings: Yes 06/07/2023  2:00 PM  Visual Inspection No deformities, no ulcerations, no other skin breakdown bilaterally: Yes Sensation Testing Intact to touch and monofilament testing bilaterally: Yes Pulse Check Posterior Tibialis and Dorsalis pulse intact bilaterally: Yes Comments      Lab Results  Component Value Date   WBC 7.7 09/30/2022   HGB 14.3 09/30/2022   HCT 42.3 09/30/2022   PLT 248 09/30/2022   GLUCOSE 135 (H) 01/19/2023   CHOL 113 01/19/2023   TRIG 116 01/19/2023   HDL 47 01/19/2023   LDLCALC 45 01/19/2023   ALT 40 01/19/2023   AST 30 01/19/2023   NA 142 01/19/2023   K 4.5 01/19/2023   CL 103 01/19/2023   CREATININE 1.34 (H) 01/19/2023   BUN 16 01/19/2023   CO2 24 01/19/2023   INR 1.0 05/29/2018   HGBA1C 6.1 (H) 01/19/2023      Assessment & Plan:  Diabetic glomerulopathy (HCC) Assessment & Plan: Control: improved. Recommend check sugars fasting daily. Recommend check feet daily. Recommend annual eye exams. Improved glycemic control with A1c at 5.6. Reduced short-acting insulin use and significant weight loss attributed to Ozempic and increased activity. - Continue current insulin regimen with reduced short-acting insulin as needed before dinner. - Continue Ozempic for glycemic control. - Monitor blood glucose levels regularly. Continue to work on eating a healthy diet and exercise.       Hypertensive kidney disease, benign, stage 1-4 or unspecified chronic kidney disease Assessment & Plan: Improved blood pressure control after stopping losartan and reducing labetalol.  The current medical regimen is effective;  continue present plan and medications.   Orders: -     CBC with Differential/Platelet  Mixed hyperlipidemia Assessment & Plan: Fairly well controlled.  No changes to medicines. On Atorvastatin 40 mg one on M,W, F, vascepa 2 g BID. Continue to work on  eating a healthy diet and exercise.  Labs drawn today.    Orders: -     Lipid panel  Screening for colon cancer -     Cologuard  Encounter for immunization -     Pneumococcal conjugate vaccine 20-valent  GERD without esophagitis Assessment & Plan: Minimal symptoms with occasional nausea. Weight loss may reduce medication need. - Continue pantoprazole as needed. - Consider discontinuing pantoprazole if symptoms remain minimal.   Living-donor kidney transplant recipient Assessment & Plan: Well-managed with ongoing immunosuppressive therapy and no complications. - Continue current immunosuppressive regimen.                No orders of the defined types were placed in this encounter.   Orders Placed This Encounter  Procedures   Pneumococcal conjugate vaccine 20-valent   CBC with Differential/Platelet   Lipid panel   Cologuard     Follow-up: Return in about 3 months (around 09/07/2023) for chronic fasting.   Clayborn Bigness I Leal-Borjas,acting as a scribe for Blane Ohara, MD.,have documented all relevant documentation on the behalf of Blane Ohara, MD,as directed by  Blane Ohara, MD while in the presence of Blane Ohara, MD.   An After Visit Summary was printed and given to the patient.  I attest that I have reviewed this visit and agree with the plan scribed by my staff.   Blane Ohara, MD [facility] [phone]

## 2023-06-07 NOTE — Patient Instructions (Signed)
 No changes to bp medicines since Dr. Malen Gauze is the one who changed your doses.  You do not need to check bp daily unless you feel poorly.

## 2023-06-10 NOTE — Assessment & Plan Note (Addendum)
 Control: improved. Recommend check sugars fasting daily. Recommend check feet daily. Recommend annual eye exams. Medicines: Tresiba 48 units daily, Metformin XR 500 mg Take 2 tablets every day, Novolog 20 units three times a day based on sliding scale. weekly. Hope to decrease insulin requirements, Ozempic 2 mg. Continue to work on eating a healthy diet and exercise.

## 2023-06-10 NOTE — Assessment & Plan Note (Signed)
 Management per specialist.  The current medical regimen is effective;  continue present plan and medications.

## 2023-06-10 NOTE — Assessment & Plan Note (Signed)
 Fairly well controlled.  No changes to medicines. On Atorvastatin 40 mg one on M,W, F, vascepa 2 g BID. Continue to work on eating a healthy diet and exercise.  Labs drawn today.

## 2023-06-11 ENCOUNTER — Encounter: Payer: Self-pay | Admitting: Family Medicine

## 2023-06-11 DIAGNOSIS — K219 Gastro-esophageal reflux disease without esophagitis: Secondary | ICD-10-CM | POA: Insufficient documentation

## 2023-06-11 DIAGNOSIS — Z23 Encounter for immunization: Secondary | ICD-10-CM | POA: Insufficient documentation

## 2023-06-11 NOTE — Assessment & Plan Note (Signed)
 Minimal symptoms with occasional nausea. Weight loss may reduce medication need. - Continue pantoprazole as needed. - Consider discontinuing pantoprazole if symptoms remain minimal.

## 2023-06-11 NOTE — Assessment & Plan Note (Signed)
 Well-managed with ongoing immunosuppressive therapy and no complications. - Continue current immunosuppressive regimen.

## 2023-06-12 LAB — CBC WITH DIFFERENTIAL/PLATELET
Basophils Absolute: 0 10*3/uL (ref 0.0–0.2)
Basos: 0 %
EOS (ABSOLUTE): 0.1 10*3/uL (ref 0.0–0.4)
Eos: 2 %
Hematocrit: 41.9 % (ref 37.5–51.0)
Hemoglobin: 13.8 g/dL (ref 13.0–17.7)
Immature Grans (Abs): 0 10*3/uL (ref 0.0–0.1)
Immature Granulocytes: 0 %
Lymphocytes Absolute: 2.7 10*3/uL (ref 0.7–3.1)
Lymphs: 29 %
MCH: 28.9 pg (ref 26.6–33.0)
MCHC: 32.9 g/dL (ref 31.5–35.7)
MCV: 88 fL (ref 79–97)
Monocytes Absolute: 0.6 10*3/uL (ref 0.1–0.9)
Monocytes: 7 %
Neutrophils Absolute: 5.7 10*3/uL (ref 1.4–7.0)
Neutrophils: 62 %
Platelets: 260 10*3/uL (ref 150–450)
RBC: 4.78 x10E6/uL (ref 4.14–5.80)
RDW: 12.3 % (ref 11.6–15.4)
WBC: 9.2 10*3/uL (ref 3.4–10.8)

## 2023-06-12 LAB — SPECIMEN STATUS REPORT

## 2023-06-12 LAB — LIPID PANEL
Chol/HDL Ratio: 2.5 ratio (ref 0.0–5.0)
Cholesterol, Total: 120 mg/dL (ref 100–199)
HDL: 48 mg/dL (ref >39)
LDL Chol Calc (NIH): 51 mg/dL (ref 0–99)
Triglycerides: 117 mg/dL (ref 0–149)
VLDL Cholesterol Cal: 21 mg/dL (ref 5–40)

## 2023-06-12 LAB — MICROALBUMIN / CREATININE URINE RATIO

## 2023-06-13 ENCOUNTER — Other Ambulatory Visit: Payer: Self-pay

## 2023-06-13 ENCOUNTER — Encounter: Payer: Self-pay | Admitting: Family Medicine

## 2023-06-13 DIAGNOSIS — E1121 Type 2 diabetes mellitus with diabetic nephropathy: Secondary | ICD-10-CM

## 2023-06-13 MED ORDER — AGAMATRIX ULTRA-THIN LANCETS MISC: 5 refills | Status: AC

## 2023-06-16 ENCOUNTER — Other Ambulatory Visit: Payer: Self-pay

## 2023-06-16 MED ORDER — BD PEN NEEDLE NANO 2ND GEN 32G X 4 MM MISC: 3 refills | Status: AC

## 2023-06-23 ENCOUNTER — Encounter (INDEPENDENT_AMBULATORY_CARE_PROVIDER_SITE_OTHER): Payer: 59 | Admitting: Ophthalmology

## 2023-06-30 ENCOUNTER — Encounter (INDEPENDENT_AMBULATORY_CARE_PROVIDER_SITE_OTHER): Admitting: Ophthalmology

## 2023-07-03 ENCOUNTER — Encounter (INDEPENDENT_AMBULATORY_CARE_PROVIDER_SITE_OTHER): Admitting: Ophthalmology

## 2023-07-03 DIAGNOSIS — Z7984 Long term (current) use of oral hypoglycemic drugs: Secondary | ICD-10-CM

## 2023-07-03 DIAGNOSIS — Z794 Long term (current) use of insulin: Secondary | ICD-10-CM

## 2023-07-03 DIAGNOSIS — I10 Essential (primary) hypertension: Secondary | ICD-10-CM

## 2023-07-03 DIAGNOSIS — H25813 Combined forms of age-related cataract, bilateral: Secondary | ICD-10-CM

## 2023-07-03 DIAGNOSIS — E113413 Type 2 diabetes mellitus with severe nonproliferative diabetic retinopathy with macular edema, bilateral: Secondary | ICD-10-CM

## 2023-07-03 DIAGNOSIS — Z7985 Long-term (current) use of injectable non-insulin antidiabetic drugs: Secondary | ICD-10-CM

## 2023-07-03 DIAGNOSIS — H35033 Hypertensive retinopathy, bilateral: Secondary | ICD-10-CM

## 2023-07-06 NOTE — Progress Notes (Signed)
 [facility] - Clinic Note  07/10/2023     CHIEF COMPLAINT Patient presents for Retina Follow Up  HISTORY OF PRESENT ILLNESS: Philip Wilson is a 53 y.o. male who presents to the clinic today for:   HPI     Retina Follow Up   Patient presents with  Diabetic Retinopathy.  In both eyes.  This started 20 weeks ago.  Duration of 20 weeks.  Since onset it is stable.  I, the attending physician,  performed the HPI with the patient and updated documentation appropriately.        Comments   20 week retina follow up NPDR OU IVE OD  pt is reporting no vision changes  noticed he denies any flashes or floaters his last reading A1C 5 range 65 this am       Last edited by Rennis Chris, MD on 07/10/2023 11:57 PM.    Patient states he is not having any problems with his vision  Referring physician: Blane Ohara, MD [address] [place],  [place] [address]  HISTORICAL INFORMATION:   Selected notes from the MEDICAL RECORD NUMBER Referred by Dr. Tomma Lightning for diabetic retinopathy OU   CURRENT MEDICATIONS: No current outpatient medications on file. (Ophthalmic Drugs)   Current Facility-Administered Medications (Ophthalmic Drugs)  Medication Route   aflibercept (EYLEA) SOLN 2 mg Intravitreal   Current Outpatient Medications (Other)  Medication Sig   AgaMatrix Ultra-Thin Lancets MISC Check blood sugar fasting and once 2 hours after meal   amLODipine (NORVASC) 5 MG tablet Take 5 mg by mouth daily.   aspirin 81 MG EC tablet Take by mouth.   atorvastatin (LIPITOR) 40 MG tablet TAKE 1 TABLET(40 MG) BY MOUTH DAILY   BD PEN NEEDLE NANO 2ND GEN 32G X 4 MM MISC as directed   Blood Glucose Monitoring Suppl (FIFTY50 GLUCOSE METER 2.0) w/Device KIT 1 each by Other route 4 times daily. Use as instructed one touch ultra   glucose blood (ACCU-CHEK GUIDE) test strip Check blood sugar 3 times daily   insulin aspart (NOVOLOG FLEXPEN) 100 UNIT/ML FlexPen Inject 20 Units  into the skin 3 (three) times daily with meals.   Insulin Glargine (BASAGLAR KWIKPEN) 100 UNIT/ML Inject into the skin.   labetalol (NORMODYNE) 200 MG tablet Take 200 mg by mouth 2 (two) times daily.   mycophenolate (MYFORTIC) 180 MG EC tablet Take 360 mg by mouth 2 (two) times daily.   OZEMPIC, 2 MG/DOSE, 8 MG/3ML SOPN INJECT 2 MGS UNDER THE SKIN ONCE WEEKLY   pantoprazole (PROTONIX) 40 MG tablet TAKE 1 TABLET(40 MG) BY MOUTH DAILY   predniSONE (DELTASONE) 5 MG tablet Take 1 tablet (5 mg total) by mouth daily with breakfast.   sildenafil (REVATIO) 20 MG tablet Take 3 to 5 tablets 1 hour prior to intercourse.  No more than 24 hours.   tacrolimus (PROGRAF) 1 MG capsule Take 1 mg by mouth 2 (two) times daily.   TRESIBA FLEXTOUCH 200 UNIT/ML FlexTouch Pen ADMINISTER 48 UNITS UNDER THE SKIN DAILY   valGANciclovir (VALCYTE) 450 MG tablet Indications: treatment to prevent cytomegalovirus disease   VASCEPA 1 g capsule TAKE 2 CAPSULES(2 GRAMS) BY MOUTH TWICE DAILY   Current Facility-Administered Medications (Other)  Medication Route   Bevacizumab (AVASTIN) SOLN 1.25 mg Intravitreal   REVIEW OF SYSTEMS: ROS   Positive for: Neurological, Genitourinary, Endocrine, Eyes Negative for: Constitutional, Gastrointestinal, Skin, Musculoskeletal, HENT, Cardiovascular, Respiratory, Psychiatric, Allergic/Imm, Heme/Lymph Last edited by Etheleen Mayhew, COT  on 07/10/2023  9:41 AM.     ALLERGIES No Known Allergies  PAST MEDICAL HISTORY Past Medical History:  Diagnosis Date   Cataract    OU   Chronic kidney disease 05/04/2018   Chronic kidney disease, stage 3a (HCC) 05/29/2022   Diabetes 1.5, managed as type 2 (HCC)    Diabetic retinopathy (HCC)    NPDR OU   Hypertension    Hypertensive retinopathy    OU   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  Added automatically from request for surgery [id number]     Past Surgical History:   Procedure Laterality Date   AV FISTULA PLACEMENT Left 09/28/2018   Procedure: Creation of Left arm Radiocephalic ARTERIOVENOUS (AV) FISTULA;  Surgeon: Cephus Shelling, MD;  Location: [facility] OR;  Service: Vascular;  Laterality: Left;   KIDNEY TRANSPLANT  04/02/2019   MULTIPLE TOOTH EXTRACTIONS     FAMILY HISTORY Family History  Problem Relation Age of Onset   Heart failure Mother    SOCIAL HISTORY Social History   Tobacco Use   Smoking status: Former   Smokeless tobacco: Never   Tobacco comments:    quit 20 years ago  Vaping Use   Vaping status: Never Used  Substance Use Topics   Alcohol use: Not Currently   Drug use: Never       OPHTHALMIC EXAM:  Base Eye Exam     Visual Acuity (Snellen - Linear)       Right Left   Dist cc 20/25 -2 20/25 -1   Dist ph cc NI NI         Tonometry (Tonopen, 9:46 AM)       Right Left   Pressure 15 16         Pupils       Pupils Dark Light Shape React APD   Right PERRL 3 2 Round Brisk None   Left PERRL 3 2 Round Brisk None         Visual Fields       Left Right    Full Full         Extraocular Movement       Right Left    Full, Ortho Full, Ortho         Neuro/Psych     Oriented x3: Yes   Mood/Affect: Normal         Dilation     Both eyes: 2.5% Phenylephrine @ 9:46 AM           Slit Lamp and Fundus Exam     Slit Lamp Exam       Right Left   Lids/Lashes Dermatochalasis - upper lid, mild Meibomian gland dysfunction Dermatochalasis - upper lid, mild Meibomian gland dysfunction   Conjunctiva/Sclera White and quiet White and quiet   Cornea trace PEE trace PEE   Anterior Chamber Deep and quiet, no cell or flare Deep and quiet, no cell or flare   Iris Round and dilated to 7mm Round and dilated to 7mm   Lens 1+ Nuclear sclerosis, 2+ Cortical cataract 1+ Nuclear sclerosis, 2+ Cortical cataract   Anterior Vitreous Mild Vitreous syneresis, Posterior vitreous detachment, Weiss ring Mild Vitreous  syneresis, old white VH inferiorly         Fundus Exam       Right Left   Disc Pink and Sharp Pink and Sharp, Compact   C/D Ratio 0.2 0.3  Macula good foveal reflex, mild ERM greatest superior to fovea, focal cystic changes ST macula -- slightly improved, no heme Flat, good foveal reflex, mild ERM, No heme or edema   Vessels attenuated, Tortuous attenuated, Tortuous   Periphery Attached, No RT/RD, no heme Attached, rare MA           IMAGING AND PROCEDURES  Imaging and Procedures for @TODAY @  OCT, Retina - OU - Both Eyes       Right Eye Quality was good. Central Foveal Thickness: 252. Progression has improved. Findings include no IRF, no SRF, abnormal foveal contour, retinal drusen , intraretinal hyper-reflective material, epiretinal membrane, macular pucker (Mild Interval improvement in cystic changes and edema ST macula; trace ERM).   Left Eye Quality was good. Central Foveal Thickness: 250. Progression has been stable. Findings include normal foveal contour, no IRF, no SRF, intraretinal hyper-reflective material, epiretinal membrane (Trace cystic changes stably improved).   Notes *Images captured and stored on drive  Diagnosis / Impression:  OD: Mild Interval improvement in cystic changes and edema ST macula; trace ERM OS: NFP, no IRF/SRF -- no DME  Clinical management:  See below  Abbreviations: NFP - Normal foveal profile. CME - cystoid macular edema. PED - pigment epithelial detachment. IRF - intraretinal fluid. SRF - subretinal fluid. EZ - ellipsoid zone. ERM - epiretinal membrane. ORA - outer retinal atrophy. ORT - outer retinal tubulation. SRHM - subretinal hyper-reflective material            ASSESSMENT/PLAN:    ICD-10-CM   1. 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 Current use of insulin (HCC)  Z79.4     3. Long-term  (current) use of injectable non-insulin antidiabetic drugs  Z79.85     4. Long term (current) use of oral hypoglycemic drugs  Z79.84     5. Essential hypertension  I10     6. Hypertensive retinopathy of both eyes  H35.033     7. Combined forms of age-related cataract of both eyes  H25.813      1-4. Severe Non-proliferative diabetic retinopathy, both eyes  - delayed f/u 4+ months instead of 13-14 weeks  - delayed f/u -- 13 wks instead of 12  - delayed to f/u from 10-11 weeks to 18 weeks (10.03.23 - 02.06.24)  - last A1c was 5.6 on 12.30.24; 6.5 on 09/30/22, 7.2 on 02.22.24; 7.0 on 11.07.23; 7.4 on 06.13.23  - initial exam showed massive central DME OU and scattered IRH and IRMA - FA 6.5.19 with patches of capillary nonperfusion; extensive Mas with late leakage OU; no frank NV  - FA 01.02.20, shows improvement in late leaking microaneurysms OU - S/P IVA OD #1 (06.07.19), #2 (07.08.19), #3 (07.08.19) --  IVA resistance - S/P IVE OD #1 (09.03.19), #2 (10.03.19), #3 (11.04.19), #4 (12.02.19), #5 (01.02.20), #6 (02.07.20), #7 (03.16.20), #8 (05.20.20), #9 (06.22.20), #10 (07.27.20), #11(09.09.10), #12 (10.28.20), #13 (11.25.20), #14 (04.06.21) - sample, #15 (05.04.21) - sample, #16 (06.18.21), #17 (07.30.21), #18 (09.10.21), #19 (11.16.21), #20 (1.7.22), #21 (4.12.22), #22 (06.07.22), #23 (8.1.22), #24 (10.11.22), #25 (12.6.22), #26 (1.31.23), #27 (04.25.23), #28 (07.07.23), #29 (10.03.23), #30 (02.06.24), #31 (05.07.24), #32 (08.06.24), #33 (11.15.24) ==========================================================  - S/P IVA OS #1 (06.05.19), #2 (07.08.19), #3 (07.08.19), #4 (09.03.19) - S/P IVE OS #1 (10.03.19), #2 (11.04.19), #3 (12.02.19), #4 (01.02.20), #5 (02.07.20), #6 (03.16.20), #7 (05.20.20), #8 (06.22.20),#9 (07.27.20), #10 (  09.09.20), #11 (10.28.20), #12 (11.25.20) -- IVA resistance =========================================================== - OCT shows OD: Mild Interval improvement in  cystic changes and edema ST macula; trace ERM at 4+ months; OS: NFP, no IRF/SRF -- no DME  - BCVA: OD 20/25 - stable, OS 20/25 from 20/20  - recommend holding IVE OU today  - pt in agreement - Eylea paperwork and benefits investigation started on 08.05.19 -- approved for 2024  - f/u in 4 months -- DFE/OCT/possible injection  5,6. Hypertensive retinopathy OU  - discussed importance of tight BP control  - had some Bps in hypertensive emergency range (200s / 110s)  - BP improved post kidney transplant  - discussed likely contribution to DME  - monitor   - BP management per nephrology, PCP and cardiology            7. 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  - discussed diagnosis and progression  - not yet visually significant  - monitor for now  Ophthalmic Meds Ordered this visit:  No orders of the defined types were placed in this encounter.    Return in about 4 months (around 11/09/2023) for NPDR OU, Dilated Exam, OCT, Possible Injxn.  There are no Patient Instructions on file for this visit.  This document serves as a record of services personally performed by Karie Chimera, MD, PhD. It was created on their behalf by Glee Arvin. Manson Passey, OA an ophthalmic technician. The creation of this record is the provider's dictation and/or activities during the visit.    Electronically signed by: Glee Arvin. Manson Passey, OA 07/11/23 12:23 AM  Karie Chimera, M.D., Ph.D. Diseases & Surgery of the Retina and Vitreous [facility] 07/10/2023   I have reviewed the above documentation for accuracy and completeness, and I agree with the above. Karie Chimera, M.D., Ph.D. 07/11/23 12:24 AM   Abbreviations: M myopia (nearsighted); A astigmatism; H hyperopia (farsighted); P presbyopia; Mrx spectacle prescription;  CTL contact lenses; OD right eye; OS left eye; OU both eyes  XT  exotropia; ET esotropia; PEK punctate epithelial keratitis; PEE punctate epithelial erosions; DES dry eye syndrome; MGD meibomian gland dysfunction; ATs artificial tears; PFAT's preservative free artificial tears; NSC nuclear sclerotic cataract; PSC posterior subcapsular cataract; ERM epi-retinal membrane; PVD posterior vitreous detachment; RD retinal detachment; DM diabetes mellitus; DR diabetic retinopathy; NPDR non-proliferative diabetic retinopathy; PDR proliferative diabetic retinopathy; CSME clinically significant macular edema; DME diabetic macular edema; dbh dot blot hemorrhages; CWS cotton wool spot; POAG primary open angle glaucoma; C/D cup-to-disc ratio; HVF humphrey visual field; GVF goldmann visual field; OCT optical coherence tomography; IOP intraocular pressure; BRVO Branch retinal vein occlusion; CRVO central retinal vein occlusion; CRAO central retinal artery occlusion; BRAO branch retinal artery occlusion; RT retinal tear; SB scleral buckle; PPV pars plana vitrectomy; VH Vitreous hemorrhage; PRP panretinal laser photocoagulation; IVK intravitreal kenalog; VMT vitreomacular traction; MH Macular hole;  NVD neovascularization of the disc; NVE neovascularization elsewhere; AREDS age related eye disease study; ARMD age related macular degeneration; POAG primary open angle glaucoma; EBMD epithelial/anterior basement membrane dystrophy; ACIOL anterior chamber intraocular lens; IOL intraocular lens; PCIOL posterior chamber intraocular lens; Phaco/IOL phacoemulsification with intraocular lens placement; PRK photorefractive keratectomy; LASIK laser assisted in situ keratomileusis; HTN hypertension; DM diabetes mellitus; COPD chronic obstructive pulmonary disease

## 2023-07-09 LAB — LAB REPORT - SCANNED
Creatinine, POC: 252.9 mg/dL
EGFR: 66

## 2023-07-10 ENCOUNTER — Ambulatory Visit (INDEPENDENT_AMBULATORY_CARE_PROVIDER_SITE_OTHER): Admitting: Ophthalmology

## 2023-07-10 ENCOUNTER — Encounter (INDEPENDENT_AMBULATORY_CARE_PROVIDER_SITE_OTHER): Payer: Self-pay | Admitting: Ophthalmology

## 2023-07-10 DIAGNOSIS — Z7985 Long-term (current) use of injectable non-insulin antidiabetic drugs: Secondary | ICD-10-CM

## 2023-07-10 DIAGNOSIS — H25813 Combined forms of age-related cataract, bilateral: Secondary | ICD-10-CM

## 2023-07-10 DIAGNOSIS — Z794 Long term (current) use of insulin: Secondary | ICD-10-CM

## 2023-07-10 DIAGNOSIS — I10 Essential (primary) hypertension: Secondary | ICD-10-CM

## 2023-07-10 DIAGNOSIS — Z7984 Long term (current) use of oral hypoglycemic drugs: Secondary | ICD-10-CM | POA: Diagnosis not present

## 2023-07-10 DIAGNOSIS — E113413 Type 2 diabetes mellitus with severe nonproliferative diabetic retinopathy with macular edema, bilateral: Secondary | ICD-10-CM | POA: Diagnosis not present

## 2023-07-10 DIAGNOSIS — H35033 Hypertensive retinopathy, bilateral: Secondary | ICD-10-CM

## 2023-07-31 ENCOUNTER — Other Ambulatory Visit: Payer: Self-pay | Admitting: Family Medicine

## 2023-07-31 ENCOUNTER — Encounter: Payer: Self-pay | Admitting: Family Medicine

## 2023-07-31 DIAGNOSIS — E1121 Type 2 diabetes mellitus with diabetic nephropathy: Secondary | ICD-10-CM

## 2023-07-31 DIAGNOSIS — E088 Diabetes mellitus due to underlying condition with unspecified complications: Secondary | ICD-10-CM

## 2023-07-31 MED ORDER — SEMAGLUTIDE (1 MG/DOSE) 4 MG/3ML ~~LOC~~ SOPN: 1.0000 mg | PEN_INJECTOR | SUBCUTANEOUS | 2 refills | Status: DC

## 2023-08-23 ENCOUNTER — Other Ambulatory Visit: Payer: Self-pay | Admitting: Family Medicine

## 2023-08-23 DIAGNOSIS — E1121 Type 2 diabetes mellitus with diabetic nephropathy: Secondary | ICD-10-CM

## 2023-08-26 ENCOUNTER — Other Ambulatory Visit: Payer: Self-pay | Admitting: Family Medicine

## 2023-09-05 ENCOUNTER — Other Ambulatory Visit: Payer: Self-pay | Admitting: Family Medicine

## 2023-09-09 ENCOUNTER — Other Ambulatory Visit: Payer: Self-pay | Admitting: Family Medicine

## 2023-09-09 DIAGNOSIS — K2100 Gastro-esophageal reflux disease with esophagitis, without bleeding: Secondary | ICD-10-CM

## 2023-09-20 ENCOUNTER — Ambulatory Visit: Admitting: Family Medicine

## 2023-10-26 NOTE — Progress Notes (Shared)
 [facility] - Clinic Note  11/06/2023     CHIEF COMPLAINT Patient presents for No chief complaint on file.  HISTORY OF PRESENT ILLNESS: Philip Wilson is a 53 y.o. male who presents to the clinic today for:    Patient states he is not having any problems with his vision  Referring physician: Sherre Clapper, MD [address] [place],  [place] [address]  HISTORICAL INFORMATION:   Selected notes from the MEDICAL RECORD NUMBER Referred by Dr. Dasie Sow for diabetic retinopathy OU   CURRENT MEDICATIONS: No current outpatient medications on file. (Ophthalmic Drugs)   Current Facility-Administered Medications (Ophthalmic Drugs)  Medication Route   aflibercept  (EYLEA ) SOLN 2 mg Intravitreal   Current Outpatient Medications (Other)  Medication Sig   AgaMatrix Ultra-Thin Lancets MISC Check blood sugar fasting and once 2 hours after meal   amLODipine (NORVASC) 5 MG tablet Take 5 mg by mouth daily.   aspirin 81 MG EC tablet Take by mouth.   atorvastatin  (LIPITOR) 40 MG tablet TAKE 1 TABLET(40 MG) BY MOUTH DAILY   BD PEN NEEDLE NANO 2ND GEN 32G X 4 MM MISC as directed   Blood Glucose Monitoring Suppl (FIFTY50 GLUCOSE METER 2.0) w/Device KIT 1 each by Other route 4 times daily. Use as instructed one touch ultra   glucose blood (ACCU-CHEK GUIDE) test strip Check blood sugar 3 times daily   icosapent  Ethyl (VASCEPA ) 1 g capsule TAKE 2 CAPSULES(2 GRAMS) BY MOUTH TWICE DAILY   insulin  aspart (NOVOLOG  FLEXPEN) 100 UNIT/ML FlexPen Inject 20 Units into the skin 3 (three) times daily with meals.   Insulin  Glargine (BASAGLAR KWIKPEN) 100 UNIT/ML Inject into the skin.   labetalol (NORMODYNE) 200 MG tablet Take 200 mg by mouth 2 (two) times daily.   mycophenolate (MYFORTIC) 180 MG EC tablet Take 360 mg by mouth 2 (two) times daily.   pantoprazole (PROTONIX) 40 MG tablet TAKE 1 TABLET(40 MG) BY MOUTH DAILY   predniSONE  (DELTASONE ) 5 MG tablet Take 1 tablet (5 mg  total) by mouth daily with breakfast.   Semaglutide , 1 MG/DOSE, 4 MG/3ML SOPN Inject 1 mg as directed once a week.   sildenafil  (REVATIO ) 20 MG tablet Take 3 to 5 tablets 1 hour prior to intercourse.  No more than 24 hours.   tacrolimus  (PROGRAF ) 1 MG capsule Take 1 mg by mouth 2 (two) times daily.   TRESIBA  FLEXTOUCH 200 UNIT/ML FlexTouch Pen ADMINISTER 48 UNITS UNDER THE SKIN DAILY   valGANciclovir (VALCYTE) 450 MG tablet Indications: treatment to prevent cytomegalovirus disease   Current Facility-Administered Medications (Other)  Medication Route   Bevacizumab  (AVASTIN ) SOLN 1.25 mg Intravitreal   REVIEW OF SYSTEMS:   ALLERGIES No Known Allergies  PAST MEDICAL HISTORY Past Medical History:  Diagnosis Date   Cataract    OU   Chronic kidney disease 05/04/2018   Chronic kidney disease, stage 3a (HCC) 05/29/2022   Diabetes 1.5, managed as type 2 (HCC)    Diabetic retinopathy (HCC)    NPDR OU   Hypertension    Hypertensive retinopathy    OU   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  Added automatically from request for surgery [id number]     Past Surgical History:  Procedure Laterality Date   AV FISTULA PLACEMENT Left 09/28/2018   Procedure: Creation of Left arm Radiocephalic ARTERIOVENOUS (AV) FISTULA;  Surgeon: Gretta Lonni PARAS, MD;  Location: [facility] OR;  Service: Vascular;  Laterality: Left;   KIDNEY TRANSPLANT  04/02/2019   MULTIPLE TOOTH EXTRACTIONS     FAMILY HISTORY Family History  Problem Relation Age of Onset   Heart failure Mother    SOCIAL HISTORY Social History   Tobacco Use   Smoking status: Former   Smokeless tobacco: Never   Tobacco comments:    quit 20 years ago  Vaping Use   Vaping status: Never Used  Substance Use Topics   Alcohol use: Not Currently   Drug use: Never       OPHTHALMIC EXAM:  Not recorded    IMAGING AND PROCEDURES  Imaging and Procedures for @TODAY @           ASSESSMENT/PLAN:    ICD-10-CM   1. 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 Current use of insulin  (HCC)  Z79.4     3. Long-term (current) use of injectable non-insulin  antidiabetic drugs  Z79.85     4. Long term (current) use of oral hypoglycemic drugs  Z79.84     5. Essential hypertension  I10     6. Hypertensive retinopathy of both eyes  H35.033     7. Combined forms of age-related cataract of both eyes  H25.813       1-4. Severe Non-proliferative diabetic retinopathy, both eyes  - delayed f/u 4+ months instead of 13-14 weeks  - delayed f/u -- 13 wks instead of 12  - delayed to f/u from 10-11 weeks to 18 weeks (10.03.23 - 02.06.24)  - last A1c was 5.6 on 12.30.24; 6.5 on 09/30/22, 7.2 on 02.22.24; 7.0 on 11.07.23; 7.4 on 06.13.23  - initial exam showed massive central DME OU and scattered IRH and IRMA - FA 6.5.19 with patches of capillary nonperfusion; extensive Mas with late leakage OU; no frank NV  - FA 01.02.20, shows improvement in late leaking microaneurysms OU - S/P IVA OD #1 (06.07.19), #2 (07.08.19), #3 (07.08.19) --  IVA resistance - S/P IVE OD #1 (09.03.19), #2 (10.03.19), #3 (11.04.19), #4 (12.02.19), #5 (01.02.20), #6 (02.07.20), #7 (03.16.20), #8 (05.20.20), #9 (06.22.20), #10 (07.27.20), #11(09.09.10), #12 (10.28.20), #13 (11.25.20), #14 (04.06.21) - sample, #15 (05.04.21) - sample, #16 (06.18.21), #17 (07.30.21), #18 (09.10.21), #19 (11.16.21), #20 (1.7.22), #21 (4.12.22), #22 (06.07.22), #23 (8.1.22), #24 (10.11.22), #25 (12.6.22), #26 (1.31.23), #27 (04.25.23), #28 (07.07.23), #29 (10.03.23), #30 (02.06.24), #31 (05.07.24), #32 (08.06.24), #33 (11.15.24) ==========================================================  - S/P IVA OS #1 (06.05.19), #2 (07.08.19), #3 (07.08.19), #4 (09.03.19) - S/P IVE OS #1 (10.03.19), #2 (11.04.19), #3 (12.02.19), #4 (01.02.20), #5  (02.07.20), #6 (03.16.20), #7 (05.20.20), #8 (06.22.20),#9 (07.27.20), #10 (09.09.20), #11 (10.28.20), #12 (11.25.20) -- IVA resistance =========================================================== - OCT shows OD: Mild Interval improvement in cystic changes and edema ST macula; trace ERM at 4+ months; OS: NFP, no IRF/SRF -- no DME  - BCVA: OD 20/25 - stable, OS 20/25 from 20/20  - recommend holding IVE OU today  - pt in agreement - Eylea  paperwork and benefits investigation started on 08.05.19 -- approved for 2024  - f/u in 4 months -- DFE/OCT/possible injection  5,6. Hypertensive retinopathy OU  - discussed importance of tight BP control  - had some Bps in hypertensive emergency range (200s / 110s)  - BP improved post kidney transplant  - discussed likely contribution to DME  - monitor   - BP management per nephrology, PCP and cardiology  7. 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  - discussed diagnosis and progression  - not yet visually significant  - monitor for now  Ophthalmic Meds Ordered this visit:  No orders of the defined types were placed in this encounter.    No follow-ups on file.  There are no Patient Instructions on file for this visit.  This document serves as a record of services personally performed by Redell JUDITHANN Hans, MD, PhD. It was created on their behalf by Avelina Pereyra, COA an ophthalmic technician. The creation of this record is the provider's dictation and/or activities during the visit.   Electronically signed by: Avelina GORMAN Pereyra, COT  10/26/23  2:07 PM    Redell JUDITHANN Hans, M.D., Ph.D. Diseases & Surgery of the Retina and Vitreous [facility]      Abbreviations: M myopia (nearsighted); A astigmatism; H hyperopia (farsighted); P presbyopia; Mrx spectacle prescription;  CTL contact lenses; OD right eye; OS left eye; OU  both eyes  XT exotropia; ET esotropia; PEK punctate epithelial keratitis; PEE punctate epithelial erosions; DES dry eye syndrome; MGD meibomian gland dysfunction; ATs artificial tears; PFAT's preservative free artificial tears; NSC nuclear sclerotic cataract; PSC posterior subcapsular cataract; ERM epi-retinal membrane; PVD posterior vitreous detachment; RD retinal detachment; DM diabetes mellitus; DR diabetic retinopathy; NPDR non-proliferative diabetic retinopathy; PDR proliferative diabetic retinopathy; CSME clinically significant macular edema; DME diabetic macular edema; dbh dot blot hemorrhages; CWS cotton wool spot; POAG primary open angle glaucoma; C/D cup-to-disc ratio; HVF humphrey visual field; GVF goldmann visual field; OCT optical coherence tomography; IOP intraocular pressure; BRVO Branch retinal vein occlusion; CRVO central retinal vein occlusion; CRAO central retinal artery occlusion; BRAO branch retinal artery occlusion; RT retinal tear; SB scleral buckle; PPV pars plana vitrectomy; VH Vitreous hemorrhage; PRP panretinal laser photocoagulation; IVK intravitreal kenalog; VMT vitreomacular traction; MH Macular hole;  NVD neovascularization of the disc; NVE neovascularization elsewhere; AREDS age related eye disease study; ARMD age related macular degeneration; POAG primary open angle glaucoma; EBMD epithelial/anterior basement membrane dystrophy; ACIOL anterior chamber intraocular lens; IOL intraocular lens; PCIOL posterior chamber intraocular lens; Phaco/IOL phacoemulsification with intraocular lens placement; PRK photorefractive keratectomy; LASIK laser assisted in situ keratomileusis; HTN hypertension; DM diabetes mellitus; COPD chronic obstructive pulmonary disease

## 2023-11-06 ENCOUNTER — Encounter (INDEPENDENT_AMBULATORY_CARE_PROVIDER_SITE_OTHER): Admitting: Ophthalmology

## 2023-11-06 DIAGNOSIS — E113413 Type 2 diabetes mellitus with severe nonproliferative diabetic retinopathy with macular edema, bilateral: Secondary | ICD-10-CM

## 2023-11-06 DIAGNOSIS — Z7985 Long-term (current) use of injectable non-insulin antidiabetic drugs: Secondary | ICD-10-CM

## 2023-11-06 DIAGNOSIS — Z794 Long term (current) use of insulin: Secondary | ICD-10-CM

## 2023-11-06 DIAGNOSIS — H25813 Combined forms of age-related cataract, bilateral: Secondary | ICD-10-CM

## 2023-11-06 DIAGNOSIS — H35033 Hypertensive retinopathy, bilateral: Secondary | ICD-10-CM

## 2023-11-06 DIAGNOSIS — I10 Essential (primary) hypertension: Secondary | ICD-10-CM

## 2023-11-06 DIAGNOSIS — Z7984 Long term (current) use of oral hypoglycemic drugs: Secondary | ICD-10-CM

## 2023-11-08 ENCOUNTER — Encounter: Payer: Self-pay | Admitting: Family Medicine

## 2023-11-08 ENCOUNTER — Ambulatory Visit: Admitting: Family Medicine

## 2023-11-08 ENCOUNTER — Other Ambulatory Visit: Payer: Self-pay | Admitting: Family Medicine

## 2023-11-08 VITALS — BP 118/68 | HR 78 | Temp 98.2°F | Ht 70.0 in | Wt 192.0 lb

## 2023-11-08 DIAGNOSIS — E1121 Type 2 diabetes mellitus with diabetic nephropathy: Secondary | ICD-10-CM | POA: Diagnosis not present

## 2023-11-08 DIAGNOSIS — I129 Hypertensive chronic kidney disease with stage 1 through stage 4 chronic kidney disease, or unspecified chronic kidney disease: Secondary | ICD-10-CM

## 2023-11-08 DIAGNOSIS — K219 Gastro-esophageal reflux disease without esophagitis: Secondary | ICD-10-CM

## 2023-11-08 DIAGNOSIS — D849 Immunodeficiency, unspecified: Secondary | ICD-10-CM

## 2023-11-08 DIAGNOSIS — E782 Mixed hyperlipidemia: Secondary | ICD-10-CM | POA: Diagnosis not present

## 2023-11-08 DIAGNOSIS — N521 Erectile dysfunction due to diseases classified elsewhere: Secondary | ICD-10-CM

## 2023-11-08 DIAGNOSIS — N1831 Chronic kidney disease, stage 3a: Secondary | ICD-10-CM

## 2023-11-08 DIAGNOSIS — Z1211 Encounter for screening for malignant neoplasm of colon: Secondary | ICD-10-CM

## 2023-11-08 DIAGNOSIS — H3530 Unspecified macular degeneration: Secondary | ICD-10-CM

## 2023-11-08 MED ORDER — ATORVASTATIN CALCIUM 40 MG PO TABS: ORAL_TABLET | ORAL | Status: DC

## 2023-11-08 MED ORDER — SEMAGLUTIDE (1 MG/DOSE) 4 MG/3ML ~~LOC~~ SOPN: 1.0000 mg | PEN_INJECTOR | SUBCUTANEOUS | 0 refills | Status: DC

## 2023-11-08 NOTE — Patient Instructions (Signed)
 VISIT SUMMARY:  Today, we reviewed your diabetes and hypertension management, discussed your recent weight changes, and adjusted your medications accordingly. We also addressed your kidney transplant care, acid reflux, and preventive health measures.  YOUR PLAN:  TYPE 1 DIABETES MELLITUS: Your diabetes is being managed with Tresiba  and Novolog . You stopped Ozempic  due to weight loss, which led to increased Novolog  use. Your A1c is 6.1%, indicating good control, but you have gained weight recently. -Restart Ozempic  at 0.25 mg, then increase to 0.5 mg, and eventually to 1 mg as tolerated. -Monitor your blood glucose levels and adjust insulin  dosage as needed. -We will order an A1c test to monitor your diabetes control.  STATUS POST KIDNEY TRANSPLANT: You have a history of kidney transplant and are on an immunosuppressive regimen. -Continue your current immunosuppressive medications: prednisone , tacrolimus , and Myfortic. -Continue taking atorvastatin  40 mg on Monday, Wednesday, and Friday.  HYPERTENSION: Your blood pressure is well-controlled on your current medications. -Continue taking labetalol 200 mg twice daily. -Continue taking losartan 25 mg once daily.  HYPERLIPIDEMIA: Your cholesterol is managed with atorvastatin , adjusted for your kidney function. -Continue taking atorvastatin  40 mg on Monday, Wednesday, and Friday.  GASTROESOPHAGEAL REFLUX DISEASE (GERD): You are managing your acid reflux with pantoprazole. -Continue taking pantoprazole 40 mg once daily.  MACULAR DEGENERATION: Your eye condition has improved, and you did not need an ARIA injection at your last visit. -Continue follow-up with your ophthalmologist for monitoring.  ERECTILE DYSFUNCTION: You are managing this condition with sildenafil  as needed. -Continue taking sildenafil  as needed.

## 2023-11-08 NOTE — Progress Notes (Signed)
 Subjective:  Patient ID: Philip Wilson, male    DOB: 1971-03-02  Age: 53 y.o. MRN: [id number]  Chief Complaint  Patient presents with   Medical Management of Chronic Issues     Discussed the use of AI scribe software for clinical note transcription with the patient, who gave verbal consent to proceed.  History of Present Illness   Philip Wilson is a 53 year old male with diabetes and hypertension who presents for medication management and follow-up.  Glycemic control and diabetes management - Discontinued Ozempic  one month ago due to significant weight loss, which was concerning to him and his family - Weight increased from 160 pounds to 192 pounds since stopping Ozempic  - Increased appetite and food intake after discontinuing Ozempic  - Currently using Tresiba  48 units daily, Novolog  10 units before supper. Using increased SSI. Lowered ozempic  to 1 mg weekly and then discontinued one month ago due to wt loss. Increased weight by 30 lbs. (dose unchanged) and increased Novolog  on a sliding scale - Occasional hypoglycemia with blood glucose readings as low as 59 mg/dL; highest readings up to 200 mg/dL (noted as anomalies) - Last hemoglobin A1c was 6.1% - History of diabetic macular degeneration; receives ARIA injections every four months, but did not require a shot at last ophthalmology visit  Hypertension management with CKD stage 3a. - Previously on amlodipine, now discontinued - Current regimen includes labetalol 200 mg twice daily and losartan 25 mg once daily - Blood pressure described as 'perfect' on current regimen  Renal transplant and immunosuppression - History of kidney transplant - Immunosuppressive regimen includes prednisone , tacrolimus , and Myfortic  Hyperlipidemia - Atorvastatin  40 mg three times a week due to renal considerations  GERD - Takes pantoprazole 40 mg daily for acid reflux - No abdominal pain or gastrointestinal complaints  reported  General health and physical activity - Increased physical activity recently - Feels well overall - No pain, earaches, sore throat, nasal congestion, fevers, or numbness in feet  Preventive health and cancer screening - Has not completed colon cancer screening - Did not receive Cologuard test as planned         11/08/2023    1:39 PM 01/19/2023    9:11 AM 05/26/2022    8:10 AM 03/23/2021   10:48 AM  Depression screen PHQ 2/9  Decreased Interest 0 0 0 0  Down, Depressed, Hopeless 1 0 0 0  PHQ - 2 Score 1 0 0 0  Altered sleeping 1 0 0   Tired, decreased energy 1 0 0   Change in appetite 0 0 0   Feeling bad or failure about yourself  0 0 0   Trouble concentrating 0 0 0   Moving slowly or fidgety/restless 0 0 0   Suicidal thoughts 0 0 0   PHQ-9 Score 3 0 0   Difficult doing work/chores Not difficult at all Not difficult at all Not difficult at all         11/08/2023    1:39 PM  Fall Risk   Falls in the past year? 0  Number falls in past yr: 0  Injury with Fall? 0  Risk for fall due to : No Fall Risks  Follow up Falls evaluation completed    Patient Care Team: Sherre Clapper, MD as PCP - General (Family Medicine) Jerrye Burnard HERO, NP as Nurse Practitioner (Nephrology)   Review of Systems  Constitutional:  Negative for chills, diaphoresis, fatigue and fever.  HENT:  Negative for congestion, ear pain and  sore throat.   Respiratory:  Negative for cough and shortness of breath.   Cardiovascular:  Negative for chest pain and leg swelling.  Gastrointestinal:  Negative for abdominal pain, constipation, diarrhea, nausea and vomiting.  Genitourinary:  Negative for dysuria and urgency.  Musculoskeletal:  Negative for arthralgias and myalgias.  Neurological:  Negative for dizziness and headaches.  Psychiatric/Behavioral:  Negative for dysphoric mood.     Current Outpatient Medications on File Prior to Visit  Medication Sig Dispense Refill   losartan (COZAAR) 25 MG tablet  Take 25 mg by mouth daily.     AgaMatrix Ultra-Thin Lancets MISC Check blood sugar fasting and once 2 hours after meal 100 each 5   aspirin 81 MG EC tablet Take by mouth.     BD PEN NEEDLE NANO 2ND GEN 32G X 4 MM MISC as directed 100 each 3   Blood Glucose Monitoring Suppl (FIFTY50 GLUCOSE METER 2.0) w/Device KIT 1 each by Other route 4 times daily. Use as instructed one touch ultra     glucose blood (ACCU-CHEK GUIDE) test strip Check blood sugar 3 times daily 100 strip 6   icosapent  Ethyl (VASCEPA ) 1 g capsule TAKE 2 CAPSULES(2 GRAMS) BY MOUTH TWICE DAILY 360 capsule 0   insulin  aspart (NOVOLOG  FLEXPEN) 100 UNIT/ML FlexPen Inject 20 Units into the skin 3 (three) times daily with meals. 54 mL 3   labetalol (NORMODYNE) 200 MG tablet Take 200 mg by mouth 2 (two) times daily.     mycophenolate (MYFORTIC) 180 MG EC tablet Take 360 mg by mouth 2 (two) times daily.     pantoprazole (PROTONIX) 40 MG tablet TAKE 1 TABLET(40 MG) BY MOUTH DAILY 90 tablet 1   predniSONE  (DELTASONE ) 5 MG tablet Take 1 tablet (5 mg total) by mouth daily with breakfast. 1 tablet 0   sildenafil  (REVATIO ) 20 MG tablet Take 3 to 5 tablets 1 hour prior to intercourse.  No more than 24 hours. 50 tablet 0   tacrolimus  (PROGRAF ) 1 MG capsule Take 1 mg by mouth 2 (two) times daily.     TRESIBA  FLEXTOUCH 200 UNIT/ML FlexTouch Pen ADMINISTER 48 UNITS UNDER THE SKIN DAILY 9 mL 1   valGANciclovir (VALCYTE) 450 MG tablet Indications: treatment to prevent cytomegalovirus disease     Current Facility-Administered Medications on File Prior to Visit  Medication Dose Route Frequency Provider Last Rate Last Admin   aflibercept  (EYLEA ) SOLN 2 mg  2 mg Intravitreal  Zamora, Brian, MD   2 mg at 05/13/18 2255   Bevacizumab  (AVASTIN ) SOLN 1.25 mg  1.25 mg Intravitreal  Zamora, Brian, MD   1.25 mg at 09/06/17 1312   Past Medical History:  Diagnosis Date   Cataract    OU   Chronic kidney disease 05/04/2018   Chronic kidney disease, stage 3a  (HCC) 05/29/2022   Diabetes 1.5, managed as type 2 (HCC)    Diabetic retinopathy (HCC)    NPDR OU   Hypertension    Hypertensive retinopathy    OU   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  Added automatically from request for surgery [id number]     Past Surgical History:  Procedure Laterality Date   AV FISTULA PLACEMENT Left 09/28/2018   Procedure: Creation of Left arm Radiocephalic ARTERIOVENOUS (AV) FISTULA;  Surgeon: Gretta Lonni PARAS, MD;  Location: [facility] OR;  Service: Vascular;  Laterality: Left;   KIDNEY TRANSPLANT  04/02/2019   MULTIPLE TOOTH EXTRACTIONS  Family History  Problem Relation Age of Onset   Heart failure Mother    Social History   Socioeconomic History   Marital status: Married    Spouse name: Not on file   Number of children: Not on file   Years of education: Not on file   Highest education level: Not on file  Occupational History   Occupation: sales  Tobacco Use   Smoking status: Former   Smokeless tobacco: Never   Tobacco comments:    quit 20 years ago  Vaping Use   Vaping status: Never Used  Substance and Sexual Activity   Alcohol use: Not Currently   Drug use: Never   Sexual activity: Yes    Partners: Female  Other Topics Concern   Not on file  Social History Narrative   Not on file   Social Drivers of Health   Financial Resource Strain: Low Risk  (05/26/2022)   Overall Financial Resource Strain (CARDIA)    Difficulty of Paying Living Expenses: Not hard at all  Food Insecurity: No Food Insecurity (06/07/2023)   Hunger Vital Sign    Worried About Running Out of Food in the Last Year: Never true    Ran Out of Food in the Last Year: Never true  Transportation Needs: No Transportation Needs (06/07/2023)   PRAPARE - Administrator, Civil Service (Medical): No    Lack of Transportation (Non-Medical): No  Physical Activity: Inactive (05/26/2022)   Exercise Vital Sign     Days of Exercise per Week: 0 days    Minutes of Exercise per Session: 0 min  Stress: No Stress Concern Present (05/26/2022)   [employer] of Occupational Health - Occupational Stress Questionnaire    Feeling of Stress : Not at all  Social Connections: Moderately Integrated (05/26/2022)   Social Connection and Isolation Panel    Frequency of Communication with Friends and Family: Twice a week    Frequency of Social Gatherings with Friends and Family: Twice a week    Attends Religious Services: 1 to 4 times per year    Active Member of [employer] or Organizations: No    Attends Engineer, structural: Never    Marital Status: Married    Objective:  BP 118/68   Pulse 78   Temp 98.2 F (36.8 C)   Ht 5' 10 (1.778 m)   Wt 192 lb (87.1 kg)   SpO2 98%   BMI 27.55 kg/m      11/08/2023    1:28 PM 06/07/2023    1:54 PM 01/19/2023    9:08 AM  BP/Weight  Systolic BP 118 120 132  Diastolic BP 68 68 70  Wt. (Lbs) 192 160 177  BMI 27.55 kg/m2 22.96 kg/m2 25.4 kg/m2    Physical Exam Vitals reviewed.  Constitutional:      Appearance: Normal appearance.  Neck:     Vascular: No carotid bruit.  Cardiovascular:     Rate and Rhythm: Normal rate and regular rhythm.     Pulses: Normal pulses.     Heart sounds: Normal heart sounds.  Pulmonary:     Effort: Pulmonary effort is normal.     Breath sounds: Normal breath sounds. No wheezing, rhonchi or rales.  Abdominal:     General: Bowel sounds are normal.     Palpations: Abdomen is soft.     Tenderness: There is no abdominal tenderness.  Neurological:     Mental Status: He is alert and oriented to person, place,  and time.  Psychiatric:        Mood and Affect: Mood normal.        Behavior: Behavior normal.      Diabetic foot exam was performed with the following findings:   No deformities, ulcerations, or other skin breakdown Intact posterior tibialis and dorsalis pedis pulses Decreased sensation      Lab Results   Component Value Date   WBC 12.1 (H) 11/08/2023   HGB 13.6 11/08/2023   HCT 41.7 11/08/2023   PLT 266 11/08/2023   GLUCOSE 46 (L) 11/08/2023   CHOL 159 11/08/2023   TRIG 125 11/08/2023   HDL 65 11/08/2023   LDLCALC 72 11/08/2023   ALT 36 11/08/2023   AST 37 11/08/2023   NA 140 11/08/2023   K 3.9 11/08/2023   CL 104 11/08/2023   CREATININE 1.47 (H) 11/08/2023   BUN 23 11/08/2023   CO2 20 11/08/2023   INR 1.0 05/29/2018   HGBA1C 6.5 (H) 11/08/2023      Assessment & Plan:  Hypertensive kidney disease, benign, stage 1-4 or unspecified chronic kidney disease Assessment & Plan: Well-controlled on your current medications. -Continue taking labetalol 200 mg twice daily. -Continue taking losartan 25 mg once daily.  Orders: -     CBC with Differential/Platelet  Diabetic glomerulopathy (HCC) Assessment & Plan: Managed with Tresiba  and Novolog . You stopped Ozempic  due to weight loss, which led to increased Novolog  use. Your A1c is 6.1%, indicating good control, but you have gained weight recently. -Restart Ozempic  at 0.25 mg, then increase to 0.5 mg, and eventually to 1 mg as tolerated. -Monitor your blood glucose levels and adjust insulin  dosage as needed. -We will order an A1c test to monitor your diabetes control.  Orders: -     Comprehensive metabolic panel with GFR -     Hemoglobin A1c -     Microalbumin / creatinine urine ratio -     Semaglutide  (1 MG/DOSE); Inject 1 mg as directed once a week.  Dispense: 9 mL; Refill: 0  Mixed hyperlipidemia Assessment & Plan: managed with atorvastatin , adjusted for your kidney function. -Continue taking atorvastatin  40 mg on Monday, Wednesday, and Friday.  Orders: -     Lipid panel -     Atorvastatin  Calcium ; M,W, F  GERD without esophagitis Assessment & Plan: -Continue taking pantoprazole 40 mg once daily.   Erectile dysfunction due to diseases classified elsewhere Assessment & Plan: -Continue taking sildenafil  as  needed.   Macular degeneration of both eyes, unspecified type Assessment & Plan: -Continue follow-up with your ophthalmologist for monitoring.   Screening for colon cancer -     Cologuard  Stage 3a chronic kidney disease (HCC) Assessment & Plan: Stable. No nonsteroid antiinflammatories, such as naproxen (aleve) or ibuprofen (advil.).    Immunosuppression [facility]) Assessment & Plan: Secondary to meds for renal transplant.      Meds ordered this encounter  Medications   atorvastatin  (LIPITOR) 40 MG tablet    Sig: M,W, F   Semaglutide , 1 MG/DOSE, 4 MG/3ML SOPN    Sig: Inject 1 mg as directed once a week.    Dispense:  9 mL    Refill:  0    Orders Placed This Encounter  Procedures   Lipid panel   CBC with Differential/Platelet   Comprehensive metabolic panel with GFR   Hemoglobin A1c   Cologuard   Microalbumin / creatinine urine ratio     Follow-up: Return in about 3 months (around 02/08/2024) for chronic follow  up.  LILLETTE Kato I Leal-Borjas,acting as a scribe for Abigail Free, MD.,have documented all relevant documentation on the behalf of Abigail Free, MD,as directed by  Abigail Free, MD while in the presence of Abigail Free, MD.    An After Visit Summary was printed and given to the patient.  I attest that I have reviewed this visit and agree with the plan scribed by my staff.   Abigail Free, MD [facility] [phone]

## 2023-11-09 ENCOUNTER — Ambulatory Visit: Payer: Self-pay | Admitting: Family Medicine

## 2023-11-09 LAB — CBC WITH DIFFERENTIAL/PLATELET
Basophils Absolute: 0.1 x10E3/uL (ref 0.0–0.2)
Basos: 0 %
EOS (ABSOLUTE): 0.2 x10E3/uL (ref 0.0–0.4)
Eos: 1 %
Hematocrit: 41.7 % (ref 37.5–51.0)
Hemoglobin: 13.6 g/dL (ref 13.0–17.7)
Immature Grans (Abs): 0 x10E3/uL (ref 0.0–0.1)
Immature Granulocytes: 0 %
Lymphocytes Absolute: 3.2 x10E3/uL — ABNORMAL HIGH (ref 0.7–3.1)
Lymphs: 27 %
MCH: 28.9 pg (ref 26.6–33.0)
MCHC: 32.6 g/dL (ref 31.5–35.7)
MCV: 89 fL (ref 79–97)
Monocytes Absolute: 0.9 x10E3/uL (ref 0.1–0.9)
Monocytes: 7 %
Neutrophils Absolute: 7.8 x10E3/uL — ABNORMAL HIGH (ref 1.4–7.0)
Neutrophils: 65 %
Platelets: 266 x10E3/uL (ref 150–450)
RBC: 4.7 x10E6/uL (ref 4.14–5.80)
RDW: 12.4 % (ref 11.6–15.4)
WBC: 12.1 x10E3/uL — ABNORMAL HIGH (ref 3.4–10.8)

## 2023-11-09 LAB — COMPREHENSIVE METABOLIC PANEL WITH GFR
ALT: 36 IU/L (ref 0–44)
AST: 37 IU/L (ref 0–40)
Albumin: 4.5 g/dL (ref 3.8–4.9)
Alkaline Phosphatase: 68 IU/L (ref 44–121)
BUN/Creatinine Ratio: 16 (ref 9–20)
BUN: 23 mg/dL (ref 6–24)
Bilirubin Total: 1.4 mg/dL — ABNORMAL HIGH (ref 0.0–1.2)
CO2: 20 mmol/L (ref 20–29)
Calcium: 9.9 mg/dL (ref 8.7–10.2)
Chloride: 104 mmol/L (ref 96–106)
Creatinine, Ser: 1.47 mg/dL — ABNORMAL HIGH (ref 0.76–1.27)
Globulin, Total: 3 g/dL (ref 1.5–4.5)
Glucose: 46 mg/dL — ABNORMAL LOW (ref 70–99)
Potassium: 3.9 mmol/L (ref 3.5–5.2)
Sodium: 140 mmol/L (ref 134–144)
Total Protein: 7.5 g/dL (ref 6.0–8.5)
eGFR: 56 mL/min/1.73 — ABNORMAL LOW (ref >59)

## 2023-11-09 LAB — HEMOGLOBIN A1C
Est. average glucose Bld gHb Est-mCnc: 140 mg/dL
Hgb A1c MFr Bld: 6.5 % — ABNORMAL HIGH (ref 4.8–5.6)

## 2023-11-09 LAB — MICROALBUMIN / CREATININE URINE RATIO
Creatinine, Urine: 136.6 mg/dL
Microalb/Creat Ratio: 12 mg/g{creat} (ref 0–29)
Microalbumin, Urine: 16.5 ug/mL

## 2023-11-09 LAB — LIPID PANEL
Chol/HDL Ratio: 2.4 ratio (ref 0.0–5.0)
Cholesterol, Total: 159 mg/dL (ref 100–199)
HDL: 65 mg/dL (ref >39)
LDL Chol Calc (NIH): 72 mg/dL (ref 0–99)
Triglycerides: 125 mg/dL (ref 0–149)
VLDL Cholesterol Cal: 22 mg/dL (ref 5–40)

## 2023-11-11 DIAGNOSIS — H3530 Unspecified macular degeneration: Secondary | ICD-10-CM | POA: Insufficient documentation

## 2023-11-11 DIAGNOSIS — N521 Erectile dysfunction due to diseases classified elsewhere: Secondary | ICD-10-CM | POA: Insufficient documentation

## 2023-11-11 NOTE — Assessment & Plan Note (Signed)
-  Continue follow-up with your ophthalmologist for monitoring.

## 2023-11-11 NOTE — Assessment & Plan Note (Signed)
 managed with atorvastatin , adjusted for your kidney function. -Continue taking atorvastatin  40 mg on Monday, Wednesday, and Friday.

## 2023-11-11 NOTE — Assessment & Plan Note (Signed)
Secondary to meds for renal transplant.

## 2023-11-11 NOTE — Assessment & Plan Note (Signed)
 Managed with Tresiba  and Novolog . You stopped Ozempic  due to weight loss, which led to increased Novolog  use. Your A1c is 6.1%, indicating good control, but you have gained weight recently. -Restart Ozempic  at 0.25 mg, then increase to 0.5 mg, and eventually to 1 mg as tolerated. -Monitor your blood glucose levels and adjust insulin  dosage as needed. -We will order an A1c test to monitor your diabetes control.

## 2023-11-11 NOTE — Assessment & Plan Note (Signed)
-  Continue taking pantoprazole 40 mg once daily.

## 2023-11-11 NOTE — Assessment & Plan Note (Signed)
 Stable. No nonsteroid antiinflammatories, such as naproxen (aleve) or ibuprofen (advil.).

## 2023-11-11 NOTE — Assessment & Plan Note (Signed)
 Well-controlled on your current medications. -Continue taking labetalol 200 mg twice daily. -Continue taking losartan 25 mg once daily.

## 2023-11-11 NOTE — Assessment & Plan Note (Signed)
-  Continue taking sildenafil  as needed.

## 2023-11-19 ENCOUNTER — Other Ambulatory Visit: Payer: Self-pay | Admitting: Family Medicine

## 2023-11-19 DIAGNOSIS — E1121 Type 2 diabetes mellitus with diabetic nephropathy: Secondary | ICD-10-CM

## 2023-11-30 ENCOUNTER — Other Ambulatory Visit: Payer: Self-pay | Admitting: Family Medicine

## 2023-11-30 DIAGNOSIS — E782 Mixed hyperlipidemia: Secondary | ICD-10-CM

## 2023-12-04 ENCOUNTER — Other Ambulatory Visit: Payer: Self-pay | Admitting: Family Medicine

## 2023-12-05 NOTE — Progress Notes (Shared)
 [facility] - Clinic Note  12/19/2023     CHIEF COMPLAINT Patient presents for No chief complaint on file.  HISTORY OF PRESENT ILLNESS: Philip Wilson is a 53 y.o. male who presents to the clinic today for:    Patient states he is not having any problems with his vision  Referring physician: Sherre Clapper, MD [address] [place],  [place] [address]  HISTORICAL INFORMATION:   Selected notes from the MEDICAL RECORD NUMBER Referred by Dr. Dasie Sow for diabetic retinopathy OU   CURRENT MEDICATIONS: No current outpatient medications on file. (Ophthalmic Drugs)   Current Facility-Administered Medications (Ophthalmic Drugs)  Medication Route   aflibercept  (EYLEA ) SOLN 2 mg Intravitreal   Current Outpatient Medications (Other)  Medication Sig   AgaMatrix Ultra-Thin Lancets MISC Check blood sugar fasting and once 2 hours after meal   aspirin 81 MG EC tablet Take by mouth.   atorvastatin  (LIPITOR) 40 MG tablet TAKE 1 TABLET(40 MG) BY MOUTH DAILY   BD PEN NEEDLE NANO 2ND GEN 32G X 4 MM MISC as directed   Blood Glucose Monitoring Suppl (FIFTY50 GLUCOSE METER 2.0) w/Device KIT 1 each by Other route 4 times daily. Use as instructed one touch ultra   glucose blood (ACCU-CHEK GUIDE) test strip Check blood sugar 3 times daily   insulin  aspart (NOVOLOG  FLEXPEN) 100 UNIT/ML FlexPen Inject 20 Units into the skin 3 (three) times daily with meals.   labetalol (NORMODYNE) 200 MG tablet Take 200 mg by mouth 2 (two) times daily.   losartan (COZAAR) 25 MG tablet Take 25 mg by mouth daily.   mycophenolate (MYFORTIC) 180 MG EC tablet Take 360 mg by mouth 2 (two) times daily.   pantoprazole (PROTONIX) 40 MG tablet TAKE 1 TABLET(40 MG) BY MOUTH DAILY   predniSONE  (DELTASONE ) 5 MG tablet Take 1 tablet (5 mg total) by mouth daily with breakfast.   Semaglutide , 1 MG/DOSE, 4 MG/3ML SOPN Inject 1 mg as directed once a week.   sildenafil  (REVATIO ) 20 MG tablet Take 3 to  5 tablets 1 hour prior to intercourse.  No more than 24 hours.   tacrolimus  (PROGRAF ) 1 MG capsule Take 1 mg by mouth 2 (two) times daily.   TRESIBA  FLEXTOUCH 200 UNIT/ML FlexTouch Pen ADMINISTER 48 UNITS UNDER THE SKIN DAILY   valGANciclovir (VALCYTE) 450 MG tablet Indications: treatment to prevent cytomegalovirus disease   VASCEPA  1 g capsule TAKE 2 CAPSULES(2 GRAMS) BY MOUTH TWICE DAILY   Current Facility-Administered Medications (Other)  Medication Route   Bevacizumab  (AVASTIN ) SOLN 1.25 mg Intravitreal   REVIEW OF SYSTEMS:   ALLERGIES No Known Allergies  PAST MEDICAL HISTORY Past Medical History:  Diagnosis Date   Cataract    OU   Chronic kidney disease 05/04/2018   Chronic kidney disease, stage 3a (HCC) 05/29/2022   Diabetes 1.5, managed as type 2 (HCC)    Diabetic retinopathy (HCC)    NPDR OU   Hypertension    Hypertensive retinopathy    OU   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   1-4. Severe Non-proliferative diabetic  retinopathy, both eyes  - delayed f/u 4+ months instead of 13-14 weeks  - delayed f/u -- 13 wks instead of 12  - delayed to f/u from 10-11 weeks to 18 weeks (10.03.23 - 02.06.24)  - last A1c was 5.6 on 12.30.24; 6.5 on 09/30/22, 7.2 on 02.22.24; 7.0 on 11.07.23; 7.4 on 06.13.23  - initial exam showed massive central DME OU and scattered IRH and IRMA - FA 6.5.19 with patches of capillary nonperfusion; extensive Mas with late leakage OU; no frank NV  - FA 01.02.20, shows improvement in late leaking microaneurysms OU - S/P IVA OD #1 (06.07.19), #2 (07.08.19), #3 (07.08.19) --  IVA resistance - S/P IVE OD #1 (09.03.19), #2 (10.03.19), #3 (11.04.19), #4 (12.02.19), #5 (01.02.20), #6 (02.07.20), #7 (03.16.20), #8 (05.20.20), #9 (06.22.20), #10 (07.27.20), #11(09.09.10), #12 (10.28.20), #13 (11.25.20), #14 (04.06.21) - sample, #15 (05.04.21) - sample, #16 (06.18.21), #17 (07.30.21), #18 (09.10.21), #19 (11.16.21), #20 (1.7.22), #21 (4.12.22), #22 (06.07.22), #23 (8.1.22), #24 (10.11.22), #25 (12.6.22), #26 (1.31.23), #27 (04.25.23), #28 (07.07.23), #29 (10.03.23), #30 (02.06.24), #31 (05.07.24), #32 (08.06.24), #33 (11.15.24) ==========================================================  - S/P IVA OS #1 (06.05.19), #2 (07.08.19), #3 (07.08.19), #4 (09.03.19) - S/P IVE OS #1 (10.03.19), #2 (11.04.19), #3 (12.02.19), #4 (01.02.20), #5 (02.07.20), #6 (03.16.20), #7 (05.20.20), #8 (06.22.20),#9 (07.27.20), #10 (09.09.20), #11 (10.28.20), #12 (11.25.20) -- IVA resistance =========================================================== - OCT shows OD: Mild Interval improvement in cystic changes and edema ST macula; trace ERM at 4+ months; OS: NFP, no IRF/SRF -- no DME  - BCVA: OD 20/25 - stable, OS 20/25 from 20/20  - recommend holding IVE OU today  - pt in agreement - Eylea  paperwork and benefits investigation started on 08.05.19 -- approved for 2024  - f/u in 4 months -- DFE/OCT/possible injection  5,6.  Hypertensive retinopathy OU  - discussed importance of tight BP control  - had some Bps in hypertensive emergency range (200s / 110s)  - BP improved post kidney transplant  - discussed likely contribution to DME  - monitor   - BP management per nephrology, PCP and cardiology            7. 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  - discussed diagnosis and progression  - not yet visually significant  - monitor for now  Ophthalmic Meds Ordered this visit:  No orders of the defined types were placed in this encounter.    No follow-ups on file.  There are no Patient Instructions on file for this visit.  This document serves as a record of services personally performed by Redell JUDITHANN Hans, MD, PhD. It was created on their behalf by Wanda GEANNIE Keens, COT an ophthalmic technician. The creation of this record is the  provider's dictation and/or activities during the visit.    Electronically signed by:  Wanda GEANNIE Keens, COT  12/05/23 12:04 PM    Redell JUDITHANN Hans, M.D., Ph.D. Diseases & Surgery of the Retina and Vitreous [facility]      Abbreviations: M myopia (nearsighted); A astigmatism; H hyperopia (farsighted); P presbyopia; Mrx spectacle prescription;  CTL contact lenses; OD right eye; OS left eye; OU both eyes  XT exotropia; ET esotropia; PEK punctate epithelial keratitis; PEE punctate epithelial erosions; DES dry eye syndrome; MGD meibomian gland dysfunction; ATs artificial tears; PFAT's preservative free artificial tears; NSC nuclear sclerotic cataract; PSC posterior subcapsular cataract; ERM epi-retinal membrane; PVD posterior vitreous detachment; RD retinal detachment; DM diabetes mellitus; DR diabetic retinopathy; NPDR non-proliferative diabetic retinopathy; PDR proliferative diabetic retinopathy; CSME clinically significant macular edema; DME diabetic  macular edema; dbh dot blot hemorrhages; CWS cotton wool spot; POAG primary open angle glaucoma; C/D cup-to-disc ratio; HVF humphrey visual field; GVF goldmann visual field; OCT optical coherence tomography; IOP intraocular pressure; BRVO Branch retinal vein occlusion; CRVO central retinal vein occlusion; CRAO central retinal artery occlusion; BRAO branch retinal artery occlusion; RT retinal tear; SB scleral buckle; PPV pars plana vitrectomy; VH Vitreous hemorrhage; PRP panretinal laser photocoagulation; IVK intravitreal kenalog; VMT vitreomacular traction; MH Macular hole;  NVD neovascularization of the disc; NVE neovascularization elsewhere; AREDS age related eye disease study; ARMD age related macular degeneration; POAG primary open angle glaucoma; EBMD epithelial/anterior basement membrane dystrophy; ACIOL anterior chamber intraocular lens; IOL intraocular lens; PCIOL posterior chamber intraocular lens; Phaco/IOL phacoemulsification with intraocular lens placement; PRK photorefractive keratectomy; LASIK laser assisted in situ keratomileusis; HTN hypertension; DM diabetes mellitus; COPD chronic obstructive pulmonary disease

## 2023-12-19 ENCOUNTER — Encounter (INDEPENDENT_AMBULATORY_CARE_PROVIDER_SITE_OTHER): Admitting: Ophthalmology

## 2023-12-19 DIAGNOSIS — H25813 Combined forms of age-related cataract, bilateral: Secondary | ICD-10-CM

## 2023-12-19 DIAGNOSIS — I10 Essential (primary) hypertension: Secondary | ICD-10-CM

## 2023-12-19 DIAGNOSIS — E113413 Type 2 diabetes mellitus with severe nonproliferative diabetic retinopathy with macular edema, bilateral: Secondary | ICD-10-CM

## 2023-12-19 DIAGNOSIS — Z7984 Long term (current) use of oral hypoglycemic drugs: Secondary | ICD-10-CM

## 2023-12-19 DIAGNOSIS — H35033 Hypertensive retinopathy, bilateral: Secondary | ICD-10-CM

## 2023-12-19 DIAGNOSIS — Z794 Long term (current) use of insulin: Secondary | ICD-10-CM

## 2023-12-19 DIAGNOSIS — Z7985 Long-term (current) use of injectable non-insulin antidiabetic drugs: Secondary | ICD-10-CM

## 2023-12-20 NOTE — Progress Notes (Signed)
 [facility] - Clinic Note  01/02/2024     CHIEF COMPLAINT Patient presents for Retina Follow Up  HISTORY OF PRESENT ILLNESS: Philip Wilson is a 53 y.o. male who presents to the clinic today for:   HPI     Retina Follow Up   Patient presents with  Diabetic Retinopathy.  In both eyes.  This started 4 months ago.  Duration of 4 months.  Since onset it is stable.        Comments   4 month retina follow up NPDR and I'VE OU pt is reporting no vision changes noticed he denies any flashes or floaters pt last reading 82 A1C 6.5      Last edited by Resa Delon ORN, COT on 01/02/2024  9:54 AM.     Patient states   Referring physician: Sherre Clapper, MD [address] [place],  [place] [address]  HISTORICAL INFORMATION:   Selected notes from the MEDICAL RECORD NUMBER Referred by Dr. Dasie Sow for diabetic retinopathy OU   CURRENT MEDICATIONS: No current outpatient medications on file. (Ophthalmic Drugs)   Current Facility-Administered Medications (Ophthalmic Drugs)  Medication Route   aflibercept  (EYLEA ) SOLN 2 mg Intravitreal   Current Outpatient Medications (Other)  Medication Sig   AgaMatrix Ultra-Thin Lancets MISC Check blood sugar fasting and once 2 hours after meal   aspirin 81 MG EC tablet Take by mouth.   atorvastatin  (LIPITOR) 40 MG tablet TAKE 1 TABLET(40 MG) BY MOUTH DAILY   BD PEN NEEDLE NANO 2ND GEN 32G X 4 MM MISC as directed   Blood Glucose Monitoring Suppl (FIFTY50 GLUCOSE METER 2.0) w/Device KIT 1 each by Other route 4 times daily. Use as instructed one touch ultra   glucose blood (ACCU-CHEK GUIDE) test strip Check blood sugar 3 times daily   insulin  aspart (NOVOLOG  FLEXPEN) 100 UNIT/ML FlexPen Inject 20 Units into the skin 3 (three) times daily with meals.   labetalol (NORMODYNE) 200 MG tablet Take 200 mg by mouth 2 (two) times daily.   losartan (COZAAR) 25 MG tablet Take 25 mg by mouth daily.   mycophenolate  (MYFORTIC) 180 MG EC tablet Take 360 mg by mouth 2 (two) times daily.   pantoprazole (PROTONIX) 40 MG tablet TAKE 1 TABLET(40 MG) BY MOUTH DAILY   predniSONE  (DELTASONE ) 5 MG tablet Take 1 tablet (5 mg total) by mouth daily with breakfast.   Semaglutide , 1 MG/DOSE, 4 MG/3ML SOPN Inject 1 mg as directed once a week.   sildenafil  (REVATIO ) 20 MG tablet Take 3 to 5 tablets 1 hour prior to intercourse.  No more than 24 hours.   tacrolimus  (PROGRAF ) 1 MG capsule Take 1 mg by mouth 2 (two) times daily.   TRESIBA  FLEXTOUCH 200 UNIT/ML FlexTouch Pen ADMINISTER 48 UNITS UNDER THE SKIN DAILY   valGANciclovir (VALCYTE) 450 MG tablet Indications: treatment to prevent cytomegalovirus disease   VASCEPA  1 g capsule TAKE 2 CAPSULES(2 GRAMS) BY MOUTH TWICE DAILY   Current Facility-Administered Medications (Other)  Medication Route   Bevacizumab  (AVASTIN ) SOLN 1.25 mg Intravitreal   REVIEW OF SYSTEMS: ROS   Positive for: Neurological, Genitourinary, Endocrine, Eyes Negative for: Constitutional, Gastrointestinal, Skin, Musculoskeletal, HENT, Cardiovascular, Respiratory, Psychiatric, Allergic/Imm, Heme/Lymph Last edited by Resa Delon ORN, COT on 01/02/2024  9:53 AM.      ALLERGIES No Known Allergies  PAST MEDICAL HISTORY Past Medical History:  Diagnosis Date   Cataract    OU   Chronic kidney disease 05/04/2018  Chronic kidney disease, stage 3a (HCC) 05/29/2022   Diabetes 1.5, managed as type 2 (HCC)    Diabetic retinopathy (HCC)    NPDR OU   Hypertension    Hypertensive retinopathy    OU   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  Added automatically from request for surgery [id number]     Past Surgical History:  Procedure Laterality Date   AV FISTULA PLACEMENT Left 09/28/2018   Procedure: Creation of Left arm Radiocephalic ARTERIOVENOUS (AV) FISTULA;  Surgeon: Gretta Lonni PARAS, MD;  Location: 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   MULTIPLE TOOTH EXTRACTIONS     FAMILY HISTORY Family History  Problem Relation Age of Onset   Heart failure Mother    SOCIAL HISTORY Social History   Tobacco Use   Smoking status: Former   Smokeless tobacco: Never   Tobacco comments:    quit 20 years ago  Vaping Use   Vaping status: Never Used  Substance Use Topics   Alcohol use: Not Currently   Drug use: Never       OPHTHALMIC EXAM:  Base Eye Exam     Visual Acuity (Snellen - Linear)       Right Left   Dist cc 20/25 -1 20/25 -3   Dist ph cc 20/20 -2 20/20 -3    Correction: Glasses         Tonometry (Tonopen, 10:01 AM)       Right Left   Pressure 12 14         Pupils       Pupils Dark Light Shape React APD   Right PERRL 3 2 Round Brisk None   Left PERRL 3 2 Round Brisk None         Visual Fields       Left Right    Full Full         Extraocular Movement       Right Left    Full, Ortho Full, Ortho         Neuro/Psych     Oriented x3: Yes   Mood/Affect: Normal         Dilation     Both eyes: 2.5% Phenylephrine @ 10:01 AM           Slit Lamp and Fundus Exam     Slit Lamp Exam       Right Left   Lids/Lashes Dermatochalasis - upper lid, mild Meibomian gland dysfunction Dermatochalasis - upper lid, mild Meibomian gland dysfunction   Conjunctiva/Sclera White and quiet White and quiet   Cornea Clear Clear   Anterior Chamber Deep and quiet, no cell or flare Deep and quiet, no cell or flare   Iris Round and dilated to 7mm, No NVI Round and dilated to 7mm, No NVI   Lens 2+ Nuclear sclerosis, 2+ Cortical cataract 2+ Nuclear sclerosis, 2+ Cortical cataract   Anterior Vitreous Mild Vitreous syneresis, Posterior vitreous detachment, Weiss ring Mild Vitreous syneresis, old white VH inferiorly         Fundus Exam       Right Left   Disc Pink and Sharp Pink and Sharp, Compact   C/D Ratio 0.2 0.3   Macula good foveal reflex,  mild ERM greatest superior to fovea, focal cystic changes ST macula -- stably improved no heme Flat, good foveal reflex, mild ERM, No heme or  edema   Vessels Mild attenuated, Tortuous attenuated, Tortuous   Periphery Attached, No RT/RD, no heme Attached, rare MA           IMAGING AND PROCEDURES  Imaging and Procedures for @TODAY @  OCT, Retina - OU - Both Eyes       Right Eye Quality was good. Central Foveal Thickness: 241. Progression has improved. Findings include no IRF, no SRF, abnormal foveal contour, retinal drusen , intraretinal hyper-reflective material, epiretinal membrane, macular pucker (Stable improvement in cystic changes and edema ST macula; trace ERM).   Left Eye Quality was good. Central Foveal Thickness: 248. Progression has been stable. Findings include normal foveal contour, no IRF, no SRF, intraretinal hyper-reflective material, epiretinal membrane (Stable improvement in trace cystic changes - no fluid/edema).   Notes *Images captured and stored on drive  Diagnosis / Impression:  OD: stable improvement in cystic changes and edema ST macula; trace ERM OS: NFP, no IRF/SRF -- no DME  Clinical management:  See below  Abbreviations: NFP - Normal foveal profile. CME - cystoid macular edema. PED - pigment epithelial detachment. IRF - intraretinal fluid. SRF - subretinal fluid. EZ - ellipsoid zone. ERM - epiretinal membrane. ORA - outer retinal atrophy. ORT - outer retinal tubulation. SRHM - subretinal hyper-reflective material             ASSESSMENT/PLAN:    ICD-10-CM   1. 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 Current use of insulin  (HCC)  Z79.4     3. Long-term (current) use of injectable non-insulin  antidiabetic drugs  Z79.85     4. Long term (current) use of oral hypoglycemic drugs  Z79.84     5. Essential hypertension   I10     6. Hypertensive retinopathy of both eyes  H35.033     7. Combined forms of age-related cataract of both eyes  H25.813      1-4. Severe Non-proliferative diabetic retinopathy, both eyes  - delayed f/u 4+ months instead of 13-14 weeks  - delayed f/u -- 13 wks instead of 12 - delayed to f/u from 10-11 weeks to 18 weeks (10.03.23 - 02.06.24) - last A1c was 5.6 on 12.30.24; 6.5 on 09/30/22, 7.2 on 02.22.24; 7.0 on 11.07.23; 7.4 on 06.13.23 - initial exam showed massive central DME OU and scattered IRH and IRMA - FA 6.5.19 with patches of capillary nonperfusion; extensive Mas with late leakage OU; no frank NV - FA 01.02.20, shows improvement in late leaking microaneurysms OU - S/P IVA OD #1 (06.07.19), #2 (07.08.19), #3 (07.08.19) --  IVA resistance - S/P IVA OS #1 (06.05.19), #2 (07.08.19), #3 (07.08.19), #4 (09.03.19)-- IVA resistance ========================= - S/P IVE OD #1 (09.03.19), #2 (10.03.19), #3 (11.04.19), #4 (12.02.19), #5 (01.02.20), #6 (02.07.20), #7 (03.16.20), #8 (05.20.20), #9 (06.22.20), #10 (07.27.20), #11(09.09.10), #12 (10.28.20), #13 (11.25.20), #14 (04.06.21) - sample, #15 (05.04.21) - sample, #16 (06.18.21), #17 (07.30.21), #18 (09.10.21), #19 (11.16.21), #20 (1.7.22), #21 (4.12.22), #22 (06.07.22), #23 (8.1.22), #24 (10.11.22), #25 (12.6.22), #26 (1.31.23), #27 (04.25.23), #28 (07.07.23), #29 (10.03.23), #30 (02.06.24), #31 (05.07.24), #32 (08.06.24), #33 (11.15.24) - S/P IVE OS #1 (10.03.19), #2 (11.04.19), #3 (12.02.19), #4 (01.02.20), #5 (02.07.20), #6 (03.16.20), #7 (05.20.20), #8 (06.22.20),#9 (07.27.20), #10 (09.09.20), #11 (10.28.20), #12 (11.25.20)  - OCT shows OD: Mild Interval improvement in cystic changes and edema ST macula; trace ERM at 4+ months; OS: Stable improvement  in trace cystic changes - no fluid/edema  - BCVA: OD 20/20 from 20/25 OS 20/20 from 20/25  - recommend holding IVE OU today  - pt in agreement - Eylea  paperwork and benefits  investigation started on 08.05.19 -- approved for 2024  - f/u in 6 months -- DFE/OCT/possible injection  5,6. Hypertensive retinopathy OU  - discussed importance of tight BP control  - had some Bps in hypertensive emergency range (200s / 110s)  - BP improved post kidney transplant  - discussed likely contribution to DME  - monitor   - BP management per nephrology, PCP and cardiology            7. 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  - discussed diagnosis and progression  - not yet visually significant  - monitor for now  Ophthalmic Meds Ordered this visit:  No orders of the defined types were placed in this encounter.    No follow-ups on file.  There are no Patient Instructions on file for this visit.  This document serves as a record of services personally performed by Redell JUDITHANN Hans, MD, PhD. It was created on their behalf by Wanda GEANNIE Keens, COT an ophthalmic technician. The creation of this record is the provider's dictation and/or activities during the visit.    Electronically signed by:  Wanda GEANNIE Keens, COT  01/02/24 10:46 AM  Redell JUDITHANN Hans, M.D., Ph.D. Diseases & Surgery of the Retina and Vitreous [facility]     Abbreviations: M myopia (nearsighted); A astigmatism; H hyperopia (farsighted); P presbyopia; Mrx spectacle prescription;  CTL contact lenses; OD right eye; OS left eye; OU both eyes  XT exotropia; ET esotropia; PEK punctate epithelial keratitis; PEE punctate epithelial erosions; DES dry eye syndrome; MGD meibomian gland dysfunction; ATs artificial tears; PFAT's preservative free artificial tears; NSC nuclear sclerotic cataract; PSC posterior subcapsular cataract; ERM epi-retinal membrane; PVD posterior vitreous detachment; RD retinal detachment; DM diabetes mellitus; DR diabetic retinopathy; NPDR non-proliferative diabetic  retinopathy; PDR proliferative diabetic retinopathy; CSME clinically significant macular edema; DME diabetic macular edema; dbh dot blot hemorrhages; CWS cotton wool spot; POAG primary open angle glaucoma; C/D cup-to-disc ratio; HVF humphrey visual field; GVF goldmann visual field; OCT optical coherence tomography; IOP intraocular pressure; BRVO Branch retinal vein occlusion; CRVO central retinal vein occlusion; CRAO central retinal artery occlusion; BRAO branch retinal artery occlusion; RT retinal tear; SB scleral buckle; PPV pars plana vitrectomy; VH Vitreous hemorrhage; PRP panretinal laser photocoagulation; IVK intravitreal kenalog; VMT vitreomacular traction; MH Macular hole;  NVD neovascularization of the disc; NVE neovascularization elsewhere; AREDS age related eye disease study; ARMD age related macular degeneration; POAG primary open angle glaucoma; EBMD epithelial/anterior basement membrane dystrophy; ACIOL anterior chamber intraocular lens; IOL intraocular lens; PCIOL posterior chamber intraocular lens; Phaco/IOL phacoemulsification with intraocular lens placement; PRK photorefractive keratectomy; LASIK laser assisted in situ keratomileusis; HTN hypertension; DM diabetes mellitus; COPD chronic obstructive pulmonary disease

## 2024-01-02 ENCOUNTER — Encounter (INDEPENDENT_AMBULATORY_CARE_PROVIDER_SITE_OTHER): Payer: Self-pay | Admitting: Ophthalmology

## 2024-01-02 ENCOUNTER — Ambulatory Visit (INDEPENDENT_AMBULATORY_CARE_PROVIDER_SITE_OTHER): Admitting: Ophthalmology

## 2024-01-02 DIAGNOSIS — E113413 Type 2 diabetes mellitus with severe nonproliferative diabetic retinopathy with macular edema, bilateral: Secondary | ICD-10-CM | POA: Diagnosis not present

## 2024-01-02 DIAGNOSIS — Z794 Long term (current) use of insulin: Secondary | ICD-10-CM

## 2024-01-02 DIAGNOSIS — H35033 Hypertensive retinopathy, bilateral: Secondary | ICD-10-CM

## 2024-01-02 DIAGNOSIS — Z7985 Long-term (current) use of injectable non-insulin antidiabetic drugs: Secondary | ICD-10-CM | POA: Diagnosis not present

## 2024-01-02 DIAGNOSIS — I10 Essential (primary) hypertension: Secondary | ICD-10-CM

## 2024-01-02 DIAGNOSIS — Z7984 Long term (current) use of oral hypoglycemic drugs: Secondary | ICD-10-CM

## 2024-01-02 DIAGNOSIS — H25813 Combined forms of age-related cataract, bilateral: Secondary | ICD-10-CM

## 2024-01-04 ENCOUNTER — Encounter (INDEPENDENT_AMBULATORY_CARE_PROVIDER_SITE_OTHER): Payer: Self-pay | Admitting: Ophthalmology

## 2024-01-04 LAB — LAB REPORT - SCANNED
EGFR: 61
HM Hepatitis Screen: NEGATIVE

## 2024-01-07 ENCOUNTER — Ambulatory Visit: Payer: Self-pay | Admitting: Family Medicine

## 2024-02-06 ENCOUNTER — Other Ambulatory Visit: Payer: Self-pay | Admitting: Family Medicine

## 2024-02-06 DIAGNOSIS — E1121 Type 2 diabetes mellitus with diabetic nephropathy: Secondary | ICD-10-CM

## 2024-02-10 ENCOUNTER — Other Ambulatory Visit: Payer: Self-pay | Admitting: Family Medicine

## 2024-02-10 DIAGNOSIS — E119 Type 2 diabetes mellitus without complications: Secondary | ICD-10-CM

## 2024-02-18 NOTE — Assessment & Plan Note (Addendum)
 Cholesterol well-controlled. Total cholesterol <100 mg/dL, HDL 43 mg/dL, LDL undetectable. - Continue atorvastatin  40 mg three times a week. - Continue Vascepa  1 gram, two capsules twice daily.  Orders:   POCT Lipid Panel   atorvastatin  (LIPITOR) 40 MG tablet; Monday, Wednesday and Friday.

## 2024-02-18 NOTE — Assessment & Plan Note (Addendum)
 The current medical regimen to prevent rejection of kidney.

## 2024-02-18 NOTE — Assessment & Plan Note (Addendum)
 Gastroesophageal reflux disease Switched to famotidine by nephrologist. - Continue famotidine as prescribed.

## 2024-02-18 NOTE — Assessment & Plan Note (Addendum)
 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 Occasional hypoglycemia due to skipped meals. On immunosuppressants post-transplant. Hepatitis B immunity unconfirmed. - Continue Ozempic  1 mg weekly. - Continue Tresiba  48 units daily. - Use Novolog  sliding scale for hyperglycemia. - Discuss hepatitis B vaccination with nephrologist.  Orders:   POCT glycosylated hemoglobin (Hb A1C)

## 2024-02-18 NOTE — Progress Notes (Signed)
 Subjective:  Patient ID: Philip Wilson, male    DOB: 10-18-1970  Age: 53 y.o. MRN: [id number]  Chief Complaint  Patient presents with   Medical Management of Chronic Issues    HPI: Discussed the use of AI scribe software for clinical note transcription with the patient, who gave verbal consent to proceed.  History of Present Illness Philip Wilson is a 53 year old male with diabetes, high cholesterol, and a history of kidney transplant who presents for routine follow-up.  Glycemic control and hypoglycemia - Diabetes managed with Ozempic  1 mg weekly and Tresiba  48 units daily - Previously took Ozempic  2 mg, reduced to 1 mg due to weight loss - Rarely uses Novolog , only on a sliding scale basis - Occasional hypoglycemic episodes, approximately once every two weeks, often related to skipping meals - Recent A1c improved from 6.5 to 5.4 - Diet high in protein and starch, minimal vegetable intake, eating less overall but feels satisfied  Dyslipidemia - Hyperlipidemia managed with Vascepa  1 gram, two capsules twice daily, and atorvastatin  40 mg three times a week - Recent cholesterol labs show triglycerides decreased from 125 to 80 - LDL levels are very low  Renal transplant status and immunosuppression - History of kidney transplant - Immunosuppression regimen includes Myfortic, Prograf , and prednisone  5 mg daily  Gastroesophageal reflux symptoms - Previously on pantoprazole for acid reflux, switched to famotidine by nephrologist  Diabetic retinopathy - Previously received Eylea  injections for diabetic retinopathy, now discontinued - Ophthalmology follow-up interval extended from every three months to every six months  Physical activity - No current exercise routine despite access to gym through daughter  Constitutional and respiratory symptoms - No fevers, chills, sweats, earaches, sore throat, stuffy nose, or chest pain  Immunization status - Received COVID and  influenza vaccines, with COVID vaccine not recorded in immunization registry - Received hepatitis B vaccines prior to transplant, but recent serology indicates possible lack of immunity       02/19/2024    9:15 AM 11/08/2023    1:39 PM 01/19/2023    9:11 AM 05/26/2022    8:10 AM 03/23/2021   10:48 AM  Depression screen PHQ 2/9  Decreased Interest 0 0 0 0 0  Down, Depressed, Hopeless 0 1 0 0 0  PHQ - 2 Score 0 1 0 0 0  Altered sleeping  1 0 0   Tired, decreased energy  1 0 0   Change in appetite  0 0 0   Feeling bad or failure about yourself   0 0 0   Trouble concentrating  0 0 0   Moving slowly or fidgety/restless  0 0 0   Suicidal thoughts  0 0 0   PHQ-9 Score  3  0  0    Difficult doing work/chores  Not difficult at all Not difficult at all Not difficult at all      Data saved with a previous flowsheet row definition        02/19/2024    9:15 AM  Fall Risk   Falls in the past year? 0  Number falls in past yr: 0  Injury with Fall? 0  Risk for fall due to : No Fall Risks  Follow up Falls evaluation completed    Patient Care Team: Sherre Clapper, MD as PCP - General (Family Medicine) Jerrye Burnard HERO, NP as Nurse Practitioner (Nephrology)   Review of Systems  Constitutional:  Negative for appetite change, fatigue and fever.  HENT:  Negative for congestion,  ear pain, sinus pressure and sore throat.   Respiratory:  Negative for cough, chest tightness, shortness of breath and wheezing.   Cardiovascular:  Negative for chest pain and palpitations.  Gastrointestinal:  Negative for abdominal pain, constipation, diarrhea, nausea and vomiting.  Genitourinary:  Negative for dysuria and hematuria.  Musculoskeletal:  Negative for arthralgias, back pain, joint swelling and myalgias.  Skin:  Negative for rash.  Neurological:  Negative for dizziness, weakness and headaches.  Psychiatric/Behavioral:  Negative for dysphoric mood. The patient is not nervous/anxious.     Current  Outpatient Medications on File Prior to Visit  Medication Sig Dispense Refill   ACCU-CHEK GUIDE TEST test strip CHECK BLOOD SUGAR THREE TIMES DAILY 100 strip 6   AgaMatrix Ultra-Thin Lancets MISC Check blood sugar fasting and once 2 hours after meal 100 each 5   aspirin 81 MG EC tablet Take by mouth.     BD PEN NEEDLE NANO 2ND GEN 32G X 4 MM MISC as directed 100 each 3   Blood Glucose Monitoring Suppl (FIFTY50 GLUCOSE METER 2.0) w/Device KIT 1 each by Other route 4 times daily. Use as instructed one touch ultra     famotidine (PEPCID) 20 MG tablet Take 20 mg by mouth daily.     insulin  aspart (NOVOLOG  FLEXPEN) 100 UNIT/ML FlexPen Inject 20 Units into the skin 3 (three) times daily with meals. 54 mL 3   labetalol (NORMODYNE) 200 MG tablet Take 200 mg by mouth 2 (two) times daily.     losartan (COZAAR) 25 MG tablet Take 25 mg by mouth daily.     mycophenolate (MYFORTIC) 180 MG EC tablet Take 360 mg by mouth 2 (two) times daily.     Semaglutide , 1 MG/DOSE, (OZEMPIC , 1 MG/DOSE,) 4 MG/3ML SOPN INJECT 1 MG SUBCUTANEOUS ONCE WEEKLY 3 mL 1   sildenafil  (REVATIO ) 20 MG tablet Take 3 to 5 tablets 1 hour prior to intercourse.  No more than 24 hours. 50 tablet 0   tacrolimus  (PROGRAF ) 1 MG capsule Take 1 mg by mouth 2 (two) times daily.     TRESIBA  FLEXTOUCH 200 UNIT/ML FlexTouch Pen ADMINISTER 48 UNITS UNDER THE SKIN DAILY 9 mL 1   VASCEPA  1 g capsule TAKE 2 CAPSULES(2 GRAMS) BY MOUTH TWICE DAILY 360 capsule 0   Current Facility-Administered Medications on File Prior to Visit  Medication Dose Route Frequency Provider Last Rate Last Admin   Bevacizumab  (AVASTIN ) SOLN 1.25 mg  1.25 mg Intravitreal  Zamora, Brian, MD   1.25 mg at 09/06/17 1312   Past Medical History:  Diagnosis Date   Cataract    OU   Chronic kidney disease 05/04/2018   Chronic kidney disease, stage 3a (HCC) 05/29/2022   Diabetes 1.5, managed as type 2 (HCC)    Diabetic retinopathy (HCC)    NPDR OU   Hypertension    Hypertensive  retinopathy    OU   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  Added automatically from request for surgery [id number]     Past Surgical History:  Procedure Laterality Date   AV FISTULA PLACEMENT Left 09/28/2018   Procedure: Creation of Left arm Radiocephalic ARTERIOVENOUS (AV) FISTULA;  Surgeon: Gretta Lonni PARAS, MD;  Location: [facility] OR;  Service: Vascular;  Laterality: Left;   KIDNEY TRANSPLANT  04/02/2019   MULTIPLE TOOTH EXTRACTIONS      Family History  Problem Relation Age of Onset   Heart failure Mother  Social History   Socioeconomic History   Marital status: Married    Spouse name: Not on file   Number of children: Not on file   Years of education: Not on file   Highest education level: Not on file  Occupational History   Occupation: sales  Tobacco Use   Smoking status: Former   Smokeless tobacco: Never   Tobacco comments:    quit 20 years ago  Vaping Use   Vaping status: Never Used  Substance and Sexual Activity   Alcohol use: Not Currently   Drug use: Never   Sexual activity: Yes    Partners: Female  Other Topics Concern   Not on file  Social History Narrative   Not on file   Social Drivers of Health   Financial Resource Strain: Low Risk  (05/26/2022)   Overall Financial Resource Strain (CARDIA)    Difficulty of Paying Living Expenses: Not hard at all  Food Insecurity: No Food Insecurity (06/07/2023)   Hunger Vital Sign    Worried About Running Out of Food in the Last Year: Never true    Ran Out of Food in the Last Year: Never true  Transportation Needs: No Transportation Needs (06/07/2023)   PRAPARE - Administrator, Civil Service (Medical): No    Lack of Transportation (Non-Medical): No  Physical Activity: Inactive (05/26/2022)   Exercise Vital Sign    Days of Exercise per Week: 0 days    Minutes of Exercise per Session: 0 min  Stress: No Stress Concern Present (05/26/2022)    [employer] of Occupational Health - Occupational Stress Questionnaire    Feeling of Stress : Not at all  Social Connections: Moderately Integrated (05/26/2022)   Social Connection and Isolation Panel    Frequency of Communication with Friends and Family: Twice a week    Frequency of Social Gatherings with Friends and Family: Twice a week    Attends Religious Services: 1 to 4 times per year    Active Member of [employer] or Organizations: No    Attends Engineer, Structural: Never    Marital Status: Married    Objective:  BP 108/68 (BP Location: Right Arm, Patient Position: Sitting)   Pulse 84   Temp 97.6 F (36.4 C) (Temporal)   Ht 5' 10 (1.778 m)   Wt 176 lb (79.8 kg)   SpO2 99%   BMI 25.25 kg/m      02/19/2024    9:10 AM 11/08/2023    1:28 PM 06/07/2023    1:54 PM  BP/Weight  Systolic BP 108 118 120  Diastolic BP 68 68 68  Wt. (Lbs) 176 192 160  BMI 25.25 kg/m2 27.55 kg/m2 22.96 kg/m2    Physical Exam Vitals reviewed.  Constitutional:      Appearance: Normal appearance.  Neck:     Vascular: No carotid bruit.  Cardiovascular:     Rate and Rhythm: Normal rate and regular rhythm.     Pulses: Normal pulses.     Heart sounds: Normal heart sounds.  Pulmonary:     Effort: Pulmonary effort is normal.     Breath sounds: Normal breath sounds. No wheezing, rhonchi or rales.  Abdominal:     General: Bowel sounds are normal.     Palpations: Abdomen is soft.     Tenderness: There is no abdominal tenderness.  Skin:    Findings: Rash present.  Neurological:     Mental Status: He is alert.  Psychiatric:  Mood and Affect: Mood normal.        Behavior: Behavior normal.      Diabetic foot exam was performed with the following findings:   No deformities, ulcerations, or other skin breakdown Normal sensation of 10g monofilament Intact posterior tibialis and dorsalis pedis pulses      Lab Results  Component Value Date   WBC 12.1 (H) 11/08/2023   HGB  13.6 11/08/2023   HCT 41.7 11/08/2023   PLT 266 11/08/2023   GLUCOSE 46 (L) 11/08/2023   CHOL 159 11/08/2023   TRIG 125 11/08/2023   HDL 65 11/08/2023   LDLCALC 72 11/08/2023   ALT 36 11/08/2023   AST 37 11/08/2023   NA 140 11/08/2023   K 3.9 11/08/2023   CL 104 11/08/2023   CREATININE 1.47 (H) 11/08/2023   BUN 23 11/08/2023   CO2 20 11/08/2023   INR 1.0 05/29/2018   HGBA1C 5.4 02/19/2024    Results for orders placed or performed in visit on 02/19/24  POCT Lipid Panel   Collection Time: 02/19/24  9:17 AM  Result Value Ref Range   TC <100    HDL 43    TRG 80    LDL N/A    Non-HDL N/A    TC/HDL N/A   POCT glycosylated hemoglobin (Hb A1C)   Collection Time: 02/19/24  9:17 AM  Result Value Ref Range   Hemoglobin A1C     HbA1c POC (<> result, manual entry) 5.4 4.0 - 5.6 %   HbA1c, POC (prediabetic range)     HbA1c, POC (controlled diabetic range)    .  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 Occasional hypoglycemia due to skipped meals. On immunosuppressants post-transplant. Hepatitis B immunity unconfirmed. - Continue Ozempic  1 mg weekly. - Continue Tresiba  48 units daily. - Use Novolog  sliding scale for hyperglycemia. - Discuss hepatitis B vaccination with nephrologist.  Orders:   POCT glycosylated hemoglobin (Hb A1C)  Mixed hyperlipidemia Cholesterol well-controlled. Total cholesterol <100 mg/dL, HDL 43 mg/dL, LDL undetectable. - Continue atorvastatin  40 mg three times a week. - Continue Vascepa  1 gram, two capsules twice daily.  Orders:   POCT Lipid Panel   atorvastatin  (LIPITOR) 40 MG tablet; Monday, Wednesday and Friday.  Hypertensive kidney disease, benign, stage 1-4 or unspecified chronic kidney disease Blood pressure well-controlled at 108/68 mmHg. - Continue labetalol 200 mg daily. -  Continue losartan 25 mg daily.    GERD without esophagitis Gastroesophageal reflux disease Switched to famotidine by nephrologist. - Continue famotidine as prescribed.    Immunosuppression The current medical regimen to prevent rejection of kidney.        Body mass index is 25.25 kg/m.   Meds ordered this encounter  Medications   atorvastatin  (LIPITOR) 40 MG tablet    Sig: Monday, Wednesday and Friday.    Dispense:  90 tablet    Refill:  0   predniSONE  (DELTASONE ) 5 MG tablet    Sig: Take 1 tablet (5 mg total) by mouth daily with breakfast.   Selenium  Sulfide 2.25 % SHAM    Sig: Apply 2.25% shampoo topically to wetted areas to be cleansed and work into a full lather; allow to remain on skin for 10 minutes and then rinse thoroughly and pat dry. Repeat daily for 7 days    Dispense:  180 mL    Refill:  0  Orders Placed This Encounter  Procedures   POCT Lipid Panel   POCT glycosylated hemoglobin (Hb A1C)       Follow-up: Return in about 1 month (around 03/20/2024) for chronic follow up.  An After Visit Summary was printed and given to the patient.   Abigail Free, MD [facility] [phone]

## 2024-02-18 NOTE — Assessment & Plan Note (Addendum)
 Blood pressure well-controlled at 108/68 mmHg. - Continue labetalol 200 mg daily. - Continue losartan 25 mg daily.

## 2024-02-19 ENCOUNTER — Ambulatory Visit: Admitting: Family Medicine

## 2024-02-19 ENCOUNTER — Encounter: Payer: Self-pay | Admitting: Family Medicine

## 2024-02-19 VITALS — BP 108/68 | HR 84 | Temp 97.6°F | Ht 70.0 in | Wt 176.0 lb

## 2024-02-19 DIAGNOSIS — K219 Gastro-esophageal reflux disease without esophagitis: Secondary | ICD-10-CM

## 2024-02-19 DIAGNOSIS — I129 Hypertensive chronic kidney disease with stage 1 through stage 4 chronic kidney disease, or unspecified chronic kidney disease: Secondary | ICD-10-CM

## 2024-02-19 DIAGNOSIS — E1121 Type 2 diabetes mellitus with diabetic nephropathy: Secondary | ICD-10-CM

## 2024-02-19 DIAGNOSIS — D849 Immunodeficiency, unspecified: Secondary | ICD-10-CM

## 2024-02-19 DIAGNOSIS — E782 Mixed hyperlipidemia: Secondary | ICD-10-CM

## 2024-02-19 LAB — POCT LIPID PANEL
HDL: 43
TC: <100
TRG: 80

## 2024-02-19 LAB — POCT GLYCOSYLATED HEMOGLOBIN (HGB A1C): HbA1c POC (<> result, manual entry): 5.4 % (ref 4.0–5.6)

## 2024-02-19 MED ORDER — SELENIUM SULFIDE 2.25 % EX SHAM: MEDICATED_SHAMPOO | CUTANEOUS | 0 refills | Status: AC

## 2024-02-19 MED ORDER — PREDNISONE 5 MG PO TABS: 5.0000 mg | ORAL_TABLET | Freq: Every day | ORAL | Status: AC

## 2024-02-19 MED ORDER — ATORVASTATIN CALCIUM 40 MG PO TABS
ORAL_TABLET | ORAL | 0 refills | Status: AC
Start: 2024-02-19 — End: ?

## 2024-02-19 NOTE — Patient Instructions (Signed)
  VISIT SUMMARY: Today, we reviewed your diabetes, cholesterol, kidney transplant status, and other health concerns. Your diabetes is well-controlled, and your cholesterol levels have improved. We also discussed your immunization status and general health maintenance.  YOUR PLAN: TYPE 2 DIABETES MELLITUS WITH DIABETIC NEPHROPATHY AND KIDNEY TRANSPLANT ON IMMUNOSUPPRESSION: Your diabetes is well-controlled with an A1c of 5.4. You occasionally experience low blood sugar due to skipped meals. You are on immunosuppressants following your kidney transplant, and your hepatitis B immunity is unconfirmed. -Continue taking Ozempic  1 mg weekly. -Continue taking Tresiba  48 units daily. -Use Novolog  on a sliding scale for high blood sugar. -Discuss hepatitis B vaccination with your nephrologist.  HYPERTENSIVE CHRONIC KIDNEY DISEASE: Your blood pressure is well-controlled at 108/68 mmHg. -Continue taking labetalol 200 mg daily. -Continue taking losartan 25 mg daily.  MIXED HYPERLIPIDEMIA: Your cholesterol levels are well-controlled with very low LDL levels. -Continue taking atorvastatin  40 mg three times a week. -Continue taking Vascepa  1 gram, two capsules twice daily.  GASTROESOPHAGEAL REFLUX DISEASE: You have been switched to famotidine for acid reflux by your nephrologist. -Continue taking famotidine as prescribed.  GENERAL HEALTH MAINTENANCE: You are up to date on most vaccinations, but your hepatitis B immunity is unconfirmed. Colon cancer screening is pending. -Complete colon cancer screening within two weeks. -Discuss hepatitis B vaccination with your nephrologist.                      Contains text generated by [employer]bridge.                                 Contains text generated by Abridge.

## 2024-03-08 ENCOUNTER — Other Ambulatory Visit: Payer: Self-pay | Admitting: Family Medicine

## 2024-03-08 DIAGNOSIS — K2100 Gastro-esophageal reflux disease with esophagitis, without bleeding: Secondary | ICD-10-CM

## 2024-03-10 ENCOUNTER — Encounter: Payer: Self-pay | Admitting: Family Medicine

## 2024-04-05 ENCOUNTER — Other Ambulatory Visit: Payer: Self-pay | Admitting: Family Medicine

## 2024-04-05 DIAGNOSIS — E1121 Type 2 diabetes mellitus with diabetic nephropathy: Secondary | ICD-10-CM

## 2024-04-30 ENCOUNTER — Encounter: Payer: Self-pay | Admitting: Family Medicine

## 2024-06-18 ENCOUNTER — Ambulatory Visit: Admitting: Family Medicine

## 2024-07-02 ENCOUNTER — Encounter (INDEPENDENT_AMBULATORY_CARE_PROVIDER_SITE_OTHER): Admitting: Ophthalmology
